# Patient Record
Sex: Female | Born: 1970 | Race: Black or African American | Hispanic: No | Smoking: Former smoker
Health system: Southern US, Community
[De-identification: ages and names within clinical notes are randomized; demographics above are authoritative.]

## PROBLEM LIST (undated history)

## (undated) DIAGNOSIS — K219 Gastro-esophageal reflux disease without esophagitis: Secondary | ICD-10-CM

## (undated) DIAGNOSIS — I10 Essential (primary) hypertension: Secondary | ICD-10-CM

## (undated) DIAGNOSIS — G43909 Migraine, unspecified, not intractable, without status migrainosus: Secondary | ICD-10-CM

## (undated) DIAGNOSIS — R310 Gross hematuria: Secondary | ICD-10-CM

## (undated) DIAGNOSIS — R12 Heartburn: Secondary | ICD-10-CM

## (undated) DIAGNOSIS — K5790 Diverticulosis of intestine, part unspecified, without perforation or abscess without bleeding: Secondary | ICD-10-CM

## (undated) DIAGNOSIS — J45909 Unspecified asthma, uncomplicated: Secondary | ICD-10-CM

## (undated) HISTORY — PX: CARPAL TUNNEL RELEASE: SHX101

## (undated) HISTORY — PX: CHOLECYSTECTOMY: SHX55

## (undated) HISTORY — DX: Heartburn: R12

## (undated) HISTORY — DX: Gross hematuria: R31.0

## (undated) HISTORY — DX: Unspecified asthma, uncomplicated: J45.909

## (undated) HISTORY — PX: ABDOMINAL HYSTERECTOMY: SHX81

---

## 2004-10-14 ENCOUNTER — Ambulatory Visit: Payer: Self-pay | Admitting: Orthopedic Surgery

## 2004-11-14 ENCOUNTER — Ambulatory Visit: Payer: Self-pay | Admitting: Orthopedic Surgery

## 2005-10-19 ENCOUNTER — Observation Stay: Payer: Self-pay | Admitting: Obstetrics and Gynecology

## 2005-11-04 ENCOUNTER — Observation Stay: Payer: Self-pay

## 2005-11-08 ENCOUNTER — Inpatient Hospital Stay: Payer: Self-pay | Admitting: Obstetrics and Gynecology

## 2005-12-22 ENCOUNTER — Ambulatory Visit: Payer: Self-pay | Admitting: Obstetrics and Gynecology

## 2006-11-26 ENCOUNTER — Ambulatory Visit: Payer: Self-pay | Admitting: Certified Nurse Midwife

## 2010-02-01 ENCOUNTER — Emergency Department (HOSPITAL_COMMUNITY): Admission: EM | Admit: 2010-02-01 | Discharge: 2010-02-01 | Payer: Self-pay | Admitting: Emergency Medicine

## 2010-02-08 ENCOUNTER — Inpatient Hospital Stay (HOSPITAL_COMMUNITY): Admission: EM | Admit: 2010-02-08 | Discharge: 2010-02-09 | Payer: Self-pay | Admitting: Emergency Medicine

## 2010-10-01 LAB — DIFFERENTIAL
Basophils Relative: 0 % (ref 0–1)
Eosinophils Absolute: 0.2 10*3/uL (ref 0.0–0.7)
Eosinophils Absolute: 0.3 10*3/uL (ref 0.0–0.7)
Eosinophils Relative: 0 % (ref 0–5)
Eosinophils Relative: 2 % (ref 0–5)
Eosinophils Relative: 2 % (ref 0–5)
Lymphocytes Relative: 14 % (ref 12–46)
Lymphocytes Relative: 5 % — ABNORMAL LOW (ref 12–46)
Lymphs Abs: 1 10*3/uL (ref 0.7–4.0)
Lymphs Abs: 1.6 10*3/uL (ref 0.7–4.0)
Monocytes Absolute: 0.1 10*3/uL (ref 0.1–1.0)
Monocytes Absolute: 0.4 10*3/uL (ref 0.1–1.0)
Monocytes Relative: 1 % — ABNORMAL LOW (ref 3–12)
Monocytes Relative: 2 % — ABNORMAL LOW (ref 3–12)
Monocytes Relative: 6 % (ref 3–12)
Neutro Abs: 8.9 10*3/uL — ABNORMAL HIGH (ref 1.7–7.7)
Neutrophils Relative %: 79 % — ABNORMAL HIGH (ref 43–77)

## 2010-10-01 LAB — SALICYLATE LEVEL: Salicylate Lvl: <4 mg/dL (ref 2.8–20.0)

## 2010-10-01 LAB — POCT CARDIAC MARKERS
CKMB, poc: <1 ng/mL — ABNORMAL LOW (ref 1.0–8.0)
Troponin i, poc: <0.05 ng/mL (ref 0.00–0.09)
Troponin i, poc: <0.05 ng/mL (ref 0.00–0.09)

## 2010-10-01 LAB — BASIC METABOLIC PANEL
BUN: 13 mg/dL (ref 6–23)
BUN: 14 mg/dL (ref 6–23)
CO2: 27 mEq/L (ref 19–32)
Calcium: 9 mg/dL (ref 8.4–10.5)
Chloride: 103 mEq/L (ref 96–112)
Chloride: 106 mEq/L (ref 96–112)
Chloride: 107 mEq/L (ref 96–112)
Creatinine, Ser: 0.78 mg/dL (ref 0.4–1.2)
GFR calc Af Amer: >60 mL/min (ref >60)
GFR calc Af Amer: >60 mL/min (ref >60)
Glucose, Bld: 114 mg/dL — ABNORMAL HIGH (ref 70–99)
Potassium: 3.6 mEq/L (ref 3.5–5.1)
Sodium: 136 mEq/L (ref 135–145)

## 2010-10-01 LAB — MRSA PCR SCREENING: MRSA by PCR: NEGATIVE

## 2010-10-01 LAB — CBC
HCT: 31.7 % — ABNORMAL LOW (ref 36.0–46.0)
Hemoglobin: 10.6 g/dL — ABNORMAL LOW (ref 12.0–15.0)
MCH: 28.3 pg (ref 26.0–34.0)
MCH: 28.3 pg (ref 26.0–34.0)
MCHC: 33.7 g/dL (ref 30.0–36.0)
MCV: 83.7 fL (ref 78.0–100.0)
MCV: 84.1 fL (ref 78.0–100.0)
MCV: 84.5 fL (ref 78.0–100.0)
Platelets: 308 10*3/uL (ref 150–400)
RBC: 3.75 MIL/uL — ABNORMAL LOW (ref 3.87–5.11)
RBC: 3.94 MIL/uL (ref 3.87–5.11)
WBC: 11.3 10*3/uL — ABNORMAL HIGH (ref 4.0–10.5)
WBC: 13.8 10*3/uL — ABNORMAL HIGH (ref 4.0–10.5)
WBC: 22.6 10*3/uL — ABNORMAL HIGH (ref 4.0–10.5)

## 2010-10-01 LAB — URINE CULTURE

## 2010-10-01 LAB — GLUCOSE, CAPILLARY: Glucose-Capillary: 212 mg/dL — ABNORMAL HIGH (ref 70–99)

## 2010-10-01 LAB — BLOOD GAS, ARTERIAL
Acid-base deficit: 3.5 mmol/L — ABNORMAL HIGH (ref 0.0–2.0)
Bicarbonate: 20.3 mEq/L (ref 20.0–24.0)
O2 Content: 21 L/min
O2 Saturation: 95.2 %
Patient temperature: 37
pH, Arterial: 7.413 — ABNORMAL HIGH (ref 7.350–7.400)
pO2, Arterial: 72.9 mmHg — ABNORMAL LOW (ref 80.0–100.0)

## 2010-10-01 LAB — URINALYSIS, ROUTINE W REFLEX MICROSCOPIC
Bilirubin Urine: NEGATIVE
Urobilinogen, UA: 0.2 mg/dL (ref 0.0–1.0)
pH: 5.5 (ref 5.0–8.0)

## 2010-10-01 LAB — RAPID URINE DRUG SCREEN, HOSP PERFORMED
Amphetamines: NOT DETECTED
Cocaine: NOT DETECTED

## 2010-10-01 LAB — URINE MICROSCOPIC-ADD ON

## 2010-10-01 LAB — ACETAMINOPHEN LEVEL: Acetaminophen (Tylenol), Serum: <10 ug/mL — ABNORMAL LOW (ref 10–30)

## 2010-10-01 LAB — D-DIMER, QUANTITATIVE: D-Dimer, Quant: 1.17 ug/mL-FEU — ABNORMAL HIGH (ref 0.00–0.48)

## 2011-01-16 ENCOUNTER — Emergency Department (HOSPITAL_COMMUNITY)
Admission: EM | Admit: 2011-01-16 | Discharge: 2011-01-17 | Disposition: A | Discharge status: Home / Self Care | Payer: Medicaid Other | Attending: Emergency Medicine | Admitting: Emergency Medicine

## 2011-01-16 DIAGNOSIS — R109 Unspecified abdominal pain: Secondary | ICD-10-CM | POA: Insufficient documentation

## 2011-01-16 DIAGNOSIS — Z79899 Other long term (current) drug therapy: Secondary | ICD-10-CM | POA: Insufficient documentation

## 2011-01-16 LAB — CBC
MCH: 27.3 pg (ref 26.0–34.0)
Platelets: 349 10*3/uL (ref 150–400)
RBC: 4.18 MIL/uL (ref 3.87–5.11)
RDW: 14.6 % (ref 11.5–15.5)
WBC: 8.7 10*3/uL (ref 4.0–10.5)

## 2011-01-16 LAB — DIFFERENTIAL
Basophils Relative: 0 % (ref 0–1)
Eosinophils Absolute: 0.2 10*3/uL (ref 0.0–0.7)
Eosinophils Relative: 2 % (ref 0–5)
Neutrophils Relative %: 56 % (ref 43–77)

## 2011-01-16 LAB — BASIC METABOLIC PANEL
Chloride: 102 mEq/L (ref 96–112)
Creatinine, Ser: 0.88 mg/dL (ref 0.50–1.10)
GFR calc Af Amer: >60 mL/min (ref >60)
GFR calc non Af Amer: >60 mL/min (ref >60)

## 2011-01-16 LAB — URINALYSIS, ROUTINE W REFLEX MICROSCOPIC
Bilirubin Urine: NEGATIVE
Ketones, ur: NEGATIVE mg/dL
Leukocytes, UA: NEGATIVE
Nitrite: NEGATIVE
Protein, ur: NEGATIVE mg/dL
Urobilinogen, UA: 0.2 mg/dL (ref 0.0–1.0)
pH: 6 (ref 5.0–8.0)

## 2011-01-16 LAB — POCT PREGNANCY, URINE: Preg Test, Ur: NEGATIVE

## 2011-01-16 LAB — URINE MICROSCOPIC-ADD ON

## 2011-08-08 ENCOUNTER — Ambulatory Visit: Payer: Self-pay | Admitting: Obstetrics and Gynecology

## 2011-08-08 LAB — BASIC METABOLIC PANEL
Anion Gap: 7 (ref 7–16)
Calcium, Total: 8.6 mg/dL (ref 8.5–10.1)
Chloride: 105 mmol/L (ref 98–107)
Co2: 28 mmol/L (ref 21–32)
Glucose: 106 mg/dL — ABNORMAL HIGH (ref 65–99)
Potassium: 4 mmol/L (ref 3.5–5.1)
Sodium: 140 mmol/L (ref 136–145)

## 2011-08-08 LAB — HEMOGLOBIN: HGB: 10.9 g/dL — ABNORMAL LOW (ref 12.0–16.0)

## 2011-08-14 ENCOUNTER — Ambulatory Visit: Payer: Self-pay | Admitting: Obstetrics and Gynecology

## 2011-08-14 HISTORY — PX: TOTAL VAGINAL HYSTERECTOMY: SHX2548

## 2011-08-14 LAB — PREGNANCY, URINE: Pregnancy Test, Urine: NEGATIVE m[IU]/mL

## 2011-08-15 LAB — BASIC METABOLIC PANEL
Chloride: 101 mmol/L (ref 98–107)
Co2: 27 mmol/L (ref 21–32)
Glucose: 102 mg/dL — ABNORMAL HIGH (ref 65–99)
Potassium: 3.8 mmol/L (ref 3.5–5.1)

## 2011-08-16 LAB — PATHOLOGY REPORT

## 2014-11-08 NOTE — Discharge Summary (Signed)
PATIENT NAME:  Mckenzie Anderson, Mckenzie Anderson MR#:  [id number] DATE OF BIRTH:  November 02, 1970  DATE OF ADMISSION:  08/14/2011 DATE OF DISCHARGE:  08/15/2011  PRINCIPLE PROCEDURE: Total vaginal hysterectomy.   HOSPITAL COURSE: The patient underwent the above procedure which was uncomplicated. Postoperative day number one hematocrit was 29.7% with BUN and creatinine of 8 and 0.8, respectively. Good urine output. Vital signs stable. No bleeding. The patient is discharged to home in good condition.   DISCHARGE MEDICATIONS:  1. Norco 5/325 mg 1 to 2 tablets every 3 to 4 hours as needed for pain.  2. Phenergan 25 mg every 4 to 6 hours as needed for nausea and vomiting.   DISCHARGE INSTRUCTIONS/FOLLOWUP: The patient will follow-up with Dr. Feliberto GottronSchermerhorn in two weeks or before if she has heavy vaginal bleeding, nausea, vomiting, fever, or increasing abdominal pain.  ____________________________ Suzy Bouchardhomas J. Naiomi Musto, MD tjs:slb D: 08/15/2011 08:42:23 ET T: 08/15/2011 13:11:20 ET JOB#: [id number]  cc: Suzy Bouchardhomas J. Yuto Cajuste, MD, <Dictator> Suzy BouchardHOMAS J Devery Odwyer MD ELECTRONICALLY SIGNED 08/23/2011 9:24

## 2014-11-08 NOTE — Op Note (Signed)
PATIENT NAME:  [name], Mckenzie Anderson MR#:  [id number] DATE OF BIRTH:  [birth year]  DATE OF PROCEDURE:  08/14/2011  PREOPERATIVE DIAGNOSIS:  Menorrhagia, two fibroids.   POSTOPERATIVE DIAGNOSIS: Menorrhagia, two fibroids.    PROCEDURE: Total vaginal hysterectomy.   SURGEON: Mckenzie Anderson, M.D.   FIRST ASSISTANT: Madelin Headingsick Evans, M.D.   ANESTHESIA: General endotracheal.   INDICATIONS: This is a 44 year old gravida 6, para 3 patient with a long history of menorrhagia with large clot formation. The patient's ultrasound demonstrates several fibroids. The patient has elected for permanent definitive treatment and desires no future fertility.   DESCRIPTION OF PROCEDURE: After adequate general endotracheal anesthesia, the patient was placed in the dorsal supine position with legs placed in the candy-cane stirrups. Lower abdomen, perineal and vagina were prepped with Betadine. The patient had previously received 2 grams IV cefoxitin prior to commencement of the case. The cervix was then circumferentially injected with 1% lidocaine with 1:100,000 epinephrine. A direct posterior colpotomy incision was made. Upon entry into the posterior cul-de-sac, long-billed weighted speculum placed. The uterosacral ligaments were bilaterally clamped, transected and suture ligated with 0 Vicryl suture and tagged for later identification. The cervix was then circumferentially incised with the Bovie. The anterior cul-de-sac entered sharply. Deaver retractor was placed into the anterior cul-de-sac to reflect the bladder anteriorly. Cardinal ligaments were then bilaterally clamped, transected, and suture ligated with 0 Vicryl suture. Uterine arteries were bilaterally clamped, transected, and suture ligated with 0 Vicryl suture. Given the girth of the uterus, the cervix and portions of the myometrium were cored out to aid in decompression of the uterus. Ultimately, the cornua bilaterally were clamped, transected and the uterus was  delivered. Both cornual pedicles were doubly ligated with 0 Vicryl suture. Ovaries were generous in size, but appeared normal. The peritoneum was then closed with a 2-0 PDS suture in a pursestring fashion and the vaginal cuff was closed with a 0 Vicryl suture in a running nonlocking fashion. Good hemostasis was noted. No complications.   Of note, the patient's bladder was catheterized prior to commencement of the case yielding 150 milliliters and Foley catheter was placed at the end of the case yielding another 25 mL of urine. The patient was taken to the recovery room in good condition.   ESTIMATED BLOOD LOSS: 300 mL.  INTRAOPERATIVE FLUIDS: 1000 mL.   ____________________________ Mckenzie Bouchardhomas J. Mikell Camp, Mckenzie Anderson tjs:ap D: 08/14/2011 10:39:29 ET T: 08/14/2011 10:50:45 ET JOB#: [id number]  cc: Mckenzie Bouchardhomas J. [name], Mckenzie Anderson, <Dictator> Mckenzie Anderson ELECTRONICALLY SIGNED 08/15/2011 8:46

## 2015-02-03 ENCOUNTER — Emergency Department (HOSPITAL_COMMUNITY): Payer: Managed Care, Other (non HMO)

## 2015-02-03 ENCOUNTER — Encounter (HOSPITAL_COMMUNITY): Payer: Self-pay | Admitting: Emergency Medicine

## 2015-02-03 ENCOUNTER — Emergency Department (HOSPITAL_COMMUNITY)
Admission: EM | Admit: 2015-02-03 | Discharge: 2015-02-03 | Disposition: A | Discharge status: Home / Self Care | Payer: Managed Care, Other (non HMO) | Attending: Emergency Medicine | Admitting: Emergency Medicine

## 2015-02-03 DIAGNOSIS — R51 Headache: Secondary | ICD-10-CM | POA: Diagnosis not present

## 2015-02-03 DIAGNOSIS — G44209 Tension-type headache, unspecified, not intractable: Secondary | ICD-10-CM

## 2015-02-03 DIAGNOSIS — R519 Headache, unspecified: Secondary | ICD-10-CM

## 2015-02-03 DIAGNOSIS — Z720 Tobacco use: Secondary | ICD-10-CM | POA: Diagnosis not present

## 2015-02-03 DIAGNOSIS — Z8679 Personal history of other diseases of the circulatory system: Secondary | ICD-10-CM | POA: Diagnosis not present

## 2015-02-03 DIAGNOSIS — R42 Dizziness and giddiness: Secondary | ICD-10-CM | POA: Diagnosis not present

## 2015-02-03 DIAGNOSIS — I10 Essential (primary) hypertension: Secondary | ICD-10-CM

## 2015-02-03 HISTORY — DX: Essential (primary) hypertension: I10

## 2015-02-03 HISTORY — DX: Migraine, unspecified, not intractable, without status migrainosus: G43.909

## 2015-02-03 MED ORDER — ORPHENADRINE CITRATE ER 100 MG PO TB12: 100.0000 mg | ORAL_TABLET | Freq: Two times a day (BID) | ORAL | Status: DC

## 2015-02-03 MED ORDER — SODIUM CHLORIDE 0.9 % IV SOLN
1000.0000 mL | INTRAVENOUS | Status: DC
Start: 2015-02-03 — End: 2015-02-03
  Administered 2015-02-03: 1000 mL via INTRAVENOUS

## 2015-02-03 MED ORDER — SODIUM CHLORIDE 0.9 % IV SOLN
1000.0000 mL | Freq: Once | INTRAVENOUS | Status: AC
  Administered 2015-02-03: 1000 mL via INTRAVENOUS

## 2015-02-03 MED ORDER — KETOROLAC TROMETHAMINE 30 MG/ML IJ SOLN
30.0000 mg | Freq: Once | INTRAMUSCULAR | Status: AC
  Administered 2015-02-03: 30 mg via INTRAVENOUS
  Filled 2015-02-03: qty 1

## 2015-02-03 MED ORDER — PROCHLORPERAZINE EDISYLATE 5 MG/ML IJ SOLN
10.0000 mg | Freq: Once | INTRAMUSCULAR | Status: AC
  Administered 2015-02-03: 10 mg via INTRAVENOUS
  Filled 2015-02-03: qty 2

## 2015-02-03 MED ORDER — DIPHENHYDRAMINE HCL 50 MG/ML IJ SOLN
25.0000 mg | Freq: Once | INTRAMUSCULAR | Status: AC
  Administered 2015-02-03: 25 mg via INTRAVENOUS
  Filled 2015-02-03: qty 1

## 2015-02-03 MED ORDER — METOCLOPRAMIDE HCL 5 MG/ML IJ SOLN
10.0000 mg | Freq: Once | INTRAMUSCULAR | Status: AC
  Administered 2015-02-03: 10 mg via INTRAVENOUS
  Filled 2015-02-03: qty 2

## 2015-02-03 NOTE — ED Notes (Signed)
Pt c/o headache in back of head. Pt states the pain has been going on a while but keeps getting worse. Pt states this pain is different than her normal migraines.

## 2015-02-03 NOTE — Discharge Instructions (Signed)
Tension Headache A tension headache is a feeling of pain, pressure, or aching often felt over the front and sides of the head. The pain can be dull or can feel tight (constricting). It is the most common type of headache. Tension headaches are not normally associated with nausea or vomiting and do not get worse with physical activity. Tension headaches can last 30 minutes to several days.  CAUSES  The exact cause is not known, but it may be caused by chemicals and hormones in the brain that lead to pain. Tension headaches often begin after stress, anxiety, or depression. Other triggers may include:  Alcohol.  Caffeine (too much or withdrawal).  Respiratory infections (colds, flu, sinus infections).  Dental problems or teeth clenching.  Fatigue.  Holding your head and neck in one position too long while using a computer. SYMPTOMS   Pressure around the head.   Dull, aching head pain.   Pain felt over the front and sides of the head.   Tenderness in the muscles of the head, neck, and shoulders. DIAGNOSIS  A tension headache is often diagnosed based on:   Symptoms.   Physical examination.   A CT scan or MRI of your head. These tests may be ordered if symptoms are severe or unusual. TREATMENT  Medicines may be given to help relieve symptoms.  HOME CARE INSTRUCTIONS   Only take over-the-counter or prescription medicines for pain or discomfort as directed by your caregiver.   Lie down in a dark, quiet room when you have a headache.   Keep a journal to find out what may be triggering your headaches. For example, write down:  What you eat and drink.  How much sleep you get.  Any change to your diet or medicines.  Try massage or other relaxation techniques.   Ice packs or heat applied to the head and neck can be used. Use these 3 to 4 times per day for 15 to 20 minutes each time, or as needed.   Limit stress.   Sit up straight, and do not tense your muscles.    Quit smoking if you smoke.  Limit alcohol use.  Decrease the amount of caffeine you drink, or stop drinking caffeine.  Eat and exercise regularly.  Get 7 to 9 hours of sleep, or as recommended by your caregiver.  Avoid excessive use of pain medicine as recurrent headaches can occur.  SEEK MEDICAL CARE IF:   You have problems with the medicines you were prescribed.  Your medicines do not work.  You have a change from the usual headache.  You have nausea or vomiting. SEEK IMMEDIATE MEDICAL CARE IF:   Your headache becomes severe.  You have a fever.  You have a stiff neck.  You have loss of vision.  You have muscular weakness or loss of muscle control.  You lose your balance or have trouble walking.  You feel faint or pass out.  You have severe symptoms that are different from your first symptoms. MAKE SURE YOU:   Understand these instructions.  Will watch your condition.  Will get help right away if you are not doing well or get worse. Document Released: 07/03/2005 Document Revised: 09/25/2011 Document Reviewed: 06/23/2011 [facility] Patient Information 2015 [place], [place]. This information is not intended to replace advice given to you by your health care provider. Make sure you discuss any questions you have with your health care provider.  Orphenadrine tablets What is this medicine? ORPHENADRINE (or FEN a dreen) helps  to relieve pain and stiffness in muscles and can treat muscle spasms. This medicine may be used for other purposes; ask your health care provider or pharmacist if you have questions. COMMON BRAND NAME(S): Norflex What should I tell my health care provider before I take this medicine? They need to know if you have any of these conditions: -glaucoma -heart disease -kidney disease -myasthenia gravis -peptic ulcer disease -prostate disease -stomach problems -an unusual or allergic reaction to orphenadrine, other medicines, foods, lactose,  dyes, or preservatives -pregnant or trying to get pregnant -breast-feeding How should I use this medicine? Take this medicine by mouth with a full glass of water. Follow the directions on the prescription label. Take your medicine at regular intervals. Do not take your medicine more often than directed. Do not take more than you are told to take. Talk to your pediatrician regarding the use of this medicine in children. Special care may be needed. Patients over 38 years old may have a stronger reaction and need a smaller dose. Overdosage: If you think you have taken too much of this medicine contact a poison control center or emergency room at once. NOTE: This medicine is only for you. Do not share this medicine with others. What if I miss a dose? If you miss a dose, take it as soon as you can. If it is almost time for your next dose, take only that dose. Do not take double or extra doses. What may interact with this medicine? -alcohol -antihistamines -barbiturates, like phenobarbital -benzodiazepines -cyclobenzaprine -medicines for pain -phenothiazines like chlorpromazine, mesoridazine, prochlorperazine, thioridazine This list may not describe all possible interactions. Give your health care provider a list of all the medicines, herbs, non-prescription drugs, or dietary supplements you use. Also tell them if you smoke, drink alcohol, or use illegal drugs. Some items may interact with your medicine. What should I watch for while using this medicine? Your mouth may get dry. Chewing sugarless gum or sucking hard candy, and drinking plenty of water may help. Contact your doctor if the problem does not go away or is severe. This medicine may cause dry eyes and blurred vision. If you wear contact lenses you may feel some discomfort. Lubricating drops may help. See your eye doctor if the problem does not go away or is severe. You may get drowsy or dizzy. Do not drive, use machinery, or do anything  that needs mental alertness until you know how this medicine affects you. Do not stand or sit up quickly, especially if you are an older patient. This reduces the risk of dizzy or fainting spells. Alcohol may interfere with the effect of this medicine. Avoid alcoholic drinks. What side effects may I notice from receiving this medicine? Side effects that you should report to your doctor or health care professional as soon as possible: -allergic reactions like skin rash, itching or hives, swelling of the face, lips, or tongue -changes in vision -difficulty breathing -fast heartbeat or palpitations -hallucinations -light headedness, fainting spells -vomiting Side effects that usually do not require medical attention (report to your doctor or health care professional if they continue or are bothersome): -dizziness -drowsiness -headache -nausea This list may not describe all possible side effects. Call your doctor for medical advice about side effects. You may report side effects to FDA at 1-800-FDA-1088. Where should I keep my medicine? Keep out of the reach of children. Store at room temperature between 15 and 30 degrees C (59 and 86 degrees F). Protect from light.  Keep container tightly closed. Throw away any unused medicine after the expiration date. NOTE: This sheet is a summary. It may not cover all possible information. If you have questions about this medicine, talk to your doctor, pharmacist, or health care provider.  2015, Elsevier/Gold Standard. (2008-01-28 17:19:12)

## 2015-02-03 NOTE — ED Provider Notes (Signed)
CSN: [id number]     Arrival date & time 02/03/15  0022 History  This chart was scribed for Dione Boozeavid Royalti Schauf, MD by Budd PalmerVanessa Prueter, ED Scribe. This patient was seen in room APA06/APA06 and the patient's care was started at 12:32 AM.    Chief Complaint  Patient presents with  . Headache   The history is provided by the patient. No language interpreter was used.   HPI Comments: Mckenzie Anderson is a 44 y.o. female with a PMHx of migraine and HTN who presents to the Emergency Department complaining of a constant, worsening HA in the back of the head onset 1 day ago. She rates her pain as an 8/10. She notes associated dizziness and lightheadedness. She states the pain is exacerbated by loud noise. She reports that this pain feels different from her usual migraines. Pt denies nausea  Past Medical History  Diagnosis Date  . Hypertension   . Migraines    Past Surgical History  Procedure Laterality Date  . Abdominal hysterectomy    . Cholecystectomy    . Carpal tunnel release     History reviewed. No pertinent family history. History  Substance Use Topics  . Smoking status: Current Every Day Smoker    Types: Cigarettes  . Smokeless tobacco: Not on file  . Alcohol Use: No   OB History    No data available     Review of Systems  Neurological: Positive for light-headedness.  All other systems reviewed and are negative.   Allergies  Sulfa antibiotics  Home Medications   Prior to Admission medications   Medication Sig Start Date End Date Taking? Authorizing Provider  phentermine 37.5 MG capsule Take 37.5 mg by mouth every morning.   Yes Historical Provider, MD   There were no vitals taken for this visit. Physical Exam  Constitutional: She is oriented to person, place, and time. She appears well-developed and well-nourished. No distress.  HENT:  Head: Normocephalic and atraumatic.  Mouth/Throat: Oropharynx is clear and moist.  Moderate TTP insertion of the paracervical muscles  bilaterally  Eyes: EOM are normal. Pupils are equal, round, and reactive to light.  Fundi are normal  Neck: Normal range of motion. Neck supple. No JVD present.  Cardiovascular: Normal rate, regular rhythm and normal heart sounds.   No murmur heard. Pulmonary/Chest: Effort normal and breath sounds normal. No respiratory distress. She has no wheezes. She has no rales. She exhibits no tenderness.  Abdominal: Soft. Bowel sounds are normal. She exhibits no distension and no mass. There is no tenderness.  Musculoskeletal: Normal range of motion. She exhibits no edema.  Lymphadenopathy:    She has no cervical adenopathy.  Neurological: She is alert and oriented to person, place, and time. No cranial nerve deficit. She exhibits normal muscle tone. Coordination normal.  Skin: Skin is warm and dry. No rash noted.  Psychiatric: She has a normal mood and affect. Her behavior is normal. Judgment and thought content normal.  Nursing note and vitals reviewed.   ED Course  Procedures  DIAGNOSTIC STUDIES: Oxygen Saturation is 100% on RA, normal by my interpretation.    COORDINATION OF CARE: 12:36 AM - Discussed plans to give tension HA medication. Pt advised of plan for treatment and pt agrees.   Imaging Review Ct Head Wo Contrast  02/03/2015   CLINICAL DATA:  Posterior headaches for 1 day. Dizziness. Initial encounter.  EXAM: CT HEAD WITHOUT CONTRAST  TECHNIQUE: Contiguous axial images were obtained from the base of the skull  through the vertex without intravenous contrast.  COMPARISON:  None.  FINDINGS: There is no evidence of acute infarction, mass lesion, or intra- or extra-axial hemorrhage on CT.  The posterior fossa, including the cerebellum, brainstem and fourth ventricle, is within normal limits. The third and lateral ventricles, and basal ganglia are unremarkable in appearance. The cerebral hemispheres are symmetric in appearance, with normal gray-white differentiation. No mass effect or midline  shift is seen.  There is no evidence of fracture; visualized osseous structures are unremarkable in appearance. The visualized portions of the orbits are within normal limits. The paranasal sinuses and mastoid air cells are well-aerated. No significant soft tissue abnormalities are seen.  IMPRESSION: Unremarkable noncontrast CT of the head.   Electronically Signed   By: Roanna Raider M.D.   On: 02/03/2015 03:20    MDM   Final diagnoses:  Headache, unspecified headache type  Muscle contraction headache  Essential hypertension    Headache which seems to be a muscle contraction headache. No red flags to suspect more serious causes of headache. Blood pressure is noted to be elevated but probably not sufficiently high to cause headache. She is given a headache cocktail of normal saline, metoclopramide, diphenhydramine but states that she is not feeling any better following this. Because of failure to respond, CT scan will be obtained and she is even prochlorperazine and ketorolac to see if he can get headache under better control.  She had excellent relief of headache with the prochlorperazine and ketorolac. CT is unremarkable. Blood pressure has come down with control of headache although is still elevated. She is discharged with prescription for orphenadrine and is to follow-up with family doctor.  I personally performed the services described in this documentation, which was scribed in my presence. The recorded information has been reviewed and is accurate.     Dione Booze, MD 02/03/15 [phone]

## 2015-12-28 ENCOUNTER — Encounter (HOSPITAL_COMMUNITY): Payer: Self-pay

## 2015-12-28 ENCOUNTER — Emergency Department (HOSPITAL_COMMUNITY)
Admission: EM | Admit: 2015-12-28 | Discharge: 2015-12-29 | Disposition: A | Discharge status: Home / Self Care | Payer: BLUE CROSS/BLUE SHIELD | Attending: Emergency Medicine | Admitting: Emergency Medicine

## 2015-12-28 ENCOUNTER — Emergency Department (HOSPITAL_COMMUNITY): Payer: BLUE CROSS/BLUE SHIELD

## 2015-12-28 DIAGNOSIS — R252 Cramp and spasm: Secondary | ICD-10-CM

## 2015-12-28 DIAGNOSIS — R55 Syncope and collapse: Secondary | ICD-10-CM | POA: Insufficient documentation

## 2015-12-28 DIAGNOSIS — Z79899 Other long term (current) drug therapy: Secondary | ICD-10-CM | POA: Insufficient documentation

## 2015-12-28 DIAGNOSIS — F17210 Nicotine dependence, cigarettes, uncomplicated: Secondary | ICD-10-CM | POA: Diagnosis not present

## 2015-12-28 DIAGNOSIS — I10 Essential (primary) hypertension: Secondary | ICD-10-CM | POA: Diagnosis not present

## 2015-12-28 LAB — BASIC METABOLIC PANEL
Anion gap: 8 (ref 5–15)
BUN: 15 mg/dL (ref 6–20)
CHLORIDE: 99 mmol/L — AB (ref 101–111)
CO2: 26 mmol/L (ref 22–32)
CREATININE: 0.95 mg/dL (ref 0.44–1.00)
Calcium: 9.5 mg/dL (ref 8.9–10.3)
GFR calc Af Amer: >60 mL/min (ref >60)
GFR calc non Af Amer: >60 mL/min (ref >60)
Glucose, Bld: 95 mg/dL (ref 65–99)
POTASSIUM: 3.7 mmol/L (ref 3.5–5.1)
Sodium: 133 mmol/L — ABNORMAL LOW (ref 135–145)

## 2015-12-28 LAB — CBC
HEMATOCRIT: 41.3 % (ref 36.0–46.0)
Hemoglobin: 13.8 g/dL (ref 12.0–15.0)
MCH: 30.2 pg (ref 26.0–34.0)
MCHC: 33.4 g/dL (ref 30.0–36.0)
MCV: 90.4 fL (ref 78.0–100.0)
PLATELETS: 329 10*3/uL (ref 150–400)
RBC: 4.57 MIL/uL (ref 3.87–5.11)
RDW: 13.4 % (ref 11.5–15.5)
WBC: 12.1 10*3/uL — AB (ref 4.0–10.5)

## 2015-12-28 MED ORDER — SODIUM CHLORIDE 0.9 % IV BOLUS (SEPSIS)
1000.0000 mL | Freq: Once | INTRAVENOUS | Status: AC
  Administered 2015-12-28: 1000 mL via INTRAVENOUS

## 2015-12-28 NOTE — ED Provider Notes (Signed)
CSN: [id number]     Arrival date & time 12/28/15  1741 History   First MD Initiated Contact with Patient 12/28/15 2114     Chief Complaint  Patient presents with  . possible seizure      HPI Pt was at home and she felt her legs cramping.  She started to feel very hot.  She heard a ringing in her ears.  The next thing she heard was daughter calling out for her.  Daughter noticed that she gasped and reached for her.  SHe was shaking all over.  That lasted 2 minutes and stopped.  She started asking for water immediately after waking up.  No history of seizures.  No fevers.  No recent injuries.  No rashes. Past Medical History  Diagnosis Date  . Hypertension   . Migraines    Past Surgical History  Procedure Laterality Date  . Abdominal hysterectomy    . Cholecystectomy    . Carpal tunnel release     No family history on file. Social History  Substance Use Topics  . Smoking status: Current Every Day Smoker    Types: Cigarettes  . Smokeless tobacco: None  . Alcohol Use: No   OB History    No data available     Review of Systems  All other systems reviewed and are negative.     Allergies  Sulfa antibiotics  Home Medications   Prior to Admission medications   Medication Sig Start Date End Date Taking? Authorizing Provider  ALBUTEROL IN    Yes Historical Provider, MD  Beclomethasone Dipropionate (QVAR IN)    Yes Historical Provider, MD  losartan-hydrochlorothiazide (HYZAAR) 100-12.5 MG tablet Take 1 tablet by mouth daily. 12/07/15  Yes Historical Provider, MD  naproxen sodium (ALEVE) 220 MG tablet Take 220-880 mg by mouth every 12 (twelve) hours as needed (for headaches).   Yes Historical Provider, MD  phentermine 37.5 MG capsule Take 37.5 mg by mouth See admin instructions. One-half to one tablet daily as needed   Yes Historical Provider, MD  Topiramate (TOPAMAX PO)    Yes Historical Provider, MD  chlorpheniramine-HYDROcodone (TUSSIONEX) 10-8 MG/5ML SUER Take 5 mLs by mouth  every 12 (twelve) hours as needed. 10/28/15   Historical Provider, MD  naproxen (NAPROSYN) 500 MG tablet Take 1 tablet (500 mg total) by mouth 2 (two) times daily with a meal. 12/29/15   Linwood Dibbles, MD  orphenadrine (NORFLEX) 100 MG tablet Take 1 tablet (100 mg total) by mouth 2 (two) times daily. Patient not taking: Reported on 12/28/2015 02/03/15   Dione Booze, MD   BP 133/92 mmHg  Pulse 58  Temp(Src) 98.2 F (36.8 C) (Oral)  Resp 16  Ht  (1.727 m)  Wt 104.327 kg  BMI 34.98 kg/m2  SpO2 100% Physical Exam  Constitutional: She appears well-developed and well-nourished. No distress.  HENT:  Head: Normocephalic and atraumatic.  Right Ear: External ear normal.  Left Ear: External ear normal.  Eyes: Conjunctivae are normal. Right eye exhibits no discharge. Left eye exhibits no discharge. No scleral icterus.  Neck: Neck supple. No tracheal deviation present.  Cardiovascular: Normal rate, regular rhythm and intact distal pulses.   Pulmonary/Chest: Effort normal and breath sounds normal. No stridor. No respiratory distress. She has no wheezes. She has no rales.  Abdominal: Soft. Bowel sounds are normal. She exhibits no distension. There is no tenderness. There is no rebound and no guarding.  Musculoskeletal: She exhibits no edema or tenderness.  Neurological: She is  alert. She has normal strength. No cranial nerve deficit (no facial droop, extraocular movements intact, no slurred speech) or sensory deficit. She exhibits normal muscle tone. She displays no seizure activity. Coordination normal.  Skin: Skin is warm and dry. No rash noted.  Psychiatric: She has a normal mood and affect.  Nursing note and vitals reviewed.   ED Course  Procedures (including critical care time) Labs Review Labs Reviewed  BASIC METABOLIC PANEL - Abnormal; Notable for the following:    Sodium 133 (*)    Chloride 99 (*)    All other components within normal limits  CBC - Abnormal; Notable for the following:     WBC 12.1 (*)    All other components within normal limits  CBG MONITORING, ED    Imaging Review Ct Head Wo Contrast  12/29/2015  CLINICAL DATA:  Acute onset of seizure. Generalized jerking. Initial encounter. EXAM: CT HEAD WITHOUT CONTRAST TECHNIQUE: Contiguous axial images were obtained from the base of the skull through the vertex without intravenous contrast. COMPARISON:  CT of the head performed 02/03/2015 FINDINGS: There is no evidence of acute infarction, mass lesion, or intra- or extra-axial hemorrhage on CT. The posterior fossa, including the cerebellum, brainstem and fourth ventricle, is within normal limits. The third and lateral ventricles, and basal ganglia are unremarkable in appearance. The cerebral hemispheres are symmetric in appearance, with normal gray-white differentiation. No mass effect or midline shift is seen. There is no evidence of fracture; visualized osseous structures are unremarkable in appearance. The visualized portions of the orbits are within normal limits. The paranasal sinuses and mastoid air cells are well-aerated. No significant soft tissue abnormalities are seen. IMPRESSION: Unremarkable noncontrast CT of the head. Electronically Signed   By: Roanna RaiderJeffery  Chang M.D.   On: 12/29/2015 00:09   I have personally reviewed and evaluated these images and lab results as part of my medical decision-making.   EKG Interpretation   Date/Time:  Tuesday December 28 2015 17:58:59 EDT Ventricular Rate:  86 PR Interval:  150 QRS Duration: 74 QT Interval:  360 QTC Calculation: 430 R Axis:   73 Text Interpretation:  Normal sinus rhythm Normal ECG No significant change  since last tracing Confirmed by Kaylor Simenson  MD-J, Wendell Nicoson ([id number]) on 12/28/2015  9:38:09 PM      MDM   Final diagnoses:  Syncope, unspecified syncope type  Muscle cramps    Possible syncope vs seizures.  Legrand RamsFavor the former based on the lack of post ictal phase.  Will dc home with nsaids for her muscle cramps.  She  was also given IV fluids.  Discussed follow up with a primary care doctor and consider seeing a neurologist for consideration of EEG testing.    Linwood DibblesJon Tashe Purdon, MD 12/29/15 0030

## 2015-12-28 NOTE — ED Notes (Signed)
Pt reports frequent leg cramping onset today, has been using mustard and ice pack without relief. Daughter was massaging pt's leg when pt began having full body jerking for a couple of minutes. Denies incontinence or mouth injury. Pt does have a hx of migraines, c/o mild headache at this time. Heating pack applied to cramp in leg. Pt does not have a neurologist

## 2015-12-28 NOTE — ED Notes (Signed)
Daughter here with patient who states that while she was massaging her leg due to cramp appeared to have a seizure. Family member states that patient laid back in chair and began generalized jerking for 1-2 minutes.  No incontinence, no tongue trauma and was alert per family immediatly post event. No hx of seizures. denies any pain. Alert and oriented

## 2015-12-29 MED ORDER — NAPROXEN 500 MG PO TABS: 500.0000 mg | ORAL_TABLET | Freq: Two times a day (BID) | ORAL | Status: DC

## 2015-12-29 MED ORDER — NAPROXEN 250 MG PO TABS
500.0000 mg | ORAL_TABLET | Freq: Two times a day (BID) | ORAL | Status: DC
  Administered 2015-12-29: 500 mg via ORAL
  Filled 2015-12-29: qty 2

## 2015-12-29 NOTE — Discharge Instructions (Signed)
Muscle Cramps and Spasms Muscle cramps and spasms occur when a muscle or muscles tighten and you have no control over this tightening (involuntary muscle contraction). They are a common problem and can develop in any muscle. The most common place is in the calf muscles of the leg. Both muscle cramps and muscle spasms are involuntary muscle contractions, but they also have differences:   Muscle cramps are sporadic and painful. They may last a few seconds to a quarter of an hour. Muscle cramps are often more forceful and last longer than muscle spasms.  Muscle spasms may or may not be painful. They may also last just a few seconds or much longer. CAUSES  It is uncommon for cramps or spasms to be due to a serious underlying problem. In many cases, the cause of cramps or spasms is unknown. Some common causes are:   Overexertion.   Overuse from repetitive motions (doing the same thing over and over).   Remaining in a certain position for a long period of time.   Improper preparation, form, or technique while performing a sport or activity.   Dehydration.   Injury.   Side effects of some medicines.   Abnormally low levels of the salts and ions in your blood (electrolytes), especially potassium and calcium. This could happen if you are taking water pills (diuretics) or you are pregnant.  Some underlying medical problems can make it more likely to develop cramps or spasms. These include, but are not limited to:   Diabetes.   Parkinson disease.   Hormone disorders, such as thyroid problems.   Alcohol abuse.   Diseases specific to muscles, joints, and bones.   Blood vessel disease where not enough blood is getting to the muscles.  HOME CARE INSTRUCTIONS   Stay well hydrated. Drink enough water and fluids to keep your urine clear or pale yellow.  It may be helpful to massage, stretch, and relax the affected muscle.  For tight or tense muscles, use a warm towel, heating  pad, or hot shower water directed to the affected area.  If you are sore or have pain after a cramp or spasm, applying ice to the affected area may relieve discomfort.  Put ice in a plastic bag.  Place a towel between your skin and the bag.  Leave the ice on for 15-20 minutes, 03-04 times a day.  Medicines used to treat a known cause of cramps or spasms may help reduce their frequency or severity. Only take over-the-counter or prescription medicines as directed by your caregiver. SEEK MEDICAL CSyncope Syncope is a medical term for fainting or passing out. This means you lose consciousness and drop to the ground. People are generally unconscious for less than 5 minutes. You may have some muscle twitches for up to 15 seconds before waking up and returning to normal. Syncope occurs more often in older adults, but it can happen to anyone. While most causes of syncope are not dangerous, syncope can be a sign of a serious medical problem. It is important to seek medical care.  CAUSES  Syncope is caused by a sudden drop in blood flow to the brain. The specific cause is often not determined. Factors that can bring on syncope include:  Taking medicines that lower blood pressure.  Sudden changes in posture, such as standing up quickly.  Taking more medicine than prescribed.  Standing in one place for too long.  Seizure disorders.  Dehydration and excessive exposure to heat.  Low blood sugar (  hypoglycemia).  Straining to have a bowel movement.  Heart disease, irregular heartbeat, or other circulatory problems.  Fear, emotional distress, seeing blood, or severe pain. SYMPTOMS  Right before fainting, you may:  Feel dizzy or light-headed.  Feel nauseous.  See all white or all black in your field of vision.  Have cold, clammy skin. DIAGNOSIS  Your health care provider will ask about your symptoms, perform a physical exam, and perform an electrocardiogram (ECG) to record the electrical  activity of your heart. Your health care provider may also perform other heart or blood tests to determine the cause of your syncope which may include:  Transthoracic echocardiogram (TTE). During echocardiography, sound waves are used to evaluate how blood flows through your heart.  Transesophageal echocardiogram (TEE).  Cardiac monitoring. This allows your health care provider to monitor your heart rate and rhythm in real time.  Holter monitor. This is a portable device that records your heartbeat and can help diagnose heart arrhythmias. It allows your health care provider to track your heart activity for several days, if needed.  Stress tests by exercise or by giving medicine that makes the heart beat faster. TREATMENT  In most cases, no treatment is needed. Depending on the cause of your syncope, your health care provider may recommend changing or stopping some of your medicines. HOME CARE INSTRUCTIONS  Have someone stay with you until you feel stable.  Do not drive, use machinery, or play sports until your health care provider says it is okay.  Keep all follow-up appointments as directed by your health care provider.  Lie down right away if you start feeling like you might faint. Breathe deeply and steadily. Wait until all the symptoms have passed.  Drink enough fluids to keep your urine clear or pale yellow.  If you are taking blood pressure or heart medicine, get up slowly and take several minutes to sit and then stand. This can reduce dizziness. SEEK IMMEDIATE MEDICAL CARE IF:   You have a severe headache.  You have unusual pain in the chest, abdomen, or back.  You are bleeding from your mouth or rectum, or you have black or tarry stool.  You have an irregular or very fast heartbeat.  You have pain with breathing.  You have repeated fainting or seizure-like jerking during an episode.  You faint when sitting or lying down.  You have confusion.  You have trouble  walking.  You have severe weakness.  You have vision problems. If you fainted, call your local emergency services (911 in [place] Do not drive yourself to the hospital.    This information is not intended to replace advice given to you by your health care provider. Make sure you discuss any questions you have with your health care provider.   Document Released: 07/03/2005 Document Revised: 11/17/2014 Document Reviewed: 09/01/2011 Elsevier Interactive Patient Education 2016 Elsevier Inc. ARE IF:  Your cramps or spasms get more severe, more frequent, or do not improve over time.  MAKE SURE YOU:   Understand these instructions.  Will watch your condition.  Will get help right away if you are not doing well or get worse.   This information is not intended to replace advice given to you by your health care provider. Make sure you discuss any questions you have with your health care provider.   Document Released: 12/23/2001 Document Revised: 10/28/2012 Document Reviewed: 06/19/2012 Elsevier Interactive Patient Education [employer] Elsevier Inc.

## 2015-12-29 NOTE — ED Notes (Signed)
Patient left at this time with all belongings. 

## 2016-01-06 ENCOUNTER — Encounter: Payer: Self-pay | Admitting: Neurology

## 2016-01-06 ENCOUNTER — Ambulatory Visit (INDEPENDENT_AMBULATORY_CARE_PROVIDER_SITE_OTHER): Payer: BLUE CROSS/BLUE SHIELD | Admitting: Neurology

## 2016-01-06 VITALS — BP 143/99 | HR 83 | Ht 68.0 in | Wt 242.8 lb

## 2016-01-06 DIAGNOSIS — H539 Unspecified visual disturbance: Secondary | ICD-10-CM | POA: Diagnosis not present

## 2016-01-06 DIAGNOSIS — R51 Headache: Secondary | ICD-10-CM | POA: Diagnosis not present

## 2016-01-06 DIAGNOSIS — I10 Essential (primary) hypertension: Secondary | ICD-10-CM | POA: Diagnosis not present

## 2016-01-06 DIAGNOSIS — G43009 Migraine without aura, not intractable, without status migrainosus: Secondary | ICD-10-CM | POA: Diagnosis not present

## 2016-01-06 DIAGNOSIS — R519 Headache, unspecified: Secondary | ICD-10-CM

## 2016-01-06 DIAGNOSIS — H938X3 Other specified disorders of ear, bilateral: Secondary | ICD-10-CM

## 2016-01-06 DIAGNOSIS — H93A9 Pulsatile tinnitus, unspecified ear: Secondary | ICD-10-CM | POA: Diagnosis not present

## 2016-01-06 DIAGNOSIS — R404 Transient alteration of awareness: Secondary | ICD-10-CM

## 2016-01-06 DIAGNOSIS — R252 Cramp and spasm: Secondary | ICD-10-CM | POA: Diagnosis not present

## 2016-01-06 DIAGNOSIS — R569 Unspecified convulsions: Secondary | ICD-10-CM

## 2016-01-06 MED ORDER — KETOROLAC TROMETHAMINE 60 MG/2ML IM SOLN
60.0000 mg | Freq: Once | INTRAMUSCULAR | Status: AC
  Administered 2016-01-06: 60 mg via INTRAMUSCULAR

## 2016-01-06 MED ORDER — TOPIRAMATE 100 MG PO TABS: 100.0000 mg | ORAL_TABLET | Freq: Every day | ORAL | Status: DC

## 2016-01-06 NOTE — Patient Instructions (Addendum)
Remember to drink plenty of fluid, eat healthy meals and do not skip any meals. Try to eat protein with a every meal and eat a healthy snack such as fruit or nuts in between meals. Try to keep a regular sleep-wake schedule and try to exercise daily, particularly in the form of walking, 20-30 minutes a day, if you can.   As far as your medications are concerned, I would like to suggest: Topiramate: Take 1/2 tablet at bedtime for 1-2 weeks then increase to a while tablet every night.  As far as diagnostic testing: MRI of the brain and blood vessels, EEG  Our phone number is [phone]. We also have an after hours call service for urgent matters and there is a physician on-call for urgent questions. For any emergencies you know to call 911 or go to the nearest emergency room   Topiramate tablets What is this medicine? TOPIRAMATE (toe PYRE a mate) is used to treat seizures in adults or children with epilepsy. It is also used for the prevention of migraine headaches. This medicine may be used for other purposes; ask your health care provider or pharmacist if you have questions. What should I tell my health care provider before I take this medicine? They need to know if you have any of these conditions: -bleeding disorders -cirrhosis of the liver or liver disease -diarrhea -glaucoma -kidney stones or kidney disease -low blood counts, like low white cell, platelet, or red cell counts -lung disease like asthma, obstructive pulmonary disease, emphysema -metabolic acidosis -on a ketogenic diet -schedule for surgery or a procedure -suicidal thoughts, plans, or attempt; a previous suicide attempt by you or a family member -an unusual or allergic reaction to topiramate, other medicines, foods, dyes, or preservatives -pregnant or trying to get pregnant -breast-feeding How should I use this medicine? Take this medicine by mouth with a glass of water. Follow the directions on the prescription label.  Do not crush or chew. You may take this medicine with meals. Take your medicine at regular intervals. Do not take it more often than directed. Talk to your pediatrician regarding the use of this medicine in children. Special care may be needed. While this drug may be prescribed for children as young as 402 years of age for selected conditions, precautions do apply. Overdosage: If you think you have taken too much of this medicine contact a poison control center or emergency room at once. NOTE: This medicine is only for you. Do not share this medicine with others. What if I miss a dose? If you miss a dose, take it as soon as you can. If your next dose is to be taken in less than 6 hours, then do not take the missed dose. Take the next dose at your regular time. Do not take double or extra doses. What may interact with this medicine? Do not take this medicine with any of the following medications: -probenecid This medicine may also interact with the following medications: -acetazolamide -alcohol -amitriptyline -aspirin and aspirin-like medicines -birth control pills -certain medicines for depression -certain medicines for seizures -certain medicines that treat or prevent blood clots like warfarin, enoxaparin, dalteparin, apixaban, dabigatran, and rivaroxaban -digoxin -hydrochlorothiazide -lithium -medicines for pain, sleep, or muscle relaxation -metformin -methazolamide -NSAIDS, medicines for pain and inflammation, like ibuprofen or naproxen -pioglitazone -risperidone This list may not describe all possible interactions. Give your health care provider a list of all the medicines, herbs, non-prescription drugs, or dietary supplements you use. Also tell them if  you smoke, drink alcohol, or use illegal drugs. Some items may interact with your medicine. What should I watch for while using this medicine? Visit your doctor or health care professional for regular checks on your progress. Do not  stop taking this medicine suddenly. This increases the risk of seizures if you are using this medicine to control epilepsy. Wear a medical identification bracelet or chain to say you have epilepsy or seizures, and carry a card that lists all your medicines. This medicine can decrease sweating and increase your body temperature. Watch for signs of deceased sweating or fever, especially in children. Avoid extreme heat, hot baths, and saunas. Be careful about exercising, especially in hot weather. Contact your health care provider right away if you notice a fever or decrease in sweating. You should drink plenty of fluids while taking this medicine. If you have had kidney stones in the past, this will help to reduce your chances of forming kidney stones. If you have stomach pain, with nausea or vomiting and yellowing of your eyes or skin, call your doctor immediately. You may get drowsy, dizzy, or have blurred vision. Do not drive, use machinery, or do anything that needs mental alertness until you know how this medicine affects you. To reduce dizziness, do not sit or stand up quickly, especially if you are an older patient. Alcohol can increase drowsiness and dizziness. Avoid alcoholic drinks. If you notice blurred vision, eye pain, or other eye problems, seek medical attention at once for an eye exam. The use of this medicine may increase the chance of suicidal thoughts or actions. Pay special attention to how you are responding while on this medicine. Any worsening of mood, or thoughts of suicide or dying should be reported to your health care professional right away. This medicine may increase the chance of developing metabolic acidosis. If left untreated, this can cause kidney stones, bone disease, or slowed growth in children. Symptoms include breathing fast, fatigue, loss of appetite, irregular heartbeat, or loss of consciousness. Call your doctor immediately if you experience any of these side effects.  Also, tell your doctor about any surgery you plan on having while taking this medicine since this may increase your risk for metabolic acidosis. Birth control pills may not work properly while you are taking this medicine. Talk to your doctor about using an extra method of birth control. Women who become pregnant while using this medicine may enroll in the [place] American Antiepileptic Drug Pregnancy Registry by calling [phone]. This registry collects information about the safety of antiepileptic drug use during pregnancy. What side effects may I notice from receiving this medicine? Side effects that you should report to your doctor or health care professional as soon as possible: -allergic reactions like skin rash, itching or hives, swelling of the face, lips, or tongue -decreased sweating and/or rise in body temperature -depression -difficulty breathing, fast or irregular breathing patterns -difficulty speaking -difficulty walking or controlling muscle movements -hearing impairment -redness, blistering, peeling or loosening of the skin, including inside the mouth -tingling, pain or numbness in the hands or feet -unusual bleeding or bruising -unusually weak or tired -worsening of mood, thoughts or actions of suicide or dying Side effects that usually do not require medical attention (report to your doctor or health care professional if they continue or are bothersome): -altered taste -back pain, joint or muscle aches and pains -diarrhea, or constipation -headache -loss of appetite -nausea -stomach upset, indigestion -tremors This list may not describe all possible side  effects. Call your doctor for medical advice about side effects. You may report side effects to FDA at 1-800-FDA-1088. Where should I keep my medicine? Keep out of the reach of children. Store at room temperature between 15 and 30 degrees C (59 and 86 degrees F) in a tightly closed container. Protect from moisture.  Throw away any unused medicine after the expiration date. NOTE: This sheet is a summary. It may not cover all possible information. If you have questions about this medicine, talk to your doctor, pharmacist, or health care provider.    2016, Elsevier/Gold Standard. (2013-07-07 23:17:57)

## 2016-01-06 NOTE — Progress Notes (Signed)
Gave Toradol 60mg/2ml IM in right deltoid. Cleaned with alcohol wipe prior to injection. Band-aid applied. Pt tolerated well.   

## 2016-01-06 NOTE — Progress Notes (Signed)
[facility]    Provider:  Dr Lucia Gaskins Referring Provider: Evelene Croon, MD Primary Care Physician:  Evelene Croon, MD  CC:  Convulsion/episode of altered awareness.  HPI:  Mckenzie Anderson is a 45 y.o. female here as a referral from Dr. Lacie Scotts for a seizure. PMHx HTN, migraines, muscle cramps. She has been working third shift and she woke up out of her sleep with a charlie horse cramp, severe, painful, it was a "25/10", her daughter grabbed it and it was worse. She has a history of muscle cramps of unknown etiology and no inciting events often wake her up in the middle of the night. She started sweating, started feeling like she was going to pass out, she had ringing in her ears and she thought she was going to die. She passed out, by report she was jerking limbs per daughter for 1-2 minutes, no tongue biting, no incontinence, when she came to she asked for some water, no confusion. No personal history of seizures, her great aunt used to take phenobarb for seizures but no other family members. She has pulsative tinnitus and can hear it in her ears, she has blurry vision, almost daily headaches, can hear heartbeat in the ears. She was told to take Topamax just as needed for her migraines. She has daily migraines. Significant hx of stroke in the family, daughter with stroke at the age of 53. No other focal neurologic deficits or complaints.  Reviewed notes, labs and imaging from outside physicians, which showed:   CBC with elevated WBCs 12.1, BMP with sodium 133,   Ct showed No acute intracranial abnormalities including mass lesion or mass effect, hydrocephalus, extra-axial fluid collection, midline shift, hemorrhage, or acute infarction, large ischemic events (personally reviewed images)  Review of Systems: Patient complains of symptoms per HPI as well as the following symptoms: No chest pain, no shortness of breath, no fever, no chills. Pertinent negatives per HPI.  All others negative.   Social History   Social History  . Marital Status: Divorced    Spouse Name: N/A  . Number of Children: 3  . Years of Education: AA   Occupational History  . [employer]    Social History Main Topics  . Smoking status: Current Every Day Smoker    Types: Cigarettes  . Smokeless tobacco: Not on file  . Alcohol Use: No  . Drug Use: No  . Sexual Activity: Not on file   Other Topics Concern  . Not on file   Social History Narrative   Lives with mother and 2 children    Caffeine use: 2-3 Mt Dew[employer] occasionally        Family History  Problem Relation Age of Onset  . Cancer    . Seizures      Grandmother's sister  . Seizures Maternal Aunt   . Migraines Neg Hx     Past Medical History  Diagnosis Date  . Hypertension   . Migraines     Past Surgical History  Procedure Laterality Date  . Abdominal hysterectomy    . Cholecystectomy    . Carpal tunnel release      Current Outpatient Prescriptions  Medication Sig Dispense Refill  . ALBUTEROL IN     . Beclomethasone Dipropionate (QVAR IN)     . chlorpheniramine-HYDROcodone (TUSSIONEX) 10-8 MG/5ML SUER Take 5 mLs by mouth every 12 (twelve) hours as needed.  0  . losartan-hydrochlorothiazide (HYZAAR) 100-12.5 MG tablet Take 1 tablet by mouth daily.  3  . naproxen (NAPROSYN) 500 MG tablet Take 1 tablet (500 mg total) by mouth 2 (two) times daily with a meal. 20 tablet 0  . naproxen sodium (ALEVE) 220 MG tablet Take 220-880 mg by mouth every 12 (twelve) hours as needed (for headaches).    . orphenadrine (NORFLEX) 100 MG tablet Take 1 tablet (100 mg total) by mouth 2 (two) times daily. (Patient not taking: Reported on 12/28/2015) 30 tablet 0  . phentermine 37.5 MG capsule Take 37.5 mg by mouth See admin instructions. One-half to one tablet daily as needed    . topiramate (TOPAMAX) 100 MG tablet Take 1 tablet (100 mg total) by mouth at bedtime. 30 tablet 11   No current facility-administered  medications for this visit.    Allergies as of 01/06/2016 - Review Complete 12/28/2015  Allergen Reaction Noted  . Sulfa antibiotics  02/03/2015    Vitals: BP 143/99 mmHg  Pulse 83  Ht 5\' 8"  (1.727 m)  Wt 242 lb 12.8 oz (110.133 kg)  BMI 36.93 kg/m2 Last Weight:  Wt Readings from Last 1 Encounters:  01/06/16 242 lb 12.8 oz (110.133 kg)   Last Height:   Ht Readings from Last 1 Encounters:  01/06/16 5\' 8"  (1.727 m)    Physical exam: Exam: Gen: NAD, conversant, well nourised, obese, well groomed                     CV: RRR, no MRG. No Carotid Bruits. No peripheral edema, warm, nontender Eyes: Conjunctivae clear without exudates or hemorrhage  Neuro: Detailed Neurologic Exam  Speech:    Speech is normal; fluent and spontaneous with normal comprehension.  Cognition:    The patient is oriented to person, place, and time;     recent and remote memory intact;     language fluent;     normal attention, concentration,     fund of knowledge Cranial Nerves:    The pupils are equal, round, and reactive to light. The fundi are normal and spontaneous venous pulsations are present. Visual fields are full to finger confrontation. Extraocular movements are intact. Trigeminal sensation is intact and the muscles of mastication are normal. The face is symmetric. The palate elevates in the midline. Hearing intact. Voice is normal. Shoulder shrug is normal. The tongue has normal motion without fasciculations.   Coordination:    Normal finger to nose and heel to shin. Normal rapid alternating movements.   Gait:    Heel-toe and tandem gait are normal.   Motor Observation:    No asymmetry, no atrophy, and no involuntary movements noted. Tone:    Normal muscle tone.    Posture:    Posture is normal. normal erect    Strength:    Strength is V/V in the upper and lower limbs.      Sensation: intact to LT     Reflex Exam:  DTR's:    Deep tendon reflexes in the upper and lower  extremities are normal bilaterally.   Toes:    The toes are downgoing bilaterally.   Clonus:    Clonus is absent.       Assessment/Plan:  45 year old patient with convulsive syncope in the setting of severe pain, no confusion afterwards. 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  As far as your medications are concerned, I would like to suggest: Patient is on Topamax for migraines however she is only taking it as needed for migraines. Discussed that this medication  needs to be taken every day. Topiramate: Take 1/2 tablet at bedtime for 1-2 weeks then increase to a while tablet every night. Discussed side effects as listed in patient instructions.  As far as diagnostic testing: MRI of the brain and blood vessels, EEG for convulsion  Patient is unable to drive, operate heavy machinery, perform activities at heights or participate in water activities until 6 months event free  To prevent or relieve headaches, try the following: Cool Compress. Lie down and place a cool compress on your head.  Avoid headache triggers. If certain foods or odors seem to have triggered your migraines in the past, avoid them. A headache diary might help you identify triggers.  Include physical activity in your daily routine. Try a daily walk or other moderate aerobic exercise.  Manage stress. Find healthy ways to cope with the stressors, such as delegating tasks on your to-do list.  Practice relaxation techniques. Try deep breathing, yoga, massage and visualization.  Eat regularly. Eating regularly scheduled meals and maintaining a healthy diet might help prevent headaches. Also, drink plenty of fluids.  Follow a regular sleep schedule. Sleep deprivation might contribute to headaches Consider biofeedback. With this mind-body technique, you learn to control certain bodily functions - such as muscle tension, heart rate and blood pressure - to prevent headaches or reduce headache  pain.    Proceed to emergency room if you experience new or worsening symptoms or symptoms do not resolve, if you have new neurologic symptoms or if headache is severe, or for any concerning symptom.   Cc: Dr. Carollee MassedNiemeyer  Antonia Ahern, MD  [facility] [address] [place], [place] [id number]  Phone [phone] Fax [phone]

## 2016-01-08 ENCOUNTER — Encounter: Payer: Self-pay | Admitting: Neurology

## 2016-01-08 DIAGNOSIS — R252 Cramp and spasm: Secondary | ICD-10-CM | POA: Insufficient documentation

## 2016-01-08 DIAGNOSIS — G43909 Migraine, unspecified, not intractable, without status migrainosus: Secondary | ICD-10-CM | POA: Insufficient documentation

## 2016-01-08 DIAGNOSIS — I10 Essential (primary) hypertension: Secondary | ICD-10-CM | POA: Insufficient documentation

## 2016-02-07 ENCOUNTER — Ambulatory Visit
Admission: RE | Admit: 2016-02-07 | Discharge: 2016-02-07 | Disposition: A | Discharge status: Home / Self Care | Payer: BLUE CROSS/BLUE SHIELD | Source: Ambulatory Visit | Attending: Neurology | Admitting: Neurology

## 2016-02-07 DIAGNOSIS — R51 Headache: Secondary | ICD-10-CM | POA: Diagnosis not present

## 2016-02-07 DIAGNOSIS — H539 Unspecified visual disturbance: Secondary | ICD-10-CM

## 2016-02-07 DIAGNOSIS — R519 Headache, unspecified: Secondary | ICD-10-CM

## 2016-02-07 DIAGNOSIS — H93A9 Pulsatile tinnitus, unspecified ear: Secondary | ICD-10-CM

## 2016-02-07 DIAGNOSIS — R569 Unspecified convulsions: Secondary | ICD-10-CM | POA: Diagnosis not present

## 2016-02-07 DIAGNOSIS — H938X3 Other specified disorders of ear, bilateral: Secondary | ICD-10-CM

## 2016-02-07 DIAGNOSIS — R404 Transient alteration of awareness: Secondary | ICD-10-CM

## 2016-02-07 MED ORDER — GADOBENATE DIMEGLUMINE 529 MG/ML IV SOLN
20.0000 mL | Freq: Once | INTRAVENOUS | Status: AC | PRN
  Administered 2016-02-07: 20 mL via INTRAVENOUS

## 2016-02-08 ENCOUNTER — Encounter: Payer: Self-pay | Admitting: Neurology

## 2016-02-08 ENCOUNTER — Ambulatory Visit (INDEPENDENT_AMBULATORY_CARE_PROVIDER_SITE_OTHER): Payer: BLUE CROSS/BLUE SHIELD | Admitting: Neurology

## 2016-02-08 DIAGNOSIS — R569 Unspecified convulsions: Secondary | ICD-10-CM

## 2016-02-08 DIAGNOSIS — R404 Transient alteration of awareness: Secondary | ICD-10-CM

## 2016-02-08 NOTE — Procedures (Signed)
    History:  Mckenzie Anderson is a 45 year old patient with a history of hypertension, and migraine headache. The patient had a recent event of a cramping sensation in her sleep, the patient started sweating, and had a sensation of near-syncope. The patient was noted to pass out, with jerking of the extremities lasting 1-2 minutes. The patient is being evaluated for seizures.  This is a routine EEG. No skull defects are noted. Medications include Tussionex, Hyzaar, Aleve, Norflex, phentermine, and Topamax.   EEG classification: Normal awake  Description of the recording: The background rhythms of this recording consists of a fairly well modulated medium amplitude alpha rhythm of 8 Hz that is reactive to eye opening and closure. As the record progresses, the patient appears to remain in the waking state throughout the recording. Photic stimulation was performed, resulting in a bilateral and symmetric photic driving response. Hyperventilation was also performed, resulting in a minimal buildup of the background rhythm activities without significant slowing seen. At no time during the recording does there appear to be evidence of spike or spike wave discharges or evidence of focal slowing. EKG monitor shows no evidence of cardiac rhythm abnormalities with a heart rate of 78.  Impression: This is a normal EEG recording in the waking state. No evidence of ictal or interictal discharges are seen.

## 2016-02-10 ENCOUNTER — Telehealth: Payer: Self-pay | Admitting: *Deleted

## 2016-02-10 NOTE — Telephone Encounter (Signed)
-----   Message from Antonia B Ahern, MD sent at 02/10/2016 10:08 AM EDT ----- EEG normal thanks 

## 2016-02-10 NOTE — Telephone Encounter (Signed)
LVM for pt to call about EEG results. Ok to inform pt EEG normal per Dr Lucia Gaskins.

## 2016-02-11 NOTE — Telephone Encounter (Signed)
Tried calling mobile number. Could not LVM because mailbox full. Will try home number again  Tried home number again. Spoke to pt about normal EEG per Dr Lucia Gaskins. Pt verbalized understanding.

## 2016-02-11 NOTE — Telephone Encounter (Signed)
-----   Message from Anson Fret, MD sent at 02/10/2016 10:08 AM EDT ----- EEG normal thanks

## 2016-04-11 ENCOUNTER — Encounter: Payer: Self-pay | Admitting: Neurology

## 2016-04-11 ENCOUNTER — Ambulatory Visit (INDEPENDENT_AMBULATORY_CARE_PROVIDER_SITE_OTHER): Payer: BLUE CROSS/BLUE SHIELD | Admitting: Neurology

## 2016-04-11 VITALS — BP 133/93 | HR 73 | Wt 254.4 lb

## 2016-04-11 DIAGNOSIS — G43709 Chronic migraine without aura, not intractable, without status migrainosus: Secondary | ICD-10-CM | POA: Diagnosis not present

## 2016-04-11 DIAGNOSIS — G4441 Drug-induced headache, not elsewhere classified, intractable: Secondary | ICD-10-CM

## 2016-04-11 DIAGNOSIS — R55 Syncope and collapse: Secondary | ICD-10-CM

## 2016-04-11 DIAGNOSIS — G4440 Drug-induced headache, not elsewhere classified, not intractable: Secondary | ICD-10-CM

## 2016-04-11 MED ORDER — PROCHLORPERAZINE MALEATE 10 MG PO TABS: ORAL_TABLET | ORAL | 1 refills | Status: DC

## 2016-04-11 MED ORDER — METHYLPREDNISOLONE 4 MG PO TBPK: ORAL_TABLET | ORAL | 1 refills | Status: DC

## 2016-04-11 MED ORDER — PROPRANOLOL HCL ER 60 MG PO CP24: 60.0000 mg | ORAL_CAPSULE | Freq: Every day | ORAL | 12 refills | Status: DC

## 2016-04-11 MED ORDER — KETOROLAC TROMETHAMINE 60 MG/2ML IM SOLN
60.0000 mg | Freq: Once | INTRAMUSCULAR | Status: AC
  Administered 2016-04-11: 60 mg via INTRAMUSCULAR

## 2016-04-11 NOTE — Progress Notes (Signed)
Gave Toradol 60mg/2ml IM in right deltoid. Cleaned with alcohol wipe prior to injection. Band-aid applied. Pt tolerated well.   

## 2016-04-11 NOTE — Progress Notes (Signed)
[name]    Provider:  Dr Lucia Anderson Referring Provider: Evelene CroonNiemeyer, Meindert, MD Primary Care Physician:  Mckenzie CroonNIEMEYER, MEINDERT, MD  CC:  Convulsion/episode of altered awareness.  Interval History: 45 year old patient here for follow up of transient alteration of awareness. 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 Seizure workup including MRI brain, MRA head and EEG were normal.  Likely convulsive syncope in the setting of severe pain, no confusion afterwards. No episodes since then. She is having daily headaches. It has been ongoing for years. She tried Topiramate. Headache is like someone is standing on her head. It is in the back. She has light sensitivity, sound sensitivity, nausea and vomiting. She has migraines 2x a week. They last up to 2 days. In 30 days she has 15 migraine days. She takes Excedrin migraine almost daily. She does not snore at night. Topiramate does not help. She has tried Alleve, Naproxen. Sumatriptan. Had side side effects to the Topamax. She wakes up wit headaches 1-2x a week.    HPI:  Mckenzie Anderson is a 45 y.o. female here as a referral from Mckenzie. Lacie Anderson for a seizure. PMHx HTN, migraines, muscle cramps. She has been working third shift and she woke up out of her sleep with a charlie horse cramp, severe, painful, it was a "25/10", her daughter grabbed it and it was worse. She has a history of muscle cramps of unknown etiology and no inciting events often wake her up in the middle of the night. She started sweating, started feeling like she was going to pass out, she had ringing in her ears and she thought she was going to die. She passed out, by report she was jerking limbs per daughter for 1-2 minutes, no tongue biting, no incontinence, when she came to she asked for some water, no confusion. No personal history of seizures, her great aunt used to take phenobarb for seizures but no other family members. She has pulsative tinnitus and can hear it in her ears,  she has blurry vision, almost daily headaches, can hear heartbeat in the ears. She was told to take Topamax just as needed for her migraines. She has daily migraines. Significant hx of stroke in the family, daughter with stroke at the age of 46. No other focal neurologic deficits or complaints.  Reviewed notes, labs and imaging from outside physicians, which showed:   CBC with elevated WBCs 12.1, BMP with sodium 133,   Ct showed No acute intracranial abnormalities including mass lesion or mass effect, hydrocephalus, extra-axial fluid collection, midline shift, hemorrhage, or acute infarction, large ischemic events (personally reviewed images)  Review of Systems: Patient complains of symptoms per HPI as well as the following symptoms: No chest pain, no shortness of breath, no fever, no chills. Pertinent negatives per HPI. All others negative.   Social History   Social History  . Marital status: Divorced    Spouse name: N/A  . Number of children: 3  . Years of education: AA   Occupational History  . [employer]    Social History Main Topics  . Smoking status: Current Every Day Smoker    Types: Cigarettes  . Smokeless tobacco: Not on file  . Alcohol use No  . Drug use: No  . Sexual activity: Not on file   Other Topics Concern  . Not on file   Social History Narrative   Lives with mother and 2 children    Caffeine use: 2-3 Mt Dews occasionally  Family History  Problem Relation Age of Onset  . Cancer    . Seizures      Grandmother's sister  . Seizures Maternal Aunt   . Migraines Neg Hx     Past Medical History:  Diagnosis Date  . Hypertension   . Migraines     Past Surgical History:  Procedure Laterality Date  . ABDOMINAL HYSTERECTOMY    . CARPAL TUNNEL RELEASE    . CHOLECYSTECTOMY      Current Outpatient Prescriptions  Medication Sig Dispense Refill  . ALBUTEROL IN     . Beclomethasone Dipropionate (QVAR IN)     .  chlorpheniramine-HYDROcodone (TUSSIONEX) 10-8 MG/5ML SUER Take 5 mLs by mouth every 12 (twelve) hours as needed.  0  . losartan-hydrochlorothiazide (HYZAAR) 100-12.5 MG tablet Take 1 tablet by mouth daily.  3  . naproxen sodium (ALEVE) 220 MG tablet Take 220-880 mg by mouth every 12 (twelve) hours as needed (for headaches).    . orphenadrine (NORFLEX) 100 MG tablet Take 1 tablet (100 mg total) by mouth 2 (two) times daily. 30 tablet 0  . phentermine 37.5 MG capsule Take 37.5 mg by mouth See admin instructions. One-half to one tablet daily as needed     No current facility-administered medications for this visit.     Allergies as of 04/11/2016 - Review Complete 04/11/2016  Allergen Reaction Noted  . Sulfa antibiotics  02/03/2015    Vitals: BP (!) 133/93 (BP Location: Right Arm, Patient Position: Sitting, Cuff Size: Large)   Pulse 73   Wt 254 lb 6.4 oz (115.4 kg)   BMI 38.68 kg/m  Last Weight:  Wt Readings from Last 1 Encounters:  04/11/16 254 lb 6.4 oz (115.4 kg)   Last Height:   Ht Readings from Last 1 Encounters:  01/06/16 5\' 8"  (1.727 m)    Physical exam: Exam: Gen: NAD, conversant, well nourised, obese, well groomed                     CV: RRR, no MRG. No Carotid Bruits. No peripheral edema, warm, nontender Eyes: Conjunctivae clear without exudates or hemorrhage  Neuro: Detailed Neurologic Exam  Speech:    Speech is normal; fluent and spontaneous with normal comprehension.  Cognition:    The patient is oriented to person, place, and time;     recent and remote memory intact;     language fluent;     normal attention, concentration,     fund of knowledge Cranial Nerves:    The pupils are equal, round, and reactive to light. The fundi are normal and spontaneous venous pulsations are present. Visual fields are full to finger confrontation. Extraocular movements are intact. Trigeminal sensation is intact and the muscles of mastication are normal. The face is symmetric.  The palate elevates in the midline. Hearing intact. Voice is normal. Shoulder shrug is normal. The tongue has normal motion without fasciculations.   Coordination:    Normal finger to nose and heel to shin. Normal rapid alternating movements.   Gait:    Heel-toe and tandem gait are normal.   Motor Observation:    No asymmetry, no atrophy, and no involuntary movements noted. Tone:    Normal muscle tone.    Posture:    Posture is normal. normal erect    Strength:    Strength is V/V in the upper and lower limbs.      Sensation: intact to LT     Reflex Exam:  DTR's:  Deep tendon reflexes in the upper and lower extremities are normal bilaterally.   Toes:    The toes are downgoing bilaterally.   Clonus:    Clonus is absent.       Assessment/Plan:  45 year old patient with convulsive syncope in the setting of severe pain, no confusion afterwards. 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 EEG and MRI negative. Also discussed Migraines.  Chronic daily headaches due to medication overuse  I had a long discussion with patient that her daily use of excedrin can cause medication overuse/rebound headache which is likely causing her chronic daily headaches. They only thing to do is to stop the medication unfortunately. In the timeframe after stopping at her headaches may get much worse. I will give her some medication to try to bridge that. She should significantly  improve with her chronic daily headaches after 2-4 weeks of being off her daily over-the-counter medications. Do not use these medications more than 2 times in a week.  Migraines: Discussed migraine management including acute management and preventative management. We'll start patient on propranolol. Discussed propranolol side effects especially teratogenicity do not get pregnant and use birth control and bradycardia, hypotention.   Remember to drink plenty of fluid, eat healthy meals and do not skip any meals. Try to  eat protein with a every meal and eat a healthy snack such as fruit or nuts in between meals. Try to keep a regular sleep-wake schedule and try to exercise daily, particularly in the form of walking, 20-30 minutes a day, if you can.   As far as your medications are concerned, I would like to suggest: - Stop daily over the counter med use (Tylenol, excedrin, ibuprofen, alleve) Do not tak emore than 2-3x in one week - For the next 6 days take steroids (medrol) and Compazine. Watch for sedation, do not drive unless you know how the compazine affects you.    Discussed; To prevent or relieve headaches, try the following:  Cool Compress. Lie down and place a cool compress on your head.   Avoid headache triggers. If certain foods or odors seem to have triggered your migraines in the past, avoid them. A headache diary might help you identify triggers.   Include physical activity in your daily routine. Try a daily walk or other moderate aerobic exercise.   Manage stress. Find healthy ways to cope with the stressors, such as delegating tasks on your to-do list.   Practice relaxation techniques. Try deep breathing, yoga, massage and visualization.   Eat regularly. Eating regularly scheduled meals and maintaining a healthy diet might help prevent headaches. Also, drink plenty of fluids.   Follow a regular sleep schedule. Sleep deprivation might contribute to headaches  Consider biofeedback. With this mind-body technique, you learn to control certain bodily functions - such as muscle tension, heart rate and blood pressure - to prevent headaches or reduce headache pain.    Proceed to emergency room if you experience new or worsening symptoms or symptoms do not resolve, if you have new neurologic symptoms or if headache is severe, or for any concerning symptom.    Naomie Dean, MD  [facility] [address] [place], [place] [address]  Phone [phone] Fax  [phone]  A total of 30 minutes was spent face-to-face with this patient. Over half this time was spent on counseling patient on the vasovagal syncope due to pain and migraine diagnosis and different diagnostic and therapeutic options available.

## 2016-04-11 NOTE — Patient Instructions (Signed)
Remember to drink plenty of fluid, eat healthy meals and do not skip any meals. Try to eat protein with a every meal and eat a healthy snack such as fruit or nuts in between meals. Try to keep a regular sleep-wake schedule and try to exercise daily, particularly in the form of walking, 20-30 minutes a day, if you can.   As far as your medications are concerned, I would like to suggest: Propranolol at night. Watch for low blood pressure and pulse and dizziness or any side effects. Compazine 10mg  up to 3x a day for headache. May Make you tired. Steroids for a week. Take daily pill sin the morning with food.  I would like to see you back in 3 months, sooner if we need to. Please call us with any interim questions, concerns, problems, updates or refill requests.   Our phone number is [phone]. We also have an after hours call service for urgent matters and there is a physician on-call for urgent questions. For any emergencies you know to call 911 or go to the nearest emergency room  Propranolol extended-release capsules What is this medicine? PROPRANOLOL (proe PRAN oh lole) is a beta-blocker. Beta-blockers reduce the workload on the heart and help it to beat more regularly. This medicine is used to treat high blood pressure, heart muscle disease, and prevent chest pain caused by angina. It is also used to prevent migraine headaches. You should not use this medicine to treat a migraine that has already started. This medicine may be used for other purposes; ask your health care provider or pharmacist if you have questions. What should I tell my health care provider before I take this medicine? They need to know if you have any of these conditions: -circulation problems, or blood vessel disease -diabetes -history of heart attack or heart disease, vasospastic angina -kidney disease -liver disease -lung or breathing disease, like asthma or emphysema -pheochromocytoma -slow heart rate -thyroid  disease -an unusual or allergic reaction to propranolol, other beta-blockers, medicines, foods, dyes, or preservatives -pregnant or trying to get pregnant -breast-feeding How should I use this medicine? Take this medicine by mouth with a glass of water. Follow the directions on the prescription label. Do not crush or chew. Take your doses at regular intervals. Do not take your medicine more often than directed. Do not stop taking except on the advice of your doctor or health care professional. Talk to your pediatrician regarding the use of this medicine in children. Special care may be needed. Overdosage: If you think you have taken too much of this medicine contact a poison control center or emergency room at once. NOTE: This medicine is only for you. Do not share this medicine with others. What if I miss a dose? If you miss a dose, take it as soon as you can. If it is almost time for your next dose, take only that dose. Do not take double or extra doses. What may interact with this medicine? Do not take this medicine with any of the following medications: -feverfew -phenothiazines like chlorpromazine, mesoridazine, prochlorperazine, thioridazine This medicine may also interact with the following medications: -aluminum hydroxide gel -antipyrine -antiviral medicines for HIV or AIDS -barbiturates like phenobarbital -certain medicines for blood pressure, heart disease, irregular heart beat -cimetidine -ciprofloxacin -diazepam -fluconazole -haloperidol -isoniazid -medicines for cholesterol like cholestyramine or colestipol -medicines for mental depression -medicines for migraine headache like almotriptan, eletriptan, frovatriptan, naratriptan, rizatriptan, sumatriptan, zolmitriptan -NSAIDs, medicines for pain and inflammation, like ibuprofen or  naproxen -phenytoin -rifampin -teniposide -theophylline -thyroid medicines -tolbutamide -warfarin -zileuton This list may not describe all  possible interactions. Give your health care provider a list of all the medicines, herbs, non-prescription drugs, or dietary supplements you use. Also tell them if you smoke, drink alcohol, or use illegal drugs. Some items may interact with your medicine. What should I watch for while using this medicine? Visit your doctor or health care professional for regular check ups. Contact your doctor right away if your symptoms worsen. Check your blood pressure and pulse rate regularly. Ask your health care professional what your blood pressure and pulse rate should be, and when you should contact them. Do not stop taking this medicine suddenly. This could lead to serious heart-related effects. You may get drowsy or dizzy. Do not drive, use machinery, or do anything that needs mental alertness until you know how this drug affects you. Do not stand or sit up quickly, especially if you are an older patient. This reduces the risk of dizzy or fainting spells. Alcohol can make you more drowsy and dizzy. Avoid alcoholic drinks. This medicine can affect blood sugar levels. If you have diabetes, check with your doctor or health care professional before you change your diet or the dose of your diabetic medicine. Do not treat yourself for coughs, colds, or pain while you are taking this medicine without asking your doctor or health care professional for advice. Some ingredients may increase your blood pressure. What side effects may I notice from receiving this medicine? Side effects that you should report to your doctor or health care professional as soon as possible: -allergic reactions like skin rash, itching or hives, swelling of the face, lips, or tongue -breathing problems -changes in blood sugar -cold hands or feet -difficulty sleeping, nightmares -dry peeling skin -hallucinations -muscle cramps or weakness -slow heart rate -swelling of the legs and ankles -vomiting Side effects that usually do not require  medical attention (report to your doctor or health care professional if they continue or are bothersome): -change in sex drive or performance -diarrhea -dry sore eyes -hair loss -nausea -weak or tired This list may not describe all possible side effects. Call your doctor for medical advice about side effects. You may report side effects to FDA at 1-800-FDA-1088. Where should I keep my medicine? Keep out of the reach of children. Store at room temperature between 15 and 30 degrees C (59 and 86 degrees F). Protect from light, moisture and freezing. Keep container tightly closed. Throw away any unused medicine after the expiration date. NOTE: This sheet is a summary. It may not cover all possible information. If you have questions about this medicine, talk to your doctor, pharmacist, or health care provider.    2016, [employer]. (2013-03-07 14:58:56)   Methylprednisolone tablets What is this medicine? METHYLPREDNISOLONE (meth ill pred NISS oh lone) is a corticosteroid. It is commonly used to treat inflammation of the skin, joints, lungs, and other organs. Common conditions treated include asthma, allergies, and arthritis. It is also used for other conditions, such as blood disorders and diseases of the adrenal glands. This medicine may be used for other purposes; ask your health care provider or pharmacist if you have questions. What should I tell my health care provider before I take this medicine? They need to know if you have any of these conditions: -Cushing's syndrome -diabetes -glaucoma -heart problems or disease -high blood pressure -infection such as herpes, measles, tuberculosis, or chickenpox -kidney disease -liver disease -mental  problems -myasthenia gravis -osteoporosis -seizures -stomach ulcer or intestine disease including colitis and diverticulitis -thyroid problem -an unusual or allergic reaction to lactose, methylprednisolone, other medicines, foods,  dyes, or preservatives -pregnant or trying to get pregnant -breast-feeding How should I use this medicine? Take this medicine by mouth with a drink of water. Follow the directions on the prescription label. Take it with food or milk to avoid stomach upset. If you are taking this medicine once a day, take it in the morning. Do not take more medicine than you are told to take. Do not suddenly stop taking your medicine because you may develop a severe reaction. Your doctor will tell you how much medicine to take. If your doctor wants you to stop the medicine, the dose may be slowly lowered over time to avoid any side effects. Talk to your pediatrician regarding the use of this medicine in children. Special care may be needed. Overdosage: If you think you have taken too much of this medicine contact a poison control center or emergency room at once. NOTE: This medicine is only for you. Do not share this medicine with others. What if I miss a dose? If you miss a dose, take it as soon as you can. If it is almost time for your next dose, talk to your doctor or health care professional. You may need to miss a dose or take an extra dose. Do not take double or extra doses without advice. What may interact with this medicine? Do not take this medicine with any of the following medications: -mifepristone This medicine may also interact with the following medications: -tacrolimus -vaccines -warfarin This list may not describe all possible interactions. Give your health care provider a list of all the medicines, herbs, non-prescription drugs, or dietary supplements you use. Also tell them if you smoke, drink alcohol, or use illegal drugs. Some items may interact with your medicine. What should I watch for while using this medicine? Visit your doctor or health care professional for regular checks on your progress. If you are taking this medicine for a long time, carry an identification card with your name and  address, the type and dose of your medicine, and your doctor's name and address. The medicine may increase your risk of getting an infection. Stay away from people who are sick. Tell your doctor or health care professional if you are around anyone with measles or chickenpox. If you are going to have surgery, tell your doctor or health care professional that you have taken this medicine within the last twelve months. Ask your doctor or health care professional about your diet. You may need to lower the amount of salt you eat. The medicine can increase your blood sugar. If you are a diabetic check with your doctor if you need help adjusting the dose of your diabetic medicine. What side effects may I notice from receiving this medicine? Side effects that you should report to your doctor or health care professional as soon as possible: -allergic reactions like skin rash, itching or hives, swelling of the face, lips, or tongue -eye pain, decreased or blurred vision, or bulging eyes -fever, sore throat, sneezing, cough, or other signs of infection, wounds that will not heal -increased thirst -mental depression, mood swings, mistaken feelings of self importance or of being mistreated -pain in hips, back, ribs, arms, shoulders, or legs -swelling of the ankles, feet, hands -trouble passing urine or change in the amount of urine Side effects that usually do not  require medical attention (report to your doctor or health care professional if they continue or are bothersome): -confusion, excitement, restlessness -headache -nausea, vomiting -skin problems, acne, thin and shiny skin -weight gain This list may not describe all possible side effects. Call your doctor for medical advice about side effects. You may report side effects to FDA at 1-800-FDA-1088. Where should I keep my medicine? Keep out of the reach of children. Store at room temperature between 20 and 25 degrees C (68 and 77 degrees F). Throw  away any unused medicine after the expiration date. NOTE: This sheet is a summary. It may not cover all possible information. If you have questions about this medicine, talk to your doctor, pharmacist, or health care provider.    2016, [employer]. (2012-04-02 11:38:34)   Prochlorperazine tablets What is this medicine? PROCHLORPERAZINE (proe klor PER a zeen) helps to control severe nausea and vomiting. This medicine is also used to treat schizophrenia. It can also help patients who experience anxiety that is not due to psychological illness. This medicine may be used for other purposes; ask your health care provider or pharmacist if you have questions. What should I tell my health care provider before I take this medicine? They need to know if you have any of these conditions: -blood disorders or disease -dementia -liver disease or jaundice -Parkinson's disease -uncontrollable movement disorder -an unusual or allergic reaction to prochlorperazine, other medicines, foods, dyes, or preservatives -pregnant or trying to get pregnant -breast-feeding How should I use this medicine? Take this medicine by mouth with a glass of water. Follow the directions on the prescription label. Take your doses at regular intervals. Do not take your medicine more often than directed. Do not stop taking this medicine suddenly. This can cause nausea, vomiting, and dizziness. Ask your doctor or health care professional for advice. Talk to your pediatrician regarding the use of this medicine in children. Special care may be needed. While this drug may be prescribed for children as young as 2 years for selected conditions, precautions do apply. Overdosage: If you think you have taken too much of this medicine contact a poison control center or emergency room at once. NOTE: This medicine is only for you. Do not share this medicine with others. What if I miss a dose? If you miss a dose, take it as soon as  you can. If it is almost time for your next dose, take only that dose. Do not take double or extra doses. What may interact with this medicine? Do not take this medicine with any of the following medications: -amoxapine -antidepressants like citalopram, escitalopram, fluoxetine, paroxetine, and sertraline -deferoxamine -dofetilide -maprotiline -tricyclic antidepressants like amitriptyline, clomipramine, imipramine, nortiptyline and others This medicine may also interact with the following medications: -lithium -medicines for pain -phenytoin -propranolol -warfarin This list may not describe all possible interactions. Give your health care provider a list of all the medicines, herbs, non-prescription drugs, or dietary supplements you use. Also tell them if you smoke, drink alcohol, or use illegal drugs. Some items may interact with your medicine. What should I watch for while using this medicine? Visit your doctor or health care professional for regular checks on your progress. You may get drowsy or dizzy. Do not drive, use machinery, or do anything that needs mental alertness until you know how this medicine affects you. Do not stand or sit up quickly, especially if you are an older patient. This reduces the risk of dizzy or fainting spells. Alcohol may  interfere with the effect of this medicine. Avoid alcoholic drinks. This medicine can reduce the response of your body to heat or cold. Dress warm in cold weather and stay hydrated in hot weather. If possible, avoid extreme temperatures like saunas, hot tubs, very hot or cold showers, or activities that can cause dehydration such as vigorous exercise. This medicine can make you more sensitive to the sun. Keep out of the sun. If you cannot avoid being in the sun, wear protective clothing and use sunscreen. Do not use sun lamps or tanning beds/booths. Your mouth may get dry. Chewing sugarless gum or sucking hard candy, and drinking plenty of water  may help. Contact your doctor if the problem does not go away or is severe. What side effects may I notice from receiving this medicine? Side effects that you should report to your doctor or health care professional as soon as possible: -blurred vision -breast enlargement in men or women -breast milk in women who are not breast-feeding -chest pain, fast or irregular heartbeat -confusion, restlessness -dark yellow or brown urine -difficulty breathing or swallowing -dizziness or fainting spells -drooling, shaking, movement difficulty (shuffling walk) or rigidity -fever, chills, sore throat -involuntary or uncontrollable movements of the eyes, mouth, head, arms, and legs -seizures -stomach area pain -unusually weak or tired -unusual bleeding or bruising -yellowing of skin or eyes Side effects that usually do not require medical attention (report to your doctor or health care professional if they continue or are bothersome): -difficulty passing urine -difficulty sleeping -headache -sexual dysfunction -skin rash, or itching This list may not describe all possible side effects. Call your doctor for medical advice about side effects. You may report side effects to FDA at 1-800-FDA-1088. Where should I keep my medicine? Keep out of the reach of children. Store at room temperature between 15 and 30 degrees C (59 and 86 degrees F). Protect from light. Throw away any unused medicine after the expiration date. NOTE: This sheet is a summary. It may not cover all possible information. If you have questions about this medicine, talk to your doctor, pharmacist, or health care provider.    2016, [employer]. (2011-11-21 16:59:39)

## 2016-04-12 ENCOUNTER — Telehealth: Payer: Self-pay | Admitting: *Deleted

## 2016-04-12 ENCOUNTER — Telehealth: Payer: Self-pay | Admitting: Neurology

## 2016-04-12 NOTE — Telephone Encounter (Signed)
Dr Lucia GaskinsAhernLorain Childes- FYI  Patient called office. She stated she thinks she is having reaction to toradol shot she received yesterday. She stated she went to sleep by 8am last night. She has been very tired. She has a sore throat, is running a low-grade fever. She does not feel well. I advised that toradol was a stronger form of ibuprofen. She states her arm is still sore. Advised injection site pain can be a SE because the injection went into her muscle.  I placed patient on hold and spoke with Dr Lucia GaskinsAhern. Per Dr Lucia Gaskinsahern, she does not think toradol shot is causing her sx. Toradol would not last this long and it is a stronger form of ibuprofen. She should proceed to urgent care or f/u with PCP about sx. I relayed this to the patient and she verbalized understanding.

## 2016-04-12 NOTE — Telephone Encounter (Signed)
error 

## 2016-05-22 ENCOUNTER — Encounter: Payer: Self-pay | Admitting: Neurology

## 2016-05-30 ENCOUNTER — Other Ambulatory Visit: Payer: Self-pay | Admitting: Obstetrics and Gynecology

## 2016-05-30 DIAGNOSIS — N63 Unspecified lump in unspecified breast: Secondary | ICD-10-CM

## 2016-06-16 ENCOUNTER — Other Ambulatory Visit: Payer: BLUE CROSS/BLUE SHIELD

## 2016-06-16 ENCOUNTER — Ambulatory Visit: Payer: BLUE CROSS/BLUE SHIELD

## 2016-07-12 ENCOUNTER — Ambulatory Visit
Admission: RE | Admit: 2016-07-12 | Discharge: 2016-07-12 | Disposition: A | Discharge status: Home / Self Care | Payer: BLUE CROSS/BLUE SHIELD | Source: Ambulatory Visit | Attending: Obstetrics and Gynecology | Admitting: Obstetrics and Gynecology

## 2016-07-12 DIAGNOSIS — N6310 Unspecified lump in the right breast, unspecified quadrant: Secondary | ICD-10-CM | POA: Diagnosis not present

## 2016-07-12 DIAGNOSIS — N63 Unspecified lump in unspecified breast: Secondary | ICD-10-CM

## 2016-07-19 ENCOUNTER — Ambulatory Visit: Payer: BLUE CROSS/BLUE SHIELD | Admitting: Neurology

## 2017-04-19 NOTE — Progress Notes (Signed)
04/23/2017 10:16 AM   Mckenzie Anderson 1971-04-01 [id number]  Referring provider: Evelene Croon, MD [address] [place], [place] [address]  Chief Complaint  Patient presents with  . New Patient (Initial Visit)    Kidney stone referred by Franco Nones FNP    HPI: Patient is a 46 -year-old African-American female who presents today as a referral from their PCP, Franco Nones, FNP, for positive urine dips for blood.    She does not have a prior history of recurrent urinary tract infections, nephrolithiasis, trauma to the genitourinary tract or malignancies of the genitourinary tract.   She does not have a family medical history of nephrolithiasis, malignancies of the genitourinary tract or hematuria.   Today, she is having/not having symptoms of incontinence, feelings of incomplete bladder emptying and or a weak urinary stream.  Her UA today is negative for microscopic hematuria.  Her PVR is 0 mL.   She is having bilateral flank pain that radiates to the waist bilaterally.  She states the pain is 7-8/10.  Nothing makes the pain better.  NSAIDS help the pain.  She is not experiencing any suprapubic pain.  She denies any recent fevers, chills, nausea or vomiting.   She has not had any recent imaging studies. RUS was performed in PCP's office and the patient states it showed kidney stones.  I do not have the report or images available to me.    She is a smoker.  She has not worked with Personnel officer, trichloroethylene, etc.   She has a high BMI.     PMH: Past Medical History:  Diagnosis Date  . Asthma   . Gross hematuria   . Heartburn   . Hypertension   . Migraines     Surgical History: Past Surgical History:  Procedure Laterality Date  . ABDOMINAL HYSTERECTOMY    . CARPAL TUNNEL RELEASE    . CHOLECYSTECTOMY      Home Medications:  Allergies as of 04/23/2017      Reactions   Sulfa Antibiotics       Medication List       Accurate as of 04/23/17 10:16 AM.  Always use your most recent med list.          ALBUTEROL IN   ALEVE 220 MG tablet Generic drug:  naproxen sodium Take 220-880 mg by mouth every 12 (twelve) hours as needed (for headaches).   amLODipine 10 MG tablet Commonly known as:  NORVASC Take by mouth.   aspirin-acetaminophen-caffeine 250-250-65 MG tablet Commonly known as:  EXCEDRIN MIGRAINE Take 1 tablet by mouth every 6 (six) hours as needed for headache.   chlorpheniramine-HYDROcodone 10-8 MG/5ML Suer Commonly known as:  TUSSIONEX Take 5 mLs by mouth every 12 (twelve) hours as needed.   losartan-hydrochlorothiazide 100-12.5 MG tablet Commonly known as:  HYZAAR Take 1 tablet by mouth daily.   methylPREDNISolone 4 MG Tbpk tablet Commonly known as:  MEDROL DOSEPAK follow package directions   orphenadrine 100 MG tablet Commonly known as:  NORFLEX Take 1 tablet (100 mg total) by mouth 2 (two) times daily.   phentermine 37.5 MG capsule Take 37.5 mg by mouth See admin instructions. One-half to one tablet daily as needed   prochlorperazine 10 MG tablet Commonly known as:  COMPAZINE Take 1 tablet (10 mg total) by mouth every 8 (eight) hours as needed for headache   propranolol ER 60 MG 24 hr capsule Commonly known as:  INDERAL LA Take 1 capsule (60 mg total) by  mouth daily.   QVAR IN   Vitamin D (Ergocalciferol) 50000 units Caps capsule Commonly known as:  DRISDOL Take by mouth.       Allergies:  Allergies  Allergen Reactions  . Sulfa Antibiotics     Family History: Family History  Problem Relation Age of Onset  . Kidney cancer Father   . Cancer Unknown   . Seizures Unknown        Grandmother's sister  . Seizures Maternal Aunt   . Breast cancer Maternal Grandmother 16  . Migraines Neg Hx   . Bladder Cancer Neg Hx     Social History:  reports that she has been smoking Cigarettes.  She has never used smokeless tobacco. She reports that she does not drink alcohol or use  drugs.  ROS: UROLOGY Frequent Urination?: No Hard to postpone urination?: No Burning/pain with urination?: No Get up at night to urinate?: No Leakage of urine?: Yes Urine stream starts and stops?: No Trouble starting stream?: No Do you have to strain to urinate?: Yes Blood in urine?: Yes Urinary tract infection?: No Sexually transmitted disease?: No Injury to kidneys or bladder?: No Painful intercourse?: No Weak stream?: Yes Currently pregnant?: No Vaginal bleeding?: No Last menstrual period?: n  Gastrointestinal Nausea?: No Vomiting?: No Indigestion/heartburn?: No Diarrhea?: Yes Constipation?: No  Constitutional Fever: No Night sweats?: Yes Weight loss?: No Fatigue?: No  Skin Skin rash/lesions?: No Itching?: No  Eyes Blurred vision?: No Double vision?: No  Ears/Nose/Throat Sore throat?: No Sinus problems?: No  Hematologic/Lymphatic Swollen glands?: No Easy bruising?: No  Cardiovascular Leg swelling?: No Chest pain?: No  Respiratory Cough?: Yes Shortness of breath?: No  Endocrine Excessive thirst?: No  Musculoskeletal Back pain?: Yes Joint pain?: No  Neurological Headaches?: Yes Dizziness?: Yes  Psychologic Depression?: No Anxiety?: No  Physical Exam: BP 129/87   Pulse 93   Ht  (1.727 m)   Wt 246 lb 11.2 oz (111.9 kg)   BMI 37.51 kg/m   Constitutional: Well nourished. Alert and oriented, No acute distress. HEENT: [place] AT, moist mucus membranes. Trachea midline, no masses. Cardiovascular: No clubbing, cyanosis, or edema. Respiratory: Normal respiratory effort, no increased work of breathing. GI: Abdomen is soft, non tender, non distended, no abdominal masses. Liver and spleen not palpable.  No hernias appreciated.  Stool sample for occult testing is not indicated.   GU: No CVA tenderness.  No bladder fullness or masses.   Skin: No rashes, bruises or suspicious lesions. Lymph: No cervical or inguinal adenopathy. Neurologic:  Grossly intact, no focal deficits, moving all 4 extremities. Psychiatric: Normal mood and affect.  Laboratory Data: Urinalysis Negative.  See EPIC.  I have reviewed the labs.  Pertinent imaging Results for ESTEPHANI, POPPER (MRN [id number]) as of 04/23/2017 10:14  Ref. Range 04/23/2017 10:06  Scan Result Unknown 0    Assessment & Plan:    1. Pseudo hematuria  - I explained to the patient that numerous things can cause a positive urine dip and that it needs to be confirmed with microscopic analysis  - I explained that the AUA guidelines state that you need 3 or 4 more red blood cells seen microscopically to meet the criteria for acute microscopic hematuria  - Her UA today is negative for microscopic hematuria and therefore a hematuria workup is not warranted at this time  2. Flank pain  - will obtain a CT Renal Stone study to evaluate for stones  3. Urge incontinence  - will reassess once CT  scan is completed   4. Feelings of incomplete emptying  - PVR is 0 mL  - will reassess once CT scan is completed    Return for CT scan results.  These notes generated with voice recognition software. I apologize for typographical errors.  Michiel Cowboy, PA-C  [facility] [address] [place], [place] [address] [phone]

## 2017-04-23 ENCOUNTER — Encounter: Payer: Self-pay | Admitting: Urology

## 2017-04-23 ENCOUNTER — Ambulatory Visit (INDEPENDENT_AMBULATORY_CARE_PROVIDER_SITE_OTHER): Payer: BLUE CROSS/BLUE SHIELD | Admitting: Urology

## 2017-04-23 VITALS — BP 129/87 | HR 93 | Ht 68.0 in | Wt 246.7 lb

## 2017-04-23 DIAGNOSIS — R109 Unspecified abdominal pain: Secondary | ICD-10-CM | POA: Diagnosis not present

## 2017-04-23 DIAGNOSIS — R8290 Unspecified abnormal findings in urine: Secondary | ICD-10-CM | POA: Diagnosis not present

## 2017-04-23 DIAGNOSIS — R339 Retention of urine, unspecified: Secondary | ICD-10-CM

## 2017-04-23 DIAGNOSIS — R310 Gross hematuria: Secondary | ICD-10-CM

## 2017-04-23 LAB — URINALYSIS, COMPLETE
Bilirubin, UA: NEGATIVE
GLUCOSE, UA: NEGATIVE
Ketones, UA: NEGATIVE
Leukocytes, UA: NEGATIVE
Nitrite, UA: NEGATIVE
PH UA: 5.5 (ref 5.0–7.5)
RBC, UA: NEGATIVE
Specific Gravity, UA: >1.03 — ABNORMAL HIGH (ref 1.005–1.030)
Urobilinogen, Ur: 1 mg/dL (ref 0.2–1.0)

## 2017-04-23 LAB — MICROSCOPIC EXAMINATION
RBC, UA: NONE SEEN /hpf (ref 0–?)
WBC, UA: NONE SEEN /hpf (ref 0–?)

## 2017-04-23 LAB — BLADDER SCAN AMB NON-IMAGING: SCAN RESULT: 0

## 2017-04-24 LAB — BUN+CREAT
BUN / CREAT RATIO: 15 (ref 9–23)
BUN: 14 mg/dL (ref 6–24)
CREATININE: 0.94 mg/dL (ref 0.57–1.00)
GFR calc non Af Amer: 73 mL/min/{1.73_m2} (ref >59)
GFR, EST AFRICAN AMERICAN: 84 mL/min/{1.73_m2} (ref >59)

## 2017-04-26 LAB — CULTURE, URINE COMPREHENSIVE

## 2017-05-07 ENCOUNTER — Ambulatory Visit
Admission: RE | Admit: 2017-05-07 | Discharge: 2017-05-07 | Disposition: A | Discharge status: Home / Self Care | Payer: BLUE CROSS/BLUE SHIELD | Source: Ambulatory Visit | Attending: Urology | Admitting: Urology

## 2017-05-07 DIAGNOSIS — K449 Diaphragmatic hernia without obstruction or gangrene: Secondary | ICD-10-CM | POA: Insufficient documentation

## 2017-05-07 DIAGNOSIS — M1288 Other specific arthropathies, not elsewhere classified, other specified site: Secondary | ICD-10-CM | POA: Diagnosis not present

## 2017-05-07 DIAGNOSIS — M869 Osteomyelitis, unspecified: Secondary | ICD-10-CM | POA: Insufficient documentation

## 2017-05-07 DIAGNOSIS — R109 Unspecified abdominal pain: Secondary | ICD-10-CM | POA: Diagnosis not present

## 2017-05-10 NOTE — Progress Notes (Signed)
05/14/2017 10:08 AM   Mckenzie Anderson 07-Jul-1971 [id number]  Referring provider: Armando Gang, FNP [address] [place], [place] [address]  Chief Complaint  Patient presents with  . Results    CT    HPI: 46 yo AAF who presents today to discuss her CT report.  Background history Patient is a 67 -year-old African-American female who presents today as a referral from their PCP, Franco Nones, FNP, for positive urine dips for blood.  She does not have a prior history of recurrent urinary tract infections, nephrolithiasis, trauma to the genitourinary tract or malignancies of the genitourinary tract.   She does not have a family medical history of nephrolithiasis, malignancies of the genitourinary tract or hematuria.   Today, she is having/not having symptoms of incontinence, feelings of incomplete bladder emptying and or a weak urinary stream.  Her UA today is negative for microscopic hematuria.  Her PVR is 0 mL.   She is having bilateral flank pain that radiates to the waist bilaterally.  She states the pain is 7-8/10.  Nothing makes the pain better.  NSAIDS help the pain.  She is not experiencing any suprapubic pain.  She denies any recent fevers, chills, nausea or vomiting.   She has not had any recent imaging studies. RUS was performed in PCP's office and the patient states it showed kidney stones.  I do not have the report or images available to me.  She is a smoker.  She has not worked with Personnel officer, trichloroethylene, etc.   She has a high BMI.    CT Renal stone study performed on 05/07/2017 noted adrenal glands normal. No nephrolithiasis or hydronephrosis. Urinary bladder unremarkable.  Today, she is experiencing frequency, urgency, intermittency and a weak urinary stream. She is also experiencing constipation. She states that she has pain in her lower back and in her lower abdomen.  The patient is experiencing urgency x 4-7, frequency x 4-7, is restricting fluids  to avoid visits to the restroom, is engaging in toilet mapping, incontinence x 4-7 and nocturia x 0-3.     Her PVR is 16 mL.   This has been going for the last 2 to 3 months.  She is going through 3 panty liners a day.  Nothing is helping the urinary symptoms.  Not drinking helps the urinary issues.  She is not having dysuria, gross hematuria or suprapubic pain.  She is not having fevers, chills, nausea or vomiting.       PMH: Past Medical History:  Diagnosis Date  . Asthma   . Gross hematuria   . Heartburn   . Hypertension   . Migraines     This Surgical History: Past Surgical History:  Procedure Laterality Date  . ABDOMINAL HYSTERECTOMY    . CARPAL TUNNEL RELEASE    . CHOLECYSTECTOMY      Home Medications:  Allergies as of 05/14/2017      Reactions   Sulfa Antibiotics       Medication List       Accurate as of 05/14/17 10:08 AM. Always use your most recent med list.          ALBUTEROL IN   ALEVE 220 MG tablet Generic drug:  naproxen sodium Take 220-880 mg by mouth every 12 (twelve) hours as needed (for headaches).   amLODipine 10 MG tablet Commonly known as:  NORVASC Take by mouth.   aspirin-acetaminophen-caffeine 250-250-65 MG tablet Commonly known as:  EXCEDRIN MIGRAINE Take 1 tablet  by mouth every 6 (six) hours as needed for headache.   chlorpheniramine-HYDROcodone 10-8 MG/5ML Suer Commonly known as:  TUSSIONEX Take 5 mLs by mouth every 12 (twelve) hours as needed.   losartan-hydrochlorothiazide 100-12.5 MG tablet Commonly known as:  HYZAAR Take 1 tablet by mouth daily.   methylPREDNISolone 4 MG Tbpk tablet Commonly known as:  MEDROL DOSEPAK follow package directions   orphenadrine 100 MG tablet Commonly known as:  NORFLEX Take 1 tablet (100 mg total) by mouth 2 (two) times daily.   phentermine 37.5 MG capsule Take 37.5 mg by mouth See admin instructions. One-half to one tablet daily as needed   prochlorperazine 10 MG tablet Commonly known  as:  COMPAZINE Take 1 tablet (10 mg total) by mouth every 8 (eight) hours as needed for headache   propranolol ER 60 MG 24 hr capsule Commonly known as:  INDERAL LA Take 1 capsule (60 mg total) by mouth daily.   QVAR IN   Vitamin D (Ergocalciferol) 50000 units Caps capsule Commonly known as:  DRISDOL Take by mouth.       Allergies:  Allergies  Allergen Reactions  . Sulfa Antibiotics     Family History: Family History  Problem Relation Age of Onset  . Kidney cancer Father   . Cancer Unknown   . Seizures Unknown        Grandmother's sister  . Seizures Maternal Aunt   . Breast cancer Maternal Grandmother [id number]  . Migraines Neg Hx   . Bladder Cancer Neg Hx     Social History:  reports that she has been smoking Cigarettes.  She has never used smokeless tobacco. She reports that she does not drink alcohol or use drugs.  ROS: UROLOGY Frequent Urination?: Yes Hard to postpone urination?: Yes Burning/pain with urination?: No Get up at night to urinate?: No Leakage of urine?: No Urine stream starts and stops?: Yes Trouble starting stream?: No Do you have to strain to urinate?: No Blood in urine?: No Urinary tract infection?: No Sexually transmitted disease?: No Injury to kidneys or bladder?: No Painful intercourse?: No Weak stream?: Yes Currently pregnant?: No Vaginal bleeding?: No Last menstrual period?: n  Gastrointestinal Nausea?: No Vomiting?: No Indigestion/heartburn?: Yes Diarrhea?: No Constipation?: Yes  Constitutional Fever: No Night sweats?: No Weight loss?: No Fatigue?: No  Skin Skin rash/lesions?: No Itching?: No  Eyes Blurred vision?: No Double vision?: No  Ears/Nose/Throat Sore throat?: No Sinus problems?: No  Hematologic/Lymphatic Swollen glands?: No Easy bruising?: No  Cardiovascular Leg swelling?: No Chest pain?: No  Respiratory Cough?: No Shortness of breath?: No  Endocrine Excessive thirst?:  No  Musculoskeletal Back pain?: Yes Joint pain?: No  Neurological Headaches?: Yes Dizziness?: No  Psychologic Depression?: No Anxiety?: No  Physical Exam: BP (!) 146/94   Pulse 67   Ht 5\' 8"  (1.727 m)   Wt 250 lb 8 oz (113.6 kg)   BMI 38.09 kg/m   Constitutional: Well nourished. Alert and oriented, No acute distress. HEENT: [place] AT, moist mucus membranes. Trachea midline, no masses. Cardiovascular: No clubbing, cyanosis, or edema. Respiratory: Normal respiratory effort, no increased work of breathing. GI: Abdomen is soft, non tender, non distended, no abdominal masses. Liver and spleen not palpable.  No hernias appreciated.  Stool sample for occult testing is not indicated.   GU: No CVA tenderness.  No bladder fullness or masses.   Skin: No rashes, bruises or suspicious lesions. Lymph: No cervical or inguinal adenopathy. Neurologic: Grossly intact, no focal deficits, moving all 4  extremities. Psychiatric: Normal mood and affect.  Laboratory Data: No labs  Pertinent imaging CLINICAL DATA:  Hematuria.  Bilateral flank pain.  EXAM: CT ABDOMEN AND PELVIS WITHOUT CONTRAST  TECHNIQUE: Multidetector CT imaging of the abdomen and pelvis was performed following the standard protocol without IV contrast.  COMPARISON:  Overlapping portions of CT chest dated 02/01/2010  FINDINGS: Lower chest: Small type 1 hiatal hernia.  Hepatobiliary: Cholecystectomy.  Otherwise unremarkable.  Pancreas: Unremarkable  Spleen: Unremarkable  Adrenals/Urinary Tract: Adrenal glands normal. New no nephrolithiasis or hydronephrosis. Urinary bladder unremarkable.  Stomach/Bowel: Unremarkable  Vascular/Lymphatic: Unremarkable  Reproductive: Right adnexal structures difficult separate from adjacent loops of small bowel but thought to likely be within normal limits.  Other: Trace free pelvic fluid on image 67/2 eccentric to the right.  Musculoskeletal: Sclerosis along the  pubic bodies, left greater than right, favoring mild osteitis pubis. Transitional S1 vertebra. Degenerative facet arthropathy at L5-S1. Mild degenerative bilateral hip arthropathy.  IMPRESSION: 1. No cause for hematuria is identified on today's noncontrast exam. No stones identified. 2. Small type 1 hiatal hernia. 3. Mild osteitis pubis. 4. Degenerative facet arthropathy at L5-S1. 5. Trace free pelvic fluid, nonspecific.   Electronically Signed   By: Gaylyn Rong M.D.   On: 05/07/2017 14:32  I have independently reviewed the films   Assessment & Plan:    1. Flank pain  - no stones seen on CT - no urological cause for flank pain  - follow up with PCP for further evaluation - MSK issues seen on CT  2. Urge incontinence  - offered behavioral therapies, bladder training, bladder control strategies and pelvic floor muscle training - cannot take time off due to job  - fluid management - patient has been restricting fluids and effort to stave off urination  - offered medical therapy with anticholinergic therapy or beta-3 adrenergic receptor agonist and the potential side effects of each therapy - patient already with constipation, therefore she is not a candidate for anticholinergic therapy at this time  - would like to try the beta-3 adrenergic receptor agonist (Myrbetriq).  Given Myrbetriq 25 mg samples, #28.  I have reviewed with the patient of the side effects of Myrbetriq, such as: elevation in BP, urinary retention and/or HA.    - RTC in 3 weeks for PVR and symptom recheck   3. Feelings of incomplete emptying  - PVR is minimal -  symptoms likely due to overactive bladder   Return in about 3 weeks (around 06/04/2017) for PVR and OAB questionnaire.  These notes generated with voice recognition software. I apologize for typographical errors.  Michiel Cowboy, PA-C  [facility] [address], [address] [place], [place] [address] [phone]

## 2017-05-14 ENCOUNTER — Ambulatory Visit (INDEPENDENT_AMBULATORY_CARE_PROVIDER_SITE_OTHER): Payer: BLUE CROSS/BLUE SHIELD | Admitting: Urology

## 2017-05-14 ENCOUNTER — Encounter: Payer: Self-pay | Admitting: Urology

## 2017-05-14 VITALS — BP 146/94 | HR 67 | Ht 68.0 in | Wt 250.5 lb

## 2017-05-14 DIAGNOSIS — R339 Retention of urine, unspecified: Secondary | ICD-10-CM | POA: Diagnosis not present

## 2017-05-14 DIAGNOSIS — N3941 Urge incontinence: Secondary | ICD-10-CM

## 2017-05-14 DIAGNOSIS — R109 Unspecified abdominal pain: Secondary | ICD-10-CM | POA: Diagnosis not present

## 2017-05-14 LAB — BLADDER SCAN AMB NON-IMAGING: SCAN RESULT: 16

## 2017-06-11 ENCOUNTER — Ambulatory Visit: Payer: BLUE CROSS/BLUE SHIELD | Admitting: Urology

## 2017-10-04 ENCOUNTER — Ambulatory Visit
Admission: RE | Admit: 2017-10-04 | Discharge: 2017-10-04 | Disposition: A | Discharge status: Home / Self Care | Payer: BLUE CROSS/BLUE SHIELD | Source: Ambulatory Visit | Attending: Internal Medicine | Admitting: Internal Medicine

## 2017-10-04 ENCOUNTER — Other Ambulatory Visit: Payer: Self-pay | Admitting: Internal Medicine

## 2017-10-04 DIAGNOSIS — T80212A Local infection due to central venous catheter, initial encounter: Secondary | ICD-10-CM

## 2017-10-04 DIAGNOSIS — X58XXXA Exposure to other specified factors, initial encounter: Secondary | ICD-10-CM | POA: Insufficient documentation

## 2017-10-24 ENCOUNTER — Other Ambulatory Visit: Payer: Self-pay | Admitting: Sports Medicine

## 2017-10-24 DIAGNOSIS — M25521 Pain in right elbow: Secondary | ICD-10-CM

## 2017-10-24 DIAGNOSIS — M7521 Bicipital tendinitis, right shoulder: Secondary | ICD-10-CM

## 2017-10-31 ENCOUNTER — Ambulatory Visit
Admission: RE | Admit: 2017-10-31 | Discharge: 2017-10-31 | Disposition: A | Discharge status: Home / Self Care | Payer: BLUE CROSS/BLUE SHIELD | Source: Ambulatory Visit | Attending: Sports Medicine | Admitting: Sports Medicine

## 2017-10-31 DIAGNOSIS — S46211A Strain of muscle, fascia and tendon of other parts of biceps, right arm, initial encounter: Secondary | ICD-10-CM | POA: Diagnosis not present

## 2017-10-31 DIAGNOSIS — G8929 Other chronic pain: Secondary | ICD-10-CM | POA: Insufficient documentation

## 2017-10-31 DIAGNOSIS — M25521 Pain in right elbow: Secondary | ICD-10-CM | POA: Diagnosis not present

## 2017-10-31 DIAGNOSIS — M7521 Bicipital tendinitis, right shoulder: Secondary | ICD-10-CM

## 2017-10-31 DIAGNOSIS — M7711 Lateral epicondylitis, right elbow: Secondary | ICD-10-CM | POA: Insufficient documentation

## 2017-10-31 DIAGNOSIS — M71521 Other bursitis, not elsewhere classified, right elbow: Secondary | ICD-10-CM | POA: Insufficient documentation

## 2017-11-20 ENCOUNTER — Ambulatory Visit (HOSPITAL_COMMUNITY): Payer: BLUE CROSS/BLUE SHIELD | Attending: Sports Medicine | Admitting: Occupational Therapy

## 2017-11-20 ENCOUNTER — Encounter (HOSPITAL_COMMUNITY): Payer: Self-pay | Admitting: Occupational Therapy

## 2017-11-20 ENCOUNTER — Other Ambulatory Visit: Payer: Self-pay

## 2017-11-20 DIAGNOSIS — R29898 Other symptoms and signs involving the musculoskeletal system: Secondary | ICD-10-CM

## 2017-11-20 DIAGNOSIS — M25521 Pain in right elbow: Secondary | ICD-10-CM | POA: Insufficient documentation

## 2017-11-20 NOTE — Patient Instructions (Signed)
Complete exercises 10-15 times each, 2-3 times per day. 1) Elbow flexion and extension Bend your elbow upwards as shown and then lower to a straighten position.     2) Forearm supination and pronation Hold elbow at a right angle stabilizing on a table or armrest. Keep elbow at side and turn palm up and down.      Home Exercises Program Theraputty Exercises  Do the following exercises 2 times a day using your affected hand.  1. Roll putty into a ball.  2. Make into a pancake.  3. Roll putty into a roll.  4. Pinch along log with first finger and thumb.   5. Make into a ball.  6. Roll it back into a log.   7. Pinch using thumb and side of first finger.  8. Roll into a ball, then flatten into a pancake.  9. Using your fingers, make putty into a mountain.

## 2017-11-21 NOTE — Therapy (Signed)
[facility] [address] [place], [place], [address] Phone: [phone]   Fax:  [phone]  Occupational Therapy Evaluation  Patient Details  Name: KAREEMAH GROUNDS MRN: [id number] Date of Birth: 10-Feb-1971 Referring Provider: Dorthula Nettles, DO   Encounter Date: 11/20/2017  OT End of Session - 11/21/17 1258    Visit Number  1    Number of Visits  8    Date for OT Re-Evaluation  12/21/17 mini-reassess at 2 weeks-12/03/17    Authorization Type  [employer] $40 copay    Authorization Time Period  60 visit limit combined    Authorization - Visit Number  1    Authorization - Number of Visits  60    OT Start Time  1523    OT Stop Time  1600    OT Time Calculation (min)  37 min    Activity Tolerance  Patient tolerated treatment well    Behavior During Therapy  [facility] for tasks assessed/performed       Past Medical History:  Diagnosis Date  . Asthma   . Gross hematuria   . Heartburn   . Hypertension   . Migraines     Past Surgical History:  Procedure Laterality Date  . ABDOMINAL HYSTERECTOMY    . CARPAL TUNNEL RELEASE    . CHOLECYSTECTOMY      There were no vitals filed for this visit.  Subjective Assessment - 11/20/17 2022    Subjective   S: They told me it was carpal tunnel.     Pertinent History  Pt is a 47 y/o female presenting with elbow pain beginning after having an IV during a colonoscopy procedure in March 2019. MRI was completed which shows tendinosis of the distal biceps, then tendon appearing partially torn from the bicipital tuberosity of the radius; fluid in bicipitoradial bursa. Pt also had a NCS which shows moderate carpal tunnel syndrome and ulnar sensory neuropathy.  Pt was referred to occupational therapy for evaluation and treatment by Dr. Dorthula Nettles.    Patient Stated Goals  To have less pain in my arm     Currently in Pain?  Yes    Pain Score  8     Pain Location  Elbow    Pain Orientation  Right;Anterior     Pain Descriptors / Indicators  Aching;Stabbing    Pain Type  Acute pain    Pain Radiating Towards  hand    Pain Onset  More than a month ago    Pain Frequency  Constant    Aggravating Factors   movement, work tasks    Pain Relieving Factors  rest    Effect of Pain on Daily Activities  mod effect on ADLs    Multiple Pain Sites  No        [facility] OT Assessment - 11/20/17 1525      Assessment   Medical Diagnosis  bicipitoradial bursitis of right elbow    Referring Provider  Dorthula Nettles, DO    Onset Date/Surgical Date  09/17/17    Hand Dominance  Right    Prior Therapy  No      Precautions   Precautions  None      Restrictions   Weight Bearing Restrictions  No      Balance Screen   Has the patient fallen in the past 6 months  No    Has the patient had a decrease in activity level because of a fear of falling?  No    Is the patient reluctant to leave their home because of a fear of falling?   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, pushing/pulling/repetitive motions    Leisure  yardwork-pushmowing      ADL   ADL comments  Pt is having difficulty with fixing hair, hygiene, dressing tasks, any lifting tasks-meal preparation, sleeping, carrying groceries       Written Expression   Dominant Hand  Right      Cognition   Overall Cognitive Status  Within Functional Limits for tasks assessed      ROM / Strength   AROM / PROM / Strength  AROM;PROM;Strength      AROM   Overall AROM   Within functional limits for tasks performed    AROM Assessment Site  Elbow;Forearm    Right/Left Shoulder  Right    Right/Left Elbow  Right    Right Elbow Flexion  142    Right Elbow Extension  0    Right/Left Forearm  Right    Right Forearm Pronation  90 Degrees    Right Forearm Supination  90 Degrees      PROM   Overall PROM   Within functional limits for tasks  performed    PROM Assessment Site  Elbow;Forearm    Right/Left Elbow  Right    Right/Left Forearm  Right      Strength   Strength Assessment Site  Elbow;Forearm;Hand    Right/Left Elbow  Right    Right Elbow Flexion  5/5    Right Elbow Extension  5/5    Right/Left Forearm  Right    Right Forearm Pronation  5/5    Right Forearm Supination  4+/5    Right/Left hand  Right;Left    Right Hand Grip (lbs)  35    Right Hand Lateral Pinch  10 lbs    Right Hand 3 Point Pinch  6 lbs    Left Hand Grip (lbs)  60    Left Hand Lateral Pinch  12 lbs    Left Hand 3 Point Pinch  13 lbs               OT Treatments/Exercises (OP) - 11/21/17 1550      Modalities   Modalities  Ultrasound      Ultrasound   Ultrasound Location  right bicipitoradial region    Ultrasound Parameters  1.5 w/cm2    Ultrasound Goals  Pain            OT Education - 11/21/17 1257    Education provided  Yes    Education Details  elbow ROM, grip strengthening with red putty    Person(s) Educated  Patient    Methods  Explanation;Demonstration;Handout    Comprehension  Verbalized understanding;Returned demonstration       OT Short Term Goals - 11/21/17 1545      OT SHORT TERM GOAL #1   Title  Pt will be provided with and educated on HEP to improve use of RUE as dominant during B/IADL completion.     Time  4    Period  Weeks    Status  New    Target Date  12/21/17      OT SHORT TERM GOAL #2   Title  Pt will decrease pain in RUE to improve ability to sleep  at night.     Time  4    Period  Weeks    Status  New      OT SHORT TERM GOAL #3   Title  Pt will decrease fascial restrictions in RUE to minimal amounts or less to reduce pain and improve ability to reach up and fix hair.     Time  4    Period  Weeks    Status  New      OT SHORT TERM GOAL #4   Title  Pt will improve right grip strength by 12# and pinch strength by 2# to improve ability to hold and carry items.                 Plan - 11/21/17 1320    Clinical Impression Statement  A: Pt is a 47 y/o female presenting with right elbow pain beginning in March after having an IV placed for a procedure. Pt reports constant throbbing/stabbing pain limiting her ability to use her right arm during ADLs, also feels weakness along forearm and hand. Pt is scheduled to return to work on 5/22, will have reassessment and determine if additional visits are needed after first 4 visits. [place] completed today for pain management.     Occupational Profile and client history currently impacting functional performance  Pt is independent and motivated to return to highest level of functioning in B/IADLs    Occupational performance deficits (Please refer to evaluation for details):  ADL's;IADL's;Rest and Sleep;Work;Leisure    Rehab Potential  Good    OT Frequency  2x / week    OT Duration  4 weeks    OT Treatment/Interventions  Self-care/ADL training;Therapeutic exercise;Ultrasound;Manual Therapy;Therapeutic activities;Cryotherapy;Paraffin;Electrical Stimulation;Moist Heat;Passive range of motion;Patient/family education    Plan  P: Pt will benefit from skilled OT services to decrease pain and fascial restrictions, increase strength and grip/pinch strength in RUE for improved ability to complete functional tasks. Treatment plan: myofascial release, manual therapy, A/ROM, grip and pinch strengthening, modalities as needed.     Clinical Decision Making  Limited treatment options, no task modification necessary    Consulted and Agree with Plan of Care  Patient       Patient will benefit from skilled therapeutic intervention in order to improve the following deficits and impairments:  Decreased activity tolerance, Decreased strength, Increased edema, Pain, Increased fascial restrictions, Impaired UE functional use  Visit Diagnosis: Pain in right elbow  Other symptoms and signs involving the musculoskeletal system    Problem  List Patient Active Problem List   Diagnosis Date Noted  . Migraine 01/08/2016  . Muscle cramp 01/08/2016  . HTN (hypertension) 01/08/2016   Ezra Sites, [name]  [phone] 11/21/2017, 5:55 PM  [facility] [address] [place], [place], [address] Phone: [phone]   Fax:  [phone]  Name: WILLADENE MOUNSEY MRN: [id number] Date of Birth: June 13, 1971

## 2017-11-23 ENCOUNTER — Other Ambulatory Visit: Payer: Self-pay

## 2017-11-23 ENCOUNTER — Encounter (HOSPITAL_COMMUNITY): Payer: Self-pay

## 2017-11-23 ENCOUNTER — Ambulatory Visit (HOSPITAL_COMMUNITY): Payer: BLUE CROSS/BLUE SHIELD

## 2017-11-23 DIAGNOSIS — M25521 Pain in right elbow: Secondary | ICD-10-CM | POA: Diagnosis not present

## 2017-11-23 DIAGNOSIS — R29898 Other symptoms and signs involving the musculoskeletal system: Secondary | ICD-10-CM | POA: Diagnosis present

## 2017-11-23 NOTE — Therapy (Signed)
[facility] [address] [place], [place], [address] Phone: [phone]   Fax:  [phone]  Occupational Therapy Treatment  Patient Details  Name: Mckenzie Anderson MRN: [id number] Date of Birth: [birth year] Referring Provider: Dorthula Nettles, DO   Encounter Date: 11/23/2017  OT End of Session - 11/23/17 1044    Visit Number  2    Number of Visits  8    Date for OT Re-Evaluation  12/21/17 mini-reassess at 2 weeks-12/03/17    Authorization Type  [employer] $40 copay    Authorization Time Period  60 visit limit combined    Authorization - Visit Number  2    Authorization - Number of Visits  60    OT Start Time  [phone]    OT Stop Time  0945    OT Time Calculation (min)  40 min    Activity Tolerance  Patient tolerated treatment well    Behavior During Therapy  [facility] for tasks assessed/performed       Past Medical History:  Diagnosis Date  . Asthma   . Gross hematuria   . Heartburn   . Hypertension   . Migraines     Past Surgical History:  Procedure Laterality Date  . ABDOMINAL HYSTERECTOMY    . CARPAL TUNNEL RELEASE    . CHOLECYSTECTOMY      There were no vitals filed for this visit.  Subjective Assessment - 11/23/17 0939    Subjective   S: The IV did this to me.     Special Tests  FOTO score: 50/100    Currently in Pain?  Yes    Pain Score  6     Pain Location  Elbow    Pain Orientation  Right    Pain Descriptors / Indicators  Aching    Pain Type  Acute pain         [facility] OT Assessment - 11/23/17 0940      Assessment   Medical Diagnosis  bicipitoradial bursitis of right elbow      Precautions   Precautions  None      Observation/Other Assessments   Focus on Therapeutic Outcomes (FOTO)   50/100               OT Treatments/Exercises (OP) - 11/23/17 0946      Exercises   Exercises  Wrist;Hand      Additional Wrist Exercises   Sponges  32    Hand Gripper with Large Beads  all beads with gripper set at  25#    Hand Gripper with Medium Beads  all beads with gripper set at 25#     Hand Gripper with Small Beads  all beads with gripper set at 25#      Hand Exercises   Other Hand Exercises  utilizing green resistive clothespin to pick up 30 sponges with a 3 point pinch.       Modalities   Modalities  Ultrasound      Ultrasound   Ultrasound Location  right brachioradialis    Ultrasound Parameters  1.5 W/cm2 5 minutes    Ultrasound Goals  Pain      Manual Therapy   Manual Therapy  Soft tissue mobilization    Manual therapy comments  Manual therapy completed prior to exercises.     Soft tissue mobilization  Myofascial release and soft tissue mobilization completed to right lateral elbow region to decrease trigger points and fascial restrictions and allow for an increase  in comfort              OT Education - 11/23/17 1040    Education provided  Yes    Education Details  Pt was provided with OT evaluation print out. Reviewed goals. Plan of care. Extensive education provided regarding diagonsis, anatomy, and what to expect from therapy treatment.     Person(s) Educated  Patient    Methods  Explanation;Handout    Comprehension  Verbalized understanding       OT Short Term Goals - 11/23/17 1045      OT SHORT TERM GOAL #1   Title  Pt will be provided with and educated on HEP to improve use of RUE as dominant during B/IADL completion.     Time  4    Period  Weeks    Status  On-going      OT SHORT TERM GOAL #2   Title  Pt will decrease pain in RUE to improve ability to sleep at night.     Time  4    Period  Weeks    Status  On-going      OT SHORT TERM GOAL #3   Title  Pt will decrease fascial restrictions in RUE to minimal amounts or less to reduce pain and improve ability to reach up and fix hair.     Time  4    Period  Weeks    Status  On-going      OT SHORT TERM GOAL #4   Title  Pt will improve right grip strength by 12# and pinch strength by 2# to improve ability to  hold and carry items.     Status  On-going               Plan - 11/23/17 1045    Clinical Impression Statement  A: Initiated myofascial release and grip and pinch strengthening exercises. patient received education on anatomy of diagnosis and possible causes as well as prognosis with therapy. VC were needed for form and technique during exercises. Pt with a large trigger point on lateral epicondyle and manual therapy was completed to address.     Plan  P: Follow up on HEP. Continue with [place] at beginning of session for pain. Continue with myofascial release an dgrip and pinch strengthening.     Consulted and Agree with Plan of Care  Patient       Patient will benefit from skilled therapeutic intervention in order to improve the following deficits and impairments:  Decreased activity tolerance, Decreased strength, Increased edema, Pain, Increased fascial restrictions, Impaired UE functional use  Visit Diagnosis: Pain in right elbow  Other symptoms and signs involving the musculoskeletal system    Problem List Patient Active Problem List   Diagnosis Date Noted  . Migraine 01/08/2016  . Muscle cramp 01/08/2016  . HTN (hypertension) 01/08/2016   Mckenzie Anderson, [name],[name]  [phone]  11/23/2017, 11:06 AM  [place] [facility] [address] [place], [place], [address] Phone: [phone]   Fax:  [phone]  Name: Mckenzie Anderson MRN: [id number] Date of Birth: 11-28-1970

## 2017-11-28 ENCOUNTER — Ambulatory Visit (HOSPITAL_COMMUNITY): Payer: BLUE CROSS/BLUE SHIELD

## 2017-11-28 DIAGNOSIS — M25521 Pain in right elbow: Secondary | ICD-10-CM | POA: Diagnosis not present

## 2017-11-28 DIAGNOSIS — R29898 Other symptoms and signs involving the musculoskeletal system: Secondary | ICD-10-CM

## 2017-11-28 NOTE — Therapy (Signed)
[name] [address] [place], [place], [address] Phone: [phone]   Fax:  [phone]  Occupational Therapy Treatment  Patient Details  Name: Mckenzie Anderson MRN: [id number] Date of Birth: 1971/02/04 Referring Provider: Dorthula Nettles, DO   Encounter Date: 11/28/2017  OT End of Session - 11/28/17 1731    Visit Number  3    Number of Visits  8    Date for OT Re-Evaluation  12/21/17 mini-reassess at 2 weeks-12/03/17    Authorization Type  [employer] $40 copay    Authorization Time Period  60 visit limit combined    Authorization - Visit Number  3    Authorization - Number of Visits  60    OT Start Time  1640    OT Stop Time  1720    OT Time Calculation (min)  40 min    Activity Tolerance  Patient tolerated treatment well    Behavior During Therapy  [facility] for tasks assessed/performed       Past Medical History:  Diagnosis Date  . Asthma   . Gross hematuria   . Heartburn   . Hypertension   . Migraines     Past Surgical History:  Procedure Laterality Date  . ABDOMINAL HYSTERECTOMY    . CARPAL TUNNEL RELEASE    . CHOLECYSTECTOMY      There were no vitals filed for this visit.  Subjective Assessment - 11/28/17 1643    Currently in Pain?  Yes    Pain Score  6     Pain Location  Elbow    Pain Orientation  Right    Pain Descriptors / Indicators  Sharp;Aching    Pain Type  Acute pain    Pain Radiating Towards  hand    Pain Onset  More than a month ago    Pain Frequency  Constant    Aggravating Factors   movement, work tasks    Pain Relieving Factors  nothing right now.    Effect of Pain on Daily Activities  mod effect    Multiple Pain Sites  No         [facility] OT Assessment - 11/28/17 1731      Assessment   Medical Diagnosis  bicipitoradial bursitis of right elbow      Precautions   Precautions  None               OT Treatments/Exercises (OP) - 11/28/17 1702      Exercises   Exercises   Wrist;Hand;Theraputty      Additional Wrist Exercises   Hand Gripper with Large Beads  all beads with gripper set at 29#    Hand Gripper with Medium Beads  all beads with gripper set at 29#     Hand Gripper with Small Beads  all beads with gripper set at 29#      Hand Exercises   Other Hand Exercises  Patient utilized pvc pipe to press circles into red putty focusing on grip strength.       Theraputty   Theraputty - Flatten  red    Theraputty - Roll  red    Theraputty - Grip  red - pronated grip    Theraputty - Pinch  3 point pinch - red      Modalities   Modalities  Ultrasound      Ultrasound   Ultrasound Location  right brachioradialis    Ultrasound Parameters  1.5 W/cm2    Ultrasound Goals  Pain      Manual Therapy   Manual Therapy  Soft tissue mobilization    Manual therapy comments  Manual therapy completed prior to exercises.     Soft tissue mobilization  Myofascial release and soft tissue mobilization completed to right lateral elbow region to decrease trigger points and fascial restrictions and allow for an increase in comfort                OT Short Term Goals - 11/23/17 1045      OT SHORT TERM GOAL #1   Title  Pt will be provided with and educated on HEP to improve use of RUE as dominant during B/IADL completion.     Time  4    Period  Weeks    Status  On-going      OT SHORT TERM GOAL #2   Title  Pt will decrease pain in RUE to improve ability to sleep at night.     Time  4    Period  Weeks    Status  On-going      OT SHORT TERM GOAL #3   Title  Pt will decrease fascial restrictions in RUE to minimal amounts or less to reduce pain and improve ability to reach up and fix hair.     Time  4    Period  Weeks    Status  On-going      OT SHORT TERM GOAL #4   Title  Pt will improve right grip strength by 12# and pinch strength by 2# to improve ability to hold and carry items.     Status  On-going               Plan - 11/28/17 1731     Clinical Impression Statement  A: Pt reports that her pain level has not changed since last session. She continues to have increased pain with RUE use. She reports that she is completing her HEP. [place] and manual therapy techniques were utilized for pain mangement. Increased gripper strength to 29#. Pt was able to complete with moderate difficulty and pain.     Plan  P: Patient may want measurements take at next session as she plans to return to [place] at the beginning of next week. Continue with grip and pinch strengthening. Remain at 29# for handgripper.     Consulted and Agree with Plan of Care  Patient       Patient will benefit from skilled therapeutic intervention in order to improve the following deficits and impairments:  Decreased activity tolerance, Decreased strength, Increased edema, Pain, Increased fascial restrictions, Impaired UE functional use  Visit Diagnosis: Pain in right elbow  Other symptoms and signs involving the musculoskeletal system    Problem List Patient Active Problem List   Diagnosis Date Noted  . Migraine 01/08/2016  . Muscle cramp 01/08/2016  . HTN (hypertension) 01/08/2016   [name], [name],[name]  [phone]  11/28/2017, 5:35 PM   [facility] [address] [place], [place], [address] Phone: [phone]   Fax:  [phone]  Name: Mckenzie Anderson MRN: [id number] Date of Birth: September 12, 1970

## 2017-11-30 ENCOUNTER — Ambulatory Visit (HOSPITAL_COMMUNITY): Payer: BLUE CROSS/BLUE SHIELD | Admitting: Occupational Therapy

## 2017-11-30 ENCOUNTER — Encounter (HOSPITAL_COMMUNITY): Payer: Self-pay | Admitting: Occupational Therapy

## 2017-11-30 DIAGNOSIS — M25521 Pain in right elbow: Secondary | ICD-10-CM

## 2017-11-30 DIAGNOSIS — R29898 Other symptoms and signs involving the musculoskeletal system: Secondary | ICD-10-CM

## 2017-11-30 NOTE — Patient Instructions (Signed)
Complete 1-2x per day, 10 times each   Theraband Exercises  1) Elbow flexion and extension With your arm at your side holding an elastic band, draw up your hand by bending at the elbow. Keep your palm face up. Then complete again with thumb facing forward   2) Wrist extension: -Fix the band firmly under your foot and hold the other end in your affected hand/arm.  -Place your elbow over your knee and let your affected wrist hang towards the floor, palm down. Pull wrist back   3) Forearm supination -Fix band firmly under foot, hold other end in affected hand/arm.  -Place elbow on knee, with affected hand/arm palm down. Turn hand palm up on table or knee.     4) Pronation -Fix band firmly under foot, hold other end in affected hand/arm.  -Place elbow on knee, with affected hand/arm palm up. Turn hand palm down on table or knee.

## 2017-11-30 NOTE — Therapy (Signed)
[facility] [address] [place], [place], [address] Phone: [phone]   Fax:  [phone]  Occupational Therapy Treatment (mini-reassessment)  Patient Details  Name: Mckenzie Anderson MRN: [id number] Date of Birth: [birth year] Referring Provider: Dorthula Nettles, DO   Encounter Date: 11/30/2017  OT End of Session - 11/30/17 1530    Visit Number  4    Number of Visits  8    Date for OT Re-Evaluation  12/21/17    Authorization Type  [employer] $40 copay    Authorization Time Period  60 visit limit combined    Authorization - Visit Number  4    Authorization - Number of Visits  60    OT Start Time  1300    OT Stop Time  1345    OT Time Calculation (min)  45 min    Activity Tolerance  Patient tolerated treatment well    Behavior During Therapy  [facility] for tasks assessed/performed       Past Medical History:  Diagnosis Date  . Asthma   . Gross hematuria   . Heartburn   . Hypertension   . Migraines     Past Surgical History:  Procedure Laterality Date  . ABDOMINAL HYSTERECTOMY    . CARPAL TUNNEL RELEASE    . CHOLECYSTECTOMY      There were no vitals filed for this visit.  Subjective Assessment - 11/30/17 1300    Subjective   S: I've got to call and make an appointment for next week.     Currently in Pain?  Yes    Pain Score  6     Pain Location  Elbow    Pain Orientation  Right    Pain Descriptors / Indicators  Aching;Sore;Sharp    Pain Type  Acute pain    Pain Radiating Towards  hand    Pain Onset  More than a month ago    Pain Frequency  Constant    Aggravating Factors   movement, work tasks    Pain Relieving Factors  nothing right now    Effect of Pain on Daily Activities  mod effect    Multiple Pain Sites  No         [facility] OT Assessment - 11/30/17 1300      Assessment   Medical Diagnosis  bicipitoradial bursitis of right elbow      Precautions   Precautions  None      PROM   Overall PROM   Within functional  limits for tasks performed      Strength   Right/Left Elbow  Right    Right Elbow Flexion  4+/5 5/5 previous    Right Elbow Extension  4+/5 5/5 previous    Right/Left Forearm  Right    Right Forearm Pronation  5/5 same as previous    Right Forearm Supination  4+/5 same as previous    Right/Left hand  Right    Right Hand Grip (lbs)  42 34 previous    Right Hand Lateral Pinch  8 lbs 10 previous    Right Hand 3 Point Pinch  6 lbs same as previous               OT Treatments/Exercises (OP) - 11/30/17 1304      Exercises   Exercises  Wrist;Hand;Theraputty;Elbow      Elbow Exercises   Elbow Flexion  Theraband;10 reps bicep curl and hammer curl    Theraband Level (Elbow Flexion)  Level 2 (Red)    Forearm Supination  Theraband;10 reps    Theraband Level (Supination)  Level 2 (Red)    Forearm Pronation  Theraband;10 reps    Theraband Level (Pronation)  Level 2 (Red)      Additional Elbow Exercises   Hand Gripper with Large Beads  all beads with gripper set at 29#    Hand Gripper with Medium Beads  all beads with gripper set at 29#     Hand Gripper with Small Beads  all beads with gripper set at 29#      Wrist Exercises   Wrist Flexion  Theraband;10 reps    Theraband Level (Wrist Flexion)  Level 2 (Red)    Wrist Extension  Theraband;10 reps    Theraband Level (Wrist Extension)  Level 2 (Red)      Modalities   Modalities  Ultrasound      Ultrasound   Ultrasound Location  right brachioradialis    Ultrasound Parameters  1.5 W/cm2    Ultrasound Goals  Pain      Manual Therapy   Manual Therapy  Soft tissue mobilization    Manual therapy comments  Manual therapy completed prior to exercises.     Soft tissue mobilization  Myofascial release and soft tissue mobilization completed to right lateral elbow region to decrease trigger points and fascial restrictions and allow for an increase in comfort              OT Education - 11/30/17 1528    Education provided  Yes     Education Details  theraband exercises-wrist, elbow, forearm    Person(s) Educated  Patient    Methods  Explanation;Demonstration;Handout    Comprehension  Returned demonstration;Verbalized understanding       OT Short Term Goals - 11/23/17 1045      OT SHORT TERM GOAL #1   Title  Pt will be provided with and educated on HEP to improve use of RUE as dominant during B/IADL completion.     Time  4    Period  Weeks    Status  On-going      OT SHORT TERM GOAL #2   Title  Pt will decrease pain in RUE to improve ability to sleep at night.     Time  4    Period  Weeks    Status  On-going      OT SHORT TERM GOAL #3   Title  Pt will decrease fascial restrictions in RUE to minimal amounts or less to reduce pain and improve ability to reach up and fix hair.     Time  4    Period  Weeks    Status  On-going      OT SHORT TERM GOAL #4   Title  Pt will improve right grip strength by 12# and pinch strength by 2# to improve ability to hold and carry items.     Status  On-going               Plan - 11/30/17 1531    Clinical Impression Statement  A: Pt reports her pain has not changed, she is going to call and make an appointment with the MD for next week. Mini-reassessment completed today for potential upcoming appt. Pt has made progress with grip and lateral pinch strength, elbow strength has decreased slightly while forearm strength has remained the same. Added red theraband exercises for wrist/elbow/forearm today, verbal cuing for form and technique.  Plan  P: follow up on MD appt-if made. Follow up on theraband exercises and continue       Patient will benefit from skilled therapeutic intervention in order to improve the following deficits and impairments:  Decreased activity tolerance, Decreased strength, Increased edema, Pain, Increased fascial restrictions, Impaired UE functional use  Visit Diagnosis: Pain in right elbow  Other symptoms and signs involving the  musculoskeletal system    Problem List Patient Active Problem List   Diagnosis Date Noted  . Migraine 01/08/2016  . Muscle cramp 01/08/2016  . HTN (hypertension) 01/08/2016   Ezra Sites, [name]  [phone] 11/30/2017, 3:37 PM  [facility] [address] [place], [place], [address] Phone: [phone]   Fax:  [phone]  Name: Mckenzie Anderson MRN: [id number] Date of Birth: [birth year]

## 2017-12-03 ENCOUNTER — Telehealth (HOSPITAL_COMMUNITY): Payer: Self-pay

## 2017-12-03 ENCOUNTER — Ambulatory Visit (HOSPITAL_COMMUNITY): Payer: BLUE CROSS/BLUE SHIELD

## 2017-12-03 NOTE — Telephone Encounter (Signed)
Patient had a cortisone shot and was told to cx her appt s for this week until they see how well it works

## 2017-12-18 ENCOUNTER — Other Ambulatory Visit: Payer: Self-pay

## 2017-12-18 ENCOUNTER — Encounter (HOSPITAL_COMMUNITY): Payer: Self-pay

## 2017-12-18 ENCOUNTER — Ambulatory Visit (HOSPITAL_COMMUNITY): Payer: BLUE CROSS/BLUE SHIELD | Attending: Sports Medicine

## 2017-12-18 DIAGNOSIS — R29898 Other symptoms and signs involving the musculoskeletal system: Secondary | ICD-10-CM | POA: Diagnosis present

## 2017-12-18 DIAGNOSIS — M25521 Pain in right elbow: Secondary | ICD-10-CM | POA: Insufficient documentation

## 2017-12-18 NOTE — Therapy (Addendum)
[facility] [address] [place], [place], [address] Phone: [phone]   Fax:  [phone]  Occupational Therapy Treatment  Patient Details  Name: Mckenzie Anderson MRN: [id number] Date of Birth: Oct 21, 1970 Referring Provider: Dorthula NettlesAndrew Kubinski, DO   Encounter Date: 12/18/2017  OT End of Session - 12/18/17 1610    Visit Number  5    Number of Visits  8    Date for OT Re-Evaluation  12/21/17    Authorization Type  [employer] $40 copay    Authorization Time Period  60 visit limit combined    Authorization - Visit Number  5    Authorization - Number of Visits  60    OT Start Time  1603    OT Stop Time  1645    OT Time Calculation (min)  42 min    Activity Tolerance  Patient tolerated treatment well    Behavior During Therapy  [facility] assessed/per[facility]       Past Medical History:  Diagnosis Date  . Asthma   . Gross hematuria   . Heartburn   . Hypertension   . Migraines     Past Surgical History:  Procedure Laterality Date  . ABDOMINAL HYSTERECTOMY    . CARPAL TUNNEL RELEASE    . CHOLECYSTECTOMY      There were no vitals filed for this visit.  Subjective Assessment - 12/18/17 1607    Subjective   S: They gave me a cortisone shot so I don't understand why it's still hurting.    Currently in Pain?  Yes    Pain Score  5     Pain Location  Elbow    Pain Orientation  Right    Pain Descriptors / Indicators  Sharp    Pain Type  Acute pain    Pain Radiating Towards  N/A    Pain Onset  More than a month ago    Pain Frequency  Constant    Aggravating Factors   movement, work tasks    Pain Relieving Factors  N/A    Effect of Pain on Daily Activities  moderate effect on ADLs    Multiple Pain Sites  No         [facility] OT Assessment - 12/18/17 1609      Assessment   Medical Diagnosis  bicipitoradial bursitis of right elbow      Precautions   Precautions  None               OT Treatments/Exercises (OP) - 12/18/17  1609      Exercises   Exercises  Wrist;Hand;Theraputty;Elbow      Elbow Exercises   Elbow Flexion  Theraband;15 reps    Theraband Level (Elbow Flexion)  Level 2 (Red)    Forearm Supination  Theraband;15 reps    Theraband Level (Supination)  Level 2 (Red)    Forearm Pronation  Theraband;15 reps    Theraband Level (Pronation)  Level 2 (Red)      Additional Elbow Exercises   Hand Gripper with Large Beads  all beads with gripper set at 29#    Hand Gripper with Medium Beads  all beads with gripper set at 29#     Hand Gripper with Small Beads  all beads with gripper set at 29#      Wrist Exercises   Wrist Flexion  AROM;10 reps;Theraband;15 reps    Theraband Level (Wrist Flexion)  Level 2 (Red)    Wrist Extension  AROM;10 reps;Theraband;15 reps    Theraband Level (Wrist Extension)  Level 2 (Red)    Wrist Radial Deviation  AROM;10 reps    Wrist Ulnar Deviation  AROM;10 reps    Other wrist exercises  wrist pronation and supination; 10x each      Modalities   Modalities  Ultrasound      Ultrasound   Ultrasound Location  right brachioradialis    Ultrasound Parameters  1.5 W/cm2    Ultrasound Goals  Pain      Manual Therapy   Manual Therapy  Soft tissue mobilization    Manual therapy comments  Manual therapy completed prior to exercises.     Soft tissue mobilization  Myofascial release and soft tissue mobilization completed to right lateral elbow region to decrease trigger points and fascial restrictions and allow for an increase in comfort              OT Education - 12/18/17 1610    Education provided  No       OT Short Term Goals - 11/23/17 1045      OT SHORT TERM GOAL #1   Title  Pt will be provided with and educated on HEP to improve use of RUE as dominant during B/IADL completion.     Time  4    Period  Weeks    Status  On-going      OT SHORT TERM GOAL #2   Title  Pt will decrease pain in RUE to improve ability to sleep at night.     Time  4    Period  Weeks     Status  On-going      OT SHORT TERM GOAL #3   Title  Pt will decrease fascial restrictions in RUE to minimal amounts or less to reduce pain and improve ability to reach up and fix hair.     Time  4    Period  Weeks    Status  On-going      OT SHORT TERM GOAL #4   Title  Pt will improve right grip strength by 12# and pinch strength by 2# to improve ability to hold and carry items.     Status  On-going               Plan - 12/18/17 1611    Clinical Impression Statement  A: Pt reports that MD would like her to continue therapy and return for appintment on 06/19. She reports getting a cortisone shot but with no decrease in pain. Patient plans to return to work in a couple weeks to see if she can tolerate it. She reports she has been completing the theraband and theraputty exercises at home. Decreased fascial restrictions/trigger points palpated this session and less edema observed in right elbow. Pt able to increase reps for wrist and elbow red theraband exercises with moderate VC for form and technique.    Plan  P: Reassessment/Recert due. Continue with ultrasound for pain management and manual techniques for trigger point release and soft tissue mobilization. Progress to using green theraband for elbow and wrist exercises.    Consulted and Agree with Plan of Care  Patient       Patient will benefit from skilled therapeutic intervention in order to improve the following deficits and impairments:  Decreased activity tolerance, Decreased strength, Increased edema, Pain, Increased fascial restrictions, Impaired UE functional use  Visit Diagnosis: Pain in right elbow  Other symptoms and signs involving the musculoskeletal system  Problem List Patient Active Problem List   Diagnosis Date Noted  . Migraine 01/08/2016  . Muscle cramp 01/08/2016  . HTN (hypertension) 01/08/2016    Holley Raring, OT student 12/18/2017, 5:11 PM  [facility] [address] [place], [place], [address] Phone: [phone]   Fax:  [phone]  Name: GLENDER AUGUSTA MRN: [id number] Date of Birth: 05-Dec-1970

## 2017-12-21 ENCOUNTER — Ambulatory Visit (HOSPITAL_COMMUNITY): Payer: BLUE CROSS/BLUE SHIELD | Admitting: Specialist

## 2017-12-21 ENCOUNTER — Encounter (HOSPITAL_COMMUNITY): Payer: Self-pay | Admitting: Specialist

## 2017-12-21 DIAGNOSIS — R29898 Other symptoms and signs involving the musculoskeletal system: Secondary | ICD-10-CM

## 2017-12-21 DIAGNOSIS — M25521 Pain in right elbow: Secondary | ICD-10-CM

## 2017-12-21 NOTE — Patient Instructions (Signed)
    Complete each stretch 2-3 times per day, holding for 15-30 seconds each

## 2017-12-21 NOTE — Therapy (Signed)
[facility] [place], [place], [address] Phone: [phone]   Fax:  [phone]  Occupational Therapy Treatment  Patient Details  Name: Mckenzie Anderson MRN: [id number] Date of Birth: 11-20-1970 Referring Provider: Rosalia Hammers, DO   Encounter Date: 12/21/2017  OT End of Session - 12/21/17 1337    Visit Number  6    Number of Visits  8    Date for OT Re-Evaluation  12/21/17    Authorization Type  [employer] $40 copay    Authorization Time Period  60 visit limit combined    Authorization - Visit Number  6    Authorization - Number of Visits  61    OT Start Time  1030    OT Stop Time  1110    OT Time Calculation (min)  40 min    Activity Tolerance  Patient tolerated treatment well    Behavior During Therapy  [facility] for tasks assessed/performed       Past Medical History:  Diagnosis Date  . Asthma   . Gross hematuria   . Heartburn   . Hypertension   . Migraines     Past Surgical History:  Procedure Laterality Date  . ABDOMINAL HYSTERECTOMY    . CARPAL TUNNEL RELEASE    . CHOLECYSTECTOMY      There were no vitals filed for this visit.  Subjective Assessment - 12/21/17 1335    Subjective   S:  I don't really know if the therapy is helping.  I still have alot of pain and feel like something is stuck in my arm.     Special Tests  FOTO score improved to 57/100    Currently in Pain?  Yes    Pain Score  7     Pain Location  Elbow    Pain Orientation  Right;Other (Comment) volar    Pain Descriptors / Indicators  Sharp    Pain Type  Acute pain         [facility] OT Assessment - 12/21/17 0001      Assessment   Medical Diagnosis  bicipitoradial bursitis of right elbow      Precautions   Precautions  None      Observation/Other Assessments   Skin Integrity  fascial restrictions are minimal    Focus on Therapeutic Outcomes (FOTO)   57/100      AROM   Right Elbow Flexion  142 142    Right Elbow Extension  0 0    Right Forearm Pronation  90 Degrees 90    Right Forearm Supination  90 Degrees 90      Strength   Right Elbow Flexion  4+/5 4+/5    Right Elbow Extension  4+/5 4+/5    Right Forearm Pronation  5/5 5/5    Right Forearm Supination  4+/5 4+/5    Right Hand Grip (lbs)  40 42    Right Hand Lateral Pinch  8 lbs 8    Right Hand 3 Point Pinch  6 lbs 6               OT Treatments/Exercises (OP) - 12/21/17 0001      Exercises   Exercises  Wrist;Hand;Theraputty;Elbow      Manual Therapy   Manual Therapy  Myofascial release    Manual therapy comments  Manual therapy completed prior to exercises.     Soft tissue mobilization  Myofascial release and soft tissue mobilization completed to right lateral  elbow region to decrease trigger points and fascial restrictions and allow for an increase in comfort              OT Education - 12/21/17 1336    Education provided  Yes    Education Details  reviewed wall stretches for wrist flexion, wrist extension. radial deviation    Person(s) Educated  Patient    Methods  Explanation;Demonstration;Handout    Comprehension  Verbalized understanding;Returned demonstration       OT Short Term Goals - 12/21/17 1102      OT SHORT TERM GOAL #1   Title  Pt will be provided with and educated on HEP to improve use of RUE as dominant during B/IADL completion.     Time  4    Period  Weeks    Status  Achieved      OT SHORT TERM GOAL #2   Title  Pt will decrease pain in RUE to improve ability to sleep at night.     Time  4    Period  Weeks    Status  Not Met      OT SHORT TERM GOAL #3   Title  Pt will decrease fascial restrictions in RUE to minimal amounts or less to reduce pain and improve ability to reach up and fix hair.     Time  4    Period  Weeks    Status  Not Met      OT SHORT TERM GOAL #4   Title  Pt will improve right grip strength by 12# and pinch strength by 2# to improve ability to hold and carry items.     Status  On-going                Plan - 12/21/17 1338    Clinical Impression Statement  A:  Reassessment completed this date, patient has recieved 6 visits over the past 4 weeks.  Reassessment findings show no improvement from initial evaluation in A/ROM, strength, or grip strength.  Her pain level is also the same as at initial evaluation.  Her fascial restrictions have decreased, however, this has not allowed for less pain and greater mobility, or improved participation with daily tasks.    OT Treatment/Interventions  Self-care/ADL training;Therapeutic exercise;Ultrasound;Manual Therapy;Therapeutic activities;Cryotherapy;Paraffin;Electrical Stimulation;Moist Heat;Passive range of motion;Patient/family education    Plan  P:  DC from skilled OT intervention this date, as patient has not made significant progress towards her goals in occupation therapy.  Recommend returning to referring physician to discuss next steps.    Consulted and Agree with Plan of Care  Patient       Patient will benefit from skilled therapeutic intervention in order to improve the following deficits and impairments:     Visit Diagnosis: Pain in right elbow  Other symptoms and signs involving the musculoskeletal system    Problem List Patient Active Problem List   Diagnosis Date Noted  . Migraine 01/08/2016  . Muscle cramp 01/08/2016  . HTN (hypertension) 01/08/2016    Vangie Bicker, [place], [name] [phone]  12/21/2017, 1:44 PM OCCUPATIONAL THERAPY DISCHARGE SUMMARY  Visits from Start of Care: 6  Current functional level related to goals / functional outcomes: Unable to use right arm as dominant with desired B/IADLs, leisure activities    Remaining deficits: Decreased arm hand hand strength, increased pain in right arm   Education / Equipment: See above Plan: Patient agrees to discharge.  Patient goals were not met. Patient is being discharged  due to lack of progress.  ?????         Vangie Bicker, [place], [name] [facility] [address] [place], [place], [address] Phone: [phone]   Fax:  [phone]  Name: Mckenzie Anderson MRN: [id number] Date of Birth: 13-Jul-1971

## 2018-01-11 DIAGNOSIS — S46211A Strain of muscle, fascia and tendon of other parts of biceps, right arm, initial encounter: Secondary | ICD-10-CM | POA: Insufficient documentation

## 2018-01-11 DIAGNOSIS — G629 Polyneuropathy, unspecified: Secondary | ICD-10-CM

## 2018-01-11 HISTORY — DX: Strain of muscle, fascia and tendon of other parts of biceps, right arm, initial encounter: S46.211A

## 2018-01-11 HISTORY — DX: Polyneuropathy, unspecified: G62.9

## 2018-01-24 ENCOUNTER — Telehealth (HOSPITAL_COMMUNITY): Payer: Self-pay | Admitting: Specialist

## 2018-01-24 NOTE — Telephone Encounter (Signed)
Pt came by to pick up medical records - scanned release form and gave pt her files. NF 01/24/2018

## 2019-11-25 IMAGING — US US EXTREM  UP VENOUS*R*
1 series · 13 of 24 positions shown · non-contrast
Comparison: None.

CLINICAL DATA: 46-year-old female with right upper extremity pain
for the past several weeks



[Series 1: us extrem up venous*right* · 0.08mm/px · 13 of 34 slices shown]
[im 1/34]
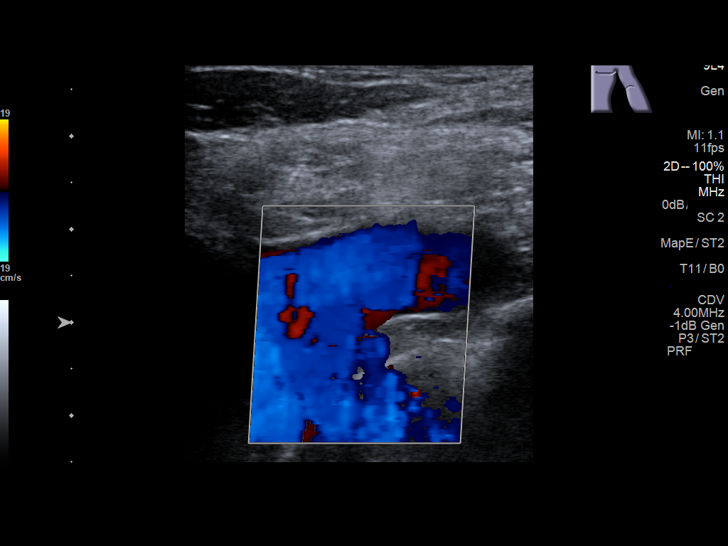
[im 3/34]
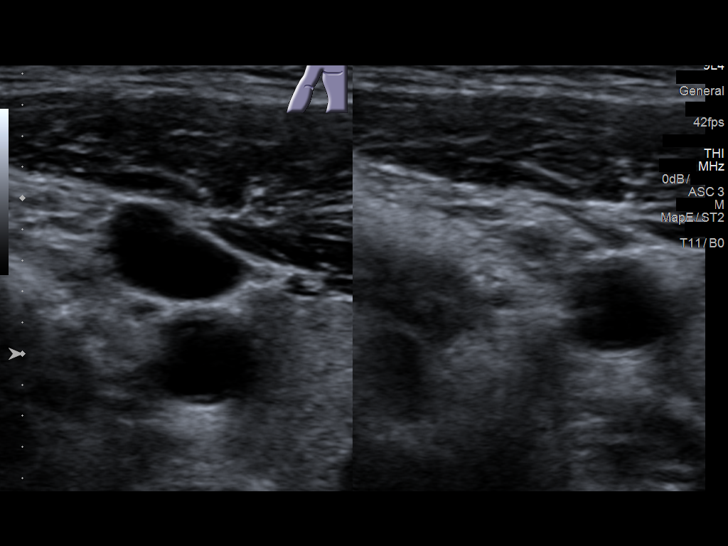
[im 6/34]
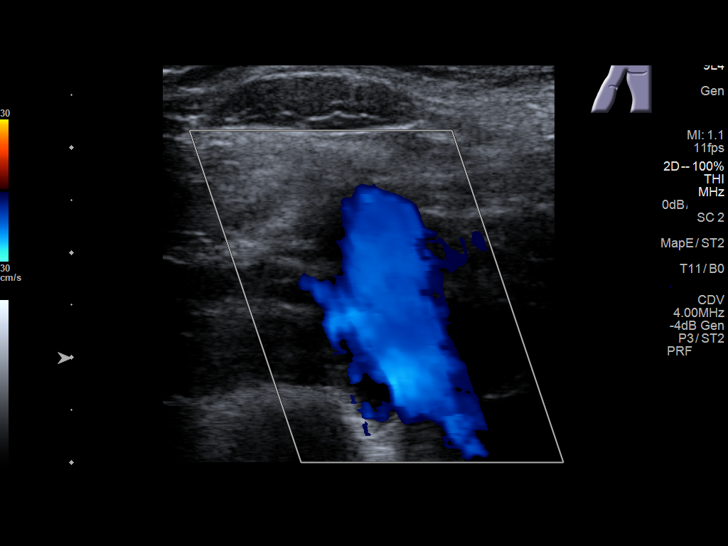
[im 9/34]
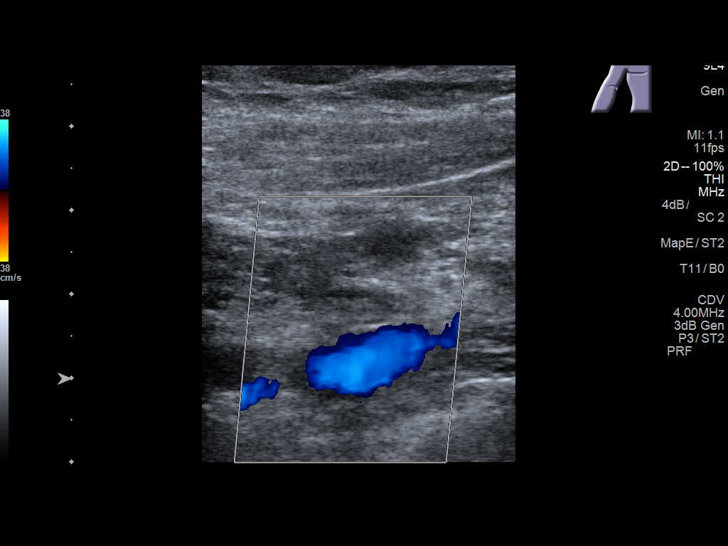
[im 12/34]
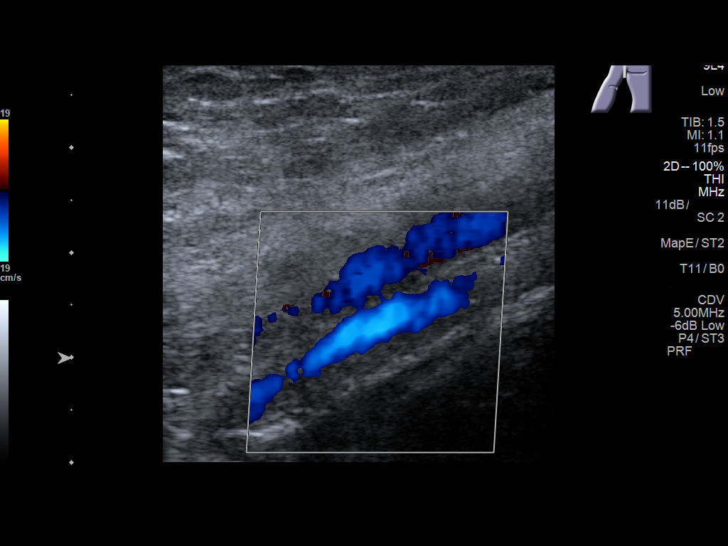
[im 15/34]
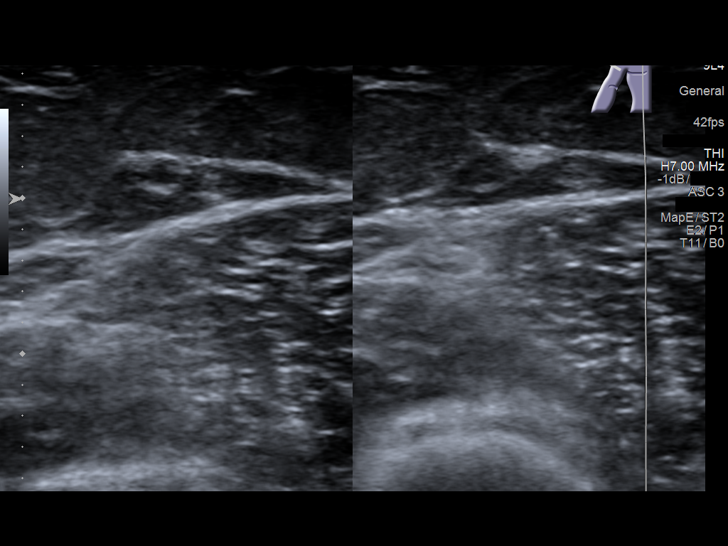
[im 18/34]
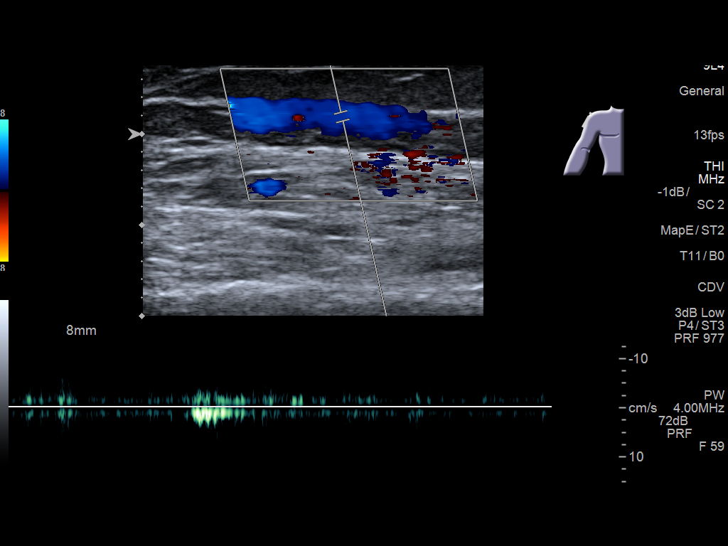
[im 19/34]
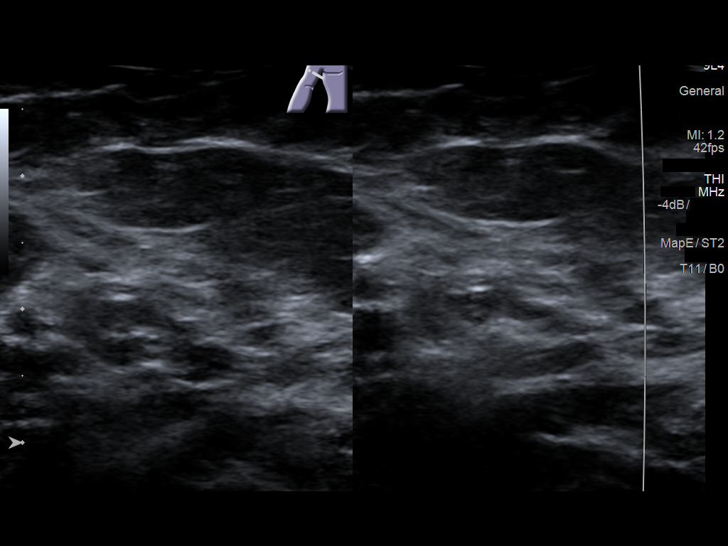
[im 22/34]
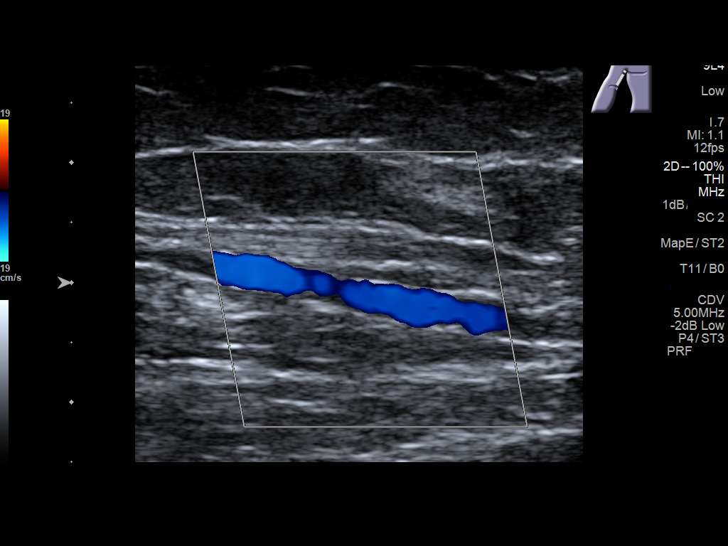
[im 25/34]
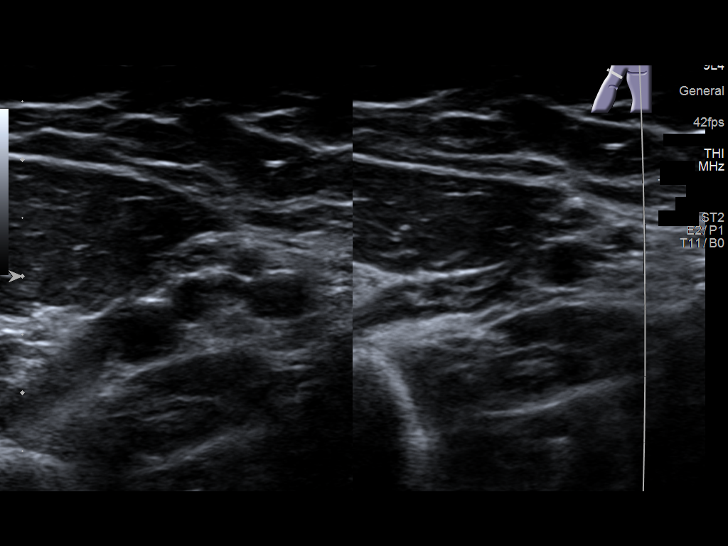
[im 28/34]
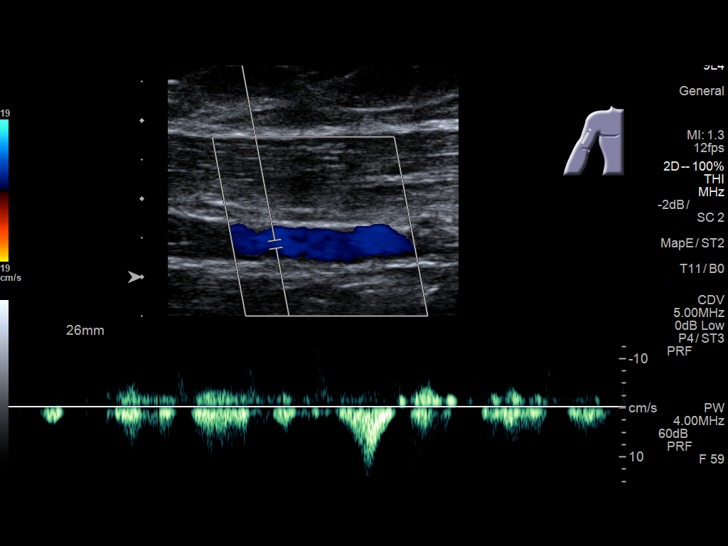
[im 31/34]
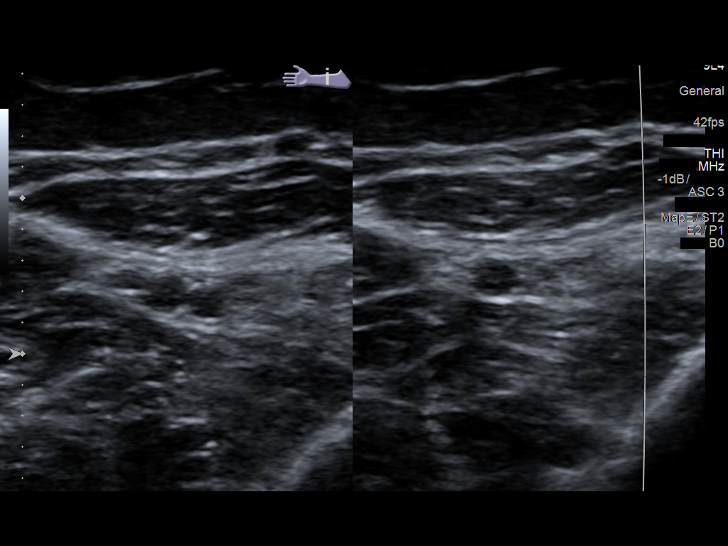
[im 34/34]
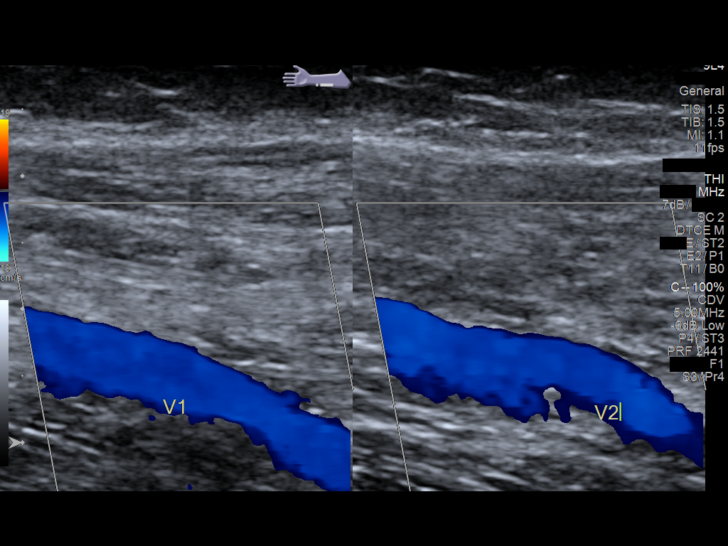

[13 of 24 positions shown; findings below may reference images not displayed]

FINDINGS: Contralateral Subclavian Vein: Respiratory phasicity is normal and
symmetric with the symptomatic side. No evidence of thrombus. Normal
compressibility.

Internal Jugular Vein: No evidence of thrombus. Normal
compressibility, respiratory phasicity and response to augmentation.

Subclavian Vein: No evidence of thrombus. Normal compressibility,
respiratory phasicity and response to augmentation.

Axillary Vein: No evidence of thrombus. Normal compressibility,
respiratory phasicity and response to augmentation.

Cephalic Vein: No evidence of thrombus. Normal compressibility,
respiratory phasicity and response to augmentation.

Basilic Vein: No evidence of thrombus. Normal compressibility,
respiratory phasicity and response to augmentation.

Brachial Veins: No evidence of thrombus. Normal compressibility,
respiratory phasicity and response to augmentation.

Radial Veins: No evidence of thrombus. Normal compressibility,
respiratory phasicity and response to augmentation.

Ulnar Veins: No evidence of thrombus. Normal compressibility,
respiratory phasicity and response to augmentation.

Venous Reflux:  None visualized.

Other Findings:  None visualized.
IMPRESSION: No evidence of DVT within the right upper extremity.

## 2020-01-16 ENCOUNTER — Inpatient Hospital Stay (HOSPITAL_COMMUNITY)
Admission: RE | Admit: 2020-01-16 | Discharge: 2020-01-16 | Disposition: A | Discharge status: Home / Self Care | Payer: Self-pay | Source: Ambulatory Visit

## 2020-01-16 ENCOUNTER — Ambulatory Visit (HOSPITAL_COMMUNITY)
Admission: EM | Admit: 2020-01-16 | Discharge: 2020-01-16 | Disposition: A | Discharge status: Home / Self Care | Payer: BC Managed Care – PPO | Attending: Urgent Care | Admitting: Urgent Care

## 2020-01-16 ENCOUNTER — Ambulatory Visit (HOSPITAL_COMMUNITY)
Admission: EM | Admit: 2020-01-16 | Discharge: 2020-01-16 | Disposition: A | Discharge status: Other Acute Inpatient Hospital | Payer: Self-pay

## 2020-01-16 ENCOUNTER — Other Ambulatory Visit: Payer: Self-pay

## 2020-01-16 ENCOUNTER — Encounter (HOSPITAL_COMMUNITY): Payer: Self-pay

## 2020-01-16 DIAGNOSIS — I10 Essential (primary) hypertension: Secondary | ICD-10-CM

## 2020-01-16 DIAGNOSIS — R1032 Left lower quadrant pain: Secondary | ICD-10-CM

## 2020-01-16 DIAGNOSIS — R319 Hematuria, unspecified: Secondary | ICD-10-CM

## 2020-01-16 DIAGNOSIS — K921 Melena: Secondary | ICD-10-CM

## 2020-01-16 LAB — POCT URINALYSIS DIP (DEVICE)
Bilirubin Urine: NEGATIVE
Glucose, UA: NEGATIVE mg/dL
Ketones, ur: NEGATIVE mg/dL
Leukocytes,Ua: NEGATIVE
Nitrite: NEGATIVE
Protein, ur: NEGATIVE mg/dL
Specific Gravity, Urine: >=1.03 (ref 1.005–1.030)
Urobilinogen, UA: 0.2 mg/dL (ref 0.0–1.0)
pH: 5 (ref 5.0–8.0)

## 2020-01-16 MED ORDER — LOSARTAN POTASSIUM 50 MG PO TABS: 50.0000 mg | ORAL_TABLET | Freq: Every day | ORAL | 0 refills | Status: DC

## 2020-01-16 NOTE — ED Provider Notes (Signed)
[facility]   MRN: [id number] DOB: 02-26-1971  Subjective:   Mckenzie Anderson is a 49 y.o. female presenting for 1 week history of acute onset left lower abdominal pain that radiates across her lower abdomen at times.  She is also had some blood in her stools.  Has longstanding history of intermittent constipation but has been having daily bowel movements.  Patient just finished treatment for H. pylori with amoxicillin, clarithromycin and Prilosec.  Denies having diarrhea.  Patient also admits that she is not taking her blood pressure medications as prescribed.  She is supposed to be on amlodipine, losartan-hydrochlorothiazide.  However, she cannot tolerate hydrochlorothiazide due to muscle cramping.  She has not seen her PCP back yet.  Denies headache, confusion, dizziness, weakness, hematuria, dysuria, urinary frequency.  Denies history of kidney stones.  No current facility-administered medications for this encounter.  Current Outpatient Medications:    ALBUTEROL IN, , Disp: , Rfl:    amLODipine (NORVASC) 10 MG tablet, Take by mouth., Disp: , Rfl:    aspirin-acetaminophen-caffeine (EXCEDRIN MIGRAINE) 250-250-65 MG tablet, Take 1 tablet by mouth every 6 (six) hours as needed for headache., Disp: , Rfl:    Beclomethasone Dipropionate (QVAR IN), , Disp: , Rfl:    chlorpheniramine-HYDROcodone (TUSSIONEX) 10-8 MG/5ML SUER, Take 5 mLs by mouth every 12 (twelve) hours as needed., Disp: , Rfl: 0   losartan-hydrochlorothiazide (HYZAAR) 100-12.5 MG tablet, Take 1 tablet by mouth daily., Disp: , Rfl: 3   methylPREDNISolone (MEDROL DOSEPAK) 4 MG TBPK tablet, follow package directions (Patient not taking: Reported on 04/23/2017), Disp: 21 tablet, Rfl: 1   naproxen sodium (ALEVE) 220 MG tablet, Take 220-880 mg by mouth every 12 (twelve) hours as needed (for headaches)., Disp: , Rfl:    orphenadrine (NORFLEX) 100 MG tablet, Take 1 tablet (100 mg total) by mouth 2 (two) times daily.  (Patient not taking: Reported on 04/23/2017), Disp: 30 tablet, Rfl: 0   phentermine 37.5 MG capsule, Take 37.5 mg by mouth See admin instructions. One-half to one tablet daily as needed, Disp: , Rfl:    prochlorperazine (COMPAZINE) 10 MG tablet, Take 1 tablet (10 mg total) by mouth every 8 (eight) hours as needed for headache (Patient not taking: Reported on 04/23/2017), Disp: 30 tablet, Rfl: 1   propranolol ER (INDERAL LA) 60 MG 24 hr capsule, Take 1 capsule (60 mg total) by mouth daily. (Patient not taking: Reported on 04/23/2017), Disp: 30 capsule, Rfl: 12   Vitamin D, Ergocalciferol, (DRISDOL) 50000 units CAPS capsule, Take by mouth., Disp: , Rfl:    Allergies  Allergen Reactions   Sulfa Antibiotics     Past Medical History:  Diagnosis Date   Asthma    Gross hematuria    Heartburn    Hypertension    Migraines      Past Surgical History:  Procedure Laterality Date   ABDOMINAL HYSTERECTOMY     CARPAL TUNNEL RELEASE     CHOLECYSTECTOMY      Family History  Problem Relation Age of Onset   Kidney cancer Father    Cancer Unknown    Seizures Unknown        Grandmother's sister   Seizures Maternal Aunt    Breast cancer Maternal Grandmother 53   Migraines Neg Hx    Bladder Cancer Neg Hx     Social History   Tobacco Use   Smoking status: Current Some Day Smoker    Types: Cigarettes   Smokeless tobacco: Never Used  Substance Use  Topics   Alcohol use: No   Drug use: No    ROS   Objective:   Vitals: BP (!) 175/108 (BP Location: Right Arm) Comment: pt denies taking antihypertesnsives x1  months r/t cramping   Pulse 98    Temp 99.1 F (37.3 C) (Oral)    Resp 18    SpO2 100%   BP recheck is 191/128.  Physical Exam Constitutional:      General: She is not in acute distress.    Appearance: Normal appearance. She is well-developed and normal weight. She is not ill-appearing, toxic-appearing or diaphoretic.  HENT:     Head: Normocephalic and  atraumatic.     Right Ear: External ear normal.     Left Ear: External ear normal.     Nose: Nose normal.     Mouth/Throat:     Mouth: Mucous membranes are moist.     Pharynx: Oropharynx is clear.  Eyes:     General: No scleral icterus.       Right eye: No discharge.        Left eye: No discharge.     Extraocular Movements: Extraocular movements intact.     Conjunctiva/sclera: Conjunctivae normal.     Pupils: Pupils are equal, round, and reactive to light.  Cardiovascular:     Rate and Rhythm: Normal rate and regular rhythm.     Pulses: Normal pulses.     Heart sounds: Normal heart sounds. No murmur heard.  No friction rub. No gallop.   Pulmonary:     Effort: Pulmonary effort is normal. No respiratory distress.     Breath sounds: Normal breath sounds. No stridor. No wheezing, rhonchi or rales.  Abdominal:     General: Bowel sounds are normal. There is no distension.     Palpations: Abdomen is soft. There is no mass.     Tenderness: There is generalized abdominal tenderness and tenderness in the left lower quadrant. There is no right CVA tenderness, left CVA tenderness, guarding or rebound.  Musculoskeletal:     Lumbar back: Tenderness (lower back as outlined) present. No swelling, edema, deformity, signs of trauma, lacerations, spasms or bony tenderness. Normal range of motion. Negative right straight leg raise test and negative left straight leg raise test. No scoliosis.       Back:     Right lower leg: No edema.     Left lower leg: No edema.  Skin:    General: Skin is warm and dry.     Coloration: Skin is not pale.     Findings: No rash.  Neurological:     General: No focal deficit present.     Mental Status: She is alert and oriented to person, place, and time.     Cranial Nerves: No cranial nerve deficit.     Motor: No weakness.     Coordination: Coordination normal.     Gait: Gait normal.     Deep Tendon Reflexes: Reflexes normal.  Psychiatric:        Mood and  Affect: Mood normal.        Behavior: Behavior normal.        Thought Content: Thought content normal.        Judgment: Judgment normal.    Results for orders placed or performed during the hospital encounter of 01/16/20 (from the past 24 hour(s))  POCT urinalysis dip (device)     Status: Abnormal   Collection Time: 01/16/20  8:32 PM  Result Value Ref  Range   Glucose, UA NEGATIVE NEGATIVE mg/dL   Bilirubin Urine NEGATIVE NEGATIVE   Ketones, ur NEGATIVE NEGATIVE mg/dL   Specific Gravity, Urine >=1.030 1.005 - 1.030   Hgb urine dipstick SMALL (A) NEGATIVE   pH 5.0 5.0 - 8.0   Protein, ur NEGATIVE NEGATIVE mg/dL   Urobilinogen, UA 0.2 0.0 - 1.0 mg/dL   Nitrite NEGATIVE NEGATIVE   Leukocytes,Ua NEGATIVE NEGATIVE    Assessment and Plan :   PDMP not reviewed this encounter.  1. Left lower quadrant abdominal pain   2. Bloody stools   3. Hematuria, unspecified type   4. Essential hypertension     Patient has significant pain of the left lower quadrant, together with blood in her stools I am recommending patient report to the emergency room for CT abdomen pelvis to rule out diverticulitis, other acute intra-abdominal process.  Patient refused pain medications here in the clinic including Tylenol, hydrocodone.  Contracts for safety and will report to the emergency room now.   Wallis Bamberg, PA-C 01/17/20 1024

## 2020-01-16 NOTE — ED Triage Notes (Signed)
Pt presents to UC for abdominal pain for several months. Pt describes abdominal pain as "dull and nagging", and across bilateral lower abdomen. Pt denies n/v/d. Pt states bowels are irregular. Pt endorses occasional blood in stool. Pt states she tested positive for H.pyloiri but did not follow up with PCP after testing. Pt states she has been taking ibuprofen, with out relief.

## 2020-01-16 NOTE — Discharge Instructions (Signed)
I recommend you go to the emergency room for consideration of a CT scan to rule out diverticulitis. You have persistent abdominal pain, bloody stools and a CT scan would help rule out diverticulitis.

## 2020-01-17 ENCOUNTER — Emergency Department (HOSPITAL_COMMUNITY)
Admission: EM | Admit: 2020-01-17 | Discharge: 2020-01-17 | Disposition: A | Discharge status: Home / Self Care | Payer: BC Managed Care – PPO | Attending: Emergency Medicine | Admitting: Emergency Medicine

## 2020-01-17 ENCOUNTER — Emergency Department (HOSPITAL_COMMUNITY): Payer: BC Managed Care – PPO

## 2020-01-17 ENCOUNTER — Encounter (HOSPITAL_COMMUNITY): Payer: Self-pay | Admitting: Emergency Medicine

## 2020-01-17 DIAGNOSIS — I10 Essential (primary) hypertension: Secondary | ICD-10-CM | POA: Insufficient documentation

## 2020-01-17 DIAGNOSIS — R1032 Left lower quadrant pain: Secondary | ICD-10-CM | POA: Diagnosis not present

## 2020-01-17 DIAGNOSIS — Z79899 Other long term (current) drug therapy: Secondary | ICD-10-CM | POA: Diagnosis not present

## 2020-01-17 DIAGNOSIS — F17210 Nicotine dependence, cigarettes, uncomplicated: Secondary | ICD-10-CM | POA: Diagnosis not present

## 2020-01-17 DIAGNOSIS — J45909 Unspecified asthma, uncomplicated: Secondary | ICD-10-CM | POA: Diagnosis not present

## 2020-01-17 DIAGNOSIS — K5900 Constipation, unspecified: Secondary | ICD-10-CM | POA: Insufficient documentation

## 2020-01-17 LAB — I-STAT BETA HCG BLOOD, ED (MC, WL, AP ONLY): I-stat hCG, quantitative: <5 m[IU]/mL (ref <5)

## 2020-01-17 LAB — CBC WITH DIFFERENTIAL/PLATELET
Abs Immature Granulocytes: 0.02 10*3/uL (ref 0.00–0.07)
Basophils Absolute: 0 10*3/uL (ref 0.0–0.1)
Basophils Relative: 1 %
Eosinophils Absolute: 0.1 10*3/uL (ref 0.0–0.5)
Eosinophils Relative: 2 %
HCT: 39.7 % (ref 36.0–46.0)
Hemoglobin: 13.2 g/dL (ref 12.0–15.0)
Immature Granulocytes: 0 %
Lymphocytes Relative: 25 %
Lymphs Abs: 1.7 10*3/uL (ref 0.7–4.0)
MCH: 30.6 pg (ref 26.0–34.0)
MCHC: 33.2 g/dL (ref 30.0–36.0)
MCV: 91.9 fL (ref 80.0–100.0)
Monocytes Absolute: 0.7 10*3/uL (ref 0.1–1.0)
Monocytes Relative: 10 %
Neutro Abs: 4.3 10*3/uL (ref 1.7–7.7)
Neutrophils Relative %: 62 %
Platelets: 277 10*3/uL (ref 150–400)
RBC: 4.32 MIL/uL (ref 3.87–5.11)
RDW: 13.2 % (ref 11.5–15.5)
WBC: 6.8 10*3/uL (ref 4.0–10.5)
nRBC: 0 % (ref 0.0–0.2)

## 2020-01-17 LAB — I-STAT CHEM 8, ED
BUN: 12 mg/dL (ref 6–20)
Calcium, Ion: 1.21 mmol/L (ref 1.15–1.40)
Chloride: 101 mmol/L (ref 98–111)
Creatinine, Ser: 0.6 mg/dL (ref 0.44–1.00)
Glucose, Bld: 118 mg/dL — ABNORMAL HIGH (ref 70–99)
HCT: 38 % (ref 36.0–46.0)
Hemoglobin: 12.9 g/dL (ref 12.0–15.0)
Potassium: 3.2 mmol/L — ABNORMAL LOW (ref 3.5–5.1)
Sodium: 139 mmol/L (ref 135–145)
TCO2: 25 mmol/L (ref 22–32)

## 2020-01-17 MED ORDER — IOHEXOL 300 MG/ML  SOLN
100.0000 mL | Freq: Once | INTRAMUSCULAR | Status: AC | PRN
  Administered 2020-01-17: 100 mL via INTRAVENOUS

## 2020-01-17 MED ORDER — DICLOFENAC SODIUM ER 100 MG PO TB24: 100.0000 mg | ORAL_TABLET | Freq: Every day | ORAL | 0 refills | Status: DC

## 2020-01-17 NOTE — ED Provider Notes (Signed)
[facility] EMERGENCY DEPARTMENT Provider Note   CSN: [id number] Arrival date & time: 01/17/20  3976     History Chief Complaint  Patient presents with  . Abdominal Pain    Mckenzie Anderson is a 49 y.o. female.  The history is provided by the patient.  Abdominal Pain Pain location:  LLQ Pain quality: dull   Pain radiates to:  Does not radiate Pain severity:  Moderate Onset quality:  Gradual Duration:  8 weeks Timing:  Constant Progression:  Unchanged Chronicity:  New Context: not alcohol use, not awakening from sleep, not diet changes, not eating, not laxative use, not medication withdrawal, not previous surgeries, not recent illness, not recent sexual activity, not recent travel, not retching, not sick contacts, not suspicious food intake and not trauma   Relieved by:  Nothing Worsened by:  Nothing Ineffective treatments:  None tried Associated symptoms: constipation   Associated symptoms: no anorexia, no belching, no chest pain, no chills, no cough, no diarrhea, no dysuria, no fatigue, no fever, no flatus, no hematemesis, no melena, no nausea, no vaginal bleeding, no vaginal discharge and no vomiting   Risk factors: no alcohol abuse        Past Medical History:  Diagnosis Date  . Asthma   . Gross hematuria   . Heartburn   . Hypertension   . Migraines     Patient Active Problem List   Diagnosis Date Noted  . Migraine 01/08/2016  . Muscle cramp 01/08/2016  . HTN (hypertension) 01/08/2016    Past Surgical History:  Procedure Laterality Date  . ABDOMINAL HYSTERECTOMY    . CARPAL TUNNEL RELEASE    . CHOLECYSTECTOMY       OB History   No obstetric history on file.     Family History  Problem Relation Age of Onset  . Kidney cancer Father   . Cancer Other   . Seizures Other        Grandmother's sister  . Seizures Maternal Aunt   . Breast cancer Maternal Grandmother 21  . Migraines Neg Hx   . Bladder Cancer Neg Hx     Social  History   Tobacco Use  . Smoking status: Current Some Day Smoker    Types: Cigarettes  . Smokeless tobacco: Never Used  Substance Use Topics  . Alcohol use: No  . Drug use: No    Home Medications Prior to Admission medications   Medication Sig Start Date End Date Taking? Authorizing Provider  ALBUTEROL IN     [provider]  amLODipine (NORVASC) 10 MG tablet Take by mouth.    [provider]  aspirin-acetaminophen-caffeine (EXCEDRIN MIGRAINE) [phone] MG tablet Take 1 tablet by mouth every 6 (six) hours as needed for headache.    [provider]  Beclomethasone Dipropionate (QVAR IN)     [provider]  chlorpheniramine-HYDROcodone (TUSSIONEX) 10-8 MG/5ML SUER Take 5 mLs by mouth every 12 (twelve) hours as needed. 10/28/15   [provider]  losartan (COZAAR) 50 MG tablet Take 1 tablet (50 mg total) by mouth daily. 01/16/20   Wallis Bamberg, PA-C  losartan-hydrochlorothiazide (HYZAAR) 100-12.5 MG tablet Take 1 tablet by mouth daily. Patient not taking: Reported on 01/16/2020 12/07/15   [provider]  methylPREDNISolone (MEDROL DOSEPAK) 4 MG TBPK tablet follow package directions Patient not taking: Reported on 04/23/2017 04/11/16   Anson Fret, MD  naproxen sodium (ALEVE) 220 MG tablet Take 220-880 mg by mouth every 12 (twelve) hours as  needed (for headaches).    [provider]  orphenadrine (NORFLEX) 100 MG tablet Take 1 tablet (100 mg total) by mouth 2 (two) times daily. Patient not taking: Reported on 04/23/2017 02/03/15   Dione Booze, MD  phentermine 37.5 MG capsule Take 37.5 mg by mouth See admin instructions. One-half to one tablet daily as needed    [provider]  prochlorperazine (COMPAZINE) 10 MG tablet Take 1 tablet (10 mg total) by mouth every 8 (eight) hours as needed for headache Patient not taking: Reported on 04/23/2017 04/11/16   Anson Fret, MD  propranolol ER (INDERAL LA) 60 MG 24 hr capsule  Take 1 capsule (60 mg total) by mouth daily. Patient not taking: Reported on 04/23/2017 04/11/16   Anson Fret, MD  Vitamin D, Ergocalciferol, (DRISDOL) 50000 units CAPS capsule Take by mouth.    [provider]    Allergies    Sulfa antibiotics  Review of Systems   Review of Systems  Constitutional: Negative for chills, fatigue and fever.  HENT: Negative for congestion.   Eyes: Negative for visual disturbance.  Respiratory: Negative for cough.   Cardiovascular: Negative for chest pain.  Gastrointestinal: Positive for abdominal pain and constipation. Negative for anorexia, diarrhea, flatus, hematemesis, melena, nausea and vomiting.  Genitourinary: Negative for dysuria, vaginal bleeding and vaginal discharge.  Musculoskeletal: Negative for arthralgias.  Skin: Negative for rash.  Neurological: Negative for dizziness.  Psychiatric/Behavioral: Negative for agitation.  All other systems reviewed and are negative.   Physical Exam Updated Vital Signs BP (!) 157/103   Pulse 72   Temp 98.1 F (36.7 C) (Oral)   Resp 16   Ht 5\' 8"  (1.727 m)   Wt 113.6 kg   SpO2 100%   BMI 38.08 kg/m   Physical Exam  ED Results / Procedures / Treatments   Labs (all labs ordered are listed, but only abnormal results are displayed) Results for orders placed or performed during the hospital encounter of 01/17/20  CBC with Differential/Platelet  Result Value Ref Range   WBC 6.8 4.0 - 10.5 K/uL   RBC 4.32 3.87 - 5.11 MIL/uL   Hemoglobin 13.2 12.0 - 15.0 g/dL   HCT 03/19/20 36 - 46 %   MCV 91.9 80.0 - 100.0 fL   MCH 30.6 26.0 - 34.0 pg   MCHC 33.2 30.0 - 36.0 g/dL   RDW 95.6 21.3 - 08.6 %   Platelets 277 150 - 400 K/uL   nRBC 0.0 0.0 - 0.2 %   Neutrophils Relative % 62 %   Neutro Abs 4.3 1.7 - 7.7 K/uL   Lymphocytes Relative 25 %   Lymphs Abs 1.7 0.7 - 4.0 K/uL   Monocytes Relative 10 %   Monocytes Absolute 0.7 0 - 1 K/uL   Eosinophils Relative 2 %   Eosinophils Absolute 0.1 0 - 0  K/uL   Basophils Relative 1 %   Basophils Absolute 0.0 0 - 0 K/uL   Immature Granulocytes 0 %   Abs Immature Granulocytes 0.02 0.00 - 0.07 K/uL  I-stat chem 8, ED (not at [facility] or [facility])  Result Value Ref Range   Sodium 139 135 - 145 mmol/L   Potassium 3.2 (L) 3.5 - 5.1 mmol/L   Chloride 101 98 - 111 mmol/L   BUN 12 6 - 20 mg/dL   Creatinine, Ser [facility] 0.44 - 1.00 mg/dL   Glucose, Bld 4.69 (H) 70 - 99 mg/dL   Calcium, Ion 629 5.28 - 1.40 mmol/L  TCO2 25 22 - 32 mmol/L   Hemoglobin 12.9 12.0 - 15.0 g/dL   HCT 83.4 36 - 46 %  I-Stat Beta hCG blood, ED ([facility], [facility], [facility] only)  Result Value Ref Range   I-stat hCG, quantitative <5.0 <5 mIU/mL   Comment 3           No results found.  Radiology No results found.  Procedures Procedures (including critical care time)  Medications Ordered in ED Medications  iohexol (OMNIPAQUE) 300 MG/ML solution 100 mL (100 mLs Intravenous Contrast Given 01/17/20 0538)    ED Course  I have reviewed the triage vital signs and the nursing notes.  Pertinent labs & imaging results that were available during my care of the patient were reviewed by me and considered in my medical decision making (see chart for details).    2 months of pain. There are no acute findings on CT scan.  There is no blood in the patient's urine.  I have advised close follow up with your PMD and GI as an outpatient. Strict return precautions.     Mckenzie Anderson was evaluated in Emergency Department on 01/17/2020 for the symptoms described in the history of present illness. She was evaluated in the context of the global COVID-19 pandemic, which necessitated consideration that the patient might be at risk for infection with the SARS-CoV-2 virus that causes COVID-19. Institutional protocols and algorithms that pertain to the evaluation of patients at risk for COVID-19 are in a state of rapid change based on information released by regulatory bodies including the CDC and federal and state  organizations. These policies and algorithms were followed during the patient's care in the ED.  Final Clinical Impression(s) / ED Diagnoses Return for intractable cough, coughing up blood,fevers >100.4 unrelieved by medication, shortness of breath, intractable vomiting, chest pain, shortness of breath, weakness,numbness, changes in speech, facial asymmetry,abdominal pain, passing out,Inability to tolerate liquids or food, cough, altered mental status or any concerns. No signs of systemic illness or infection. The patient is nontoxic-appearing on exam and vital signs are within normal limits.   I have reviewed the triage vital signs and the nursing notes. Pertinent labs &imaging results that were available during my care of the patient were reviewed by me and considered in my medical decision making (see chart for details).After history, exam, and medical workup I feel the patient has beenappropriately medically screened and is safe for discharge home. Pertinent diagnoses were discussed with the patient. Patient was given return precautions.    Jonalyn Sedlak, MD 01/17/20 [phone]

## 2020-01-17 NOTE — ED Triage Notes (Signed)
Sent by [facility] for further eval of abd pain X several months. Sent for CT abd for r/o diverticulitis. Patient has been in lobby since approx 1930 and was somehow never registered as a patient in the ED. Patient now registered and will make priority for room. Apologized to patient for delay.

## 2020-01-30 ENCOUNTER — Encounter: Payer: Self-pay | Admitting: Cardiology

## 2020-01-30 ENCOUNTER — Other Ambulatory Visit: Payer: Self-pay

## 2020-01-30 ENCOUNTER — Ambulatory Visit: Payer: BC Managed Care – PPO | Admitting: Cardiology

## 2020-01-30 VITALS — BP 134/94 | HR 78 | Ht 68.0 in | Wt 248.2 lb

## 2020-01-30 DIAGNOSIS — I10 Essential (primary) hypertension: Secondary | ICD-10-CM

## 2020-01-30 DIAGNOSIS — I340 Nonrheumatic mitral (valve) insufficiency: Secondary | ICD-10-CM | POA: Diagnosis not present

## 2020-01-30 DIAGNOSIS — I351 Nonrheumatic aortic (valve) insufficiency: Secondary | ICD-10-CM

## 2020-01-30 DIAGNOSIS — R400 Somnolence: Secondary | ICD-10-CM | POA: Diagnosis not present

## 2020-01-30 NOTE — Progress Notes (Signed)
Cardiology Office Note:    Date:  01/30/2020   ID:  JOURDAN MALDONADO, DOB 28-Feb-1971, MRN [id number]  PCP:  Armando Gang, FNP  Cardiologist:  No primary care provider on file.  Electrophysiologist:  None   Referring MD: Armando Gang, FNP   Chief Complaint  Patient presents with   Hypertension    History of Present Illness:    Mckenzie Anderson is a 49 y.o. female with a hx of hypertension who is referred by Franco Nones, FNP for evaluation of hypertension and valvular regurgitation.  She denies any chest pain or dyspnea.  Reports that she goes for walks 2-3 times per week for 20 to 30 minutes.  She denies any exertional symptoms.  Denies any lightheadedness, syncope, or palpitations.  Does report occasional lower extremity edema.  She previously was on losartan-HCTZ, but reported she developed muscle aches whenever she took HCTZ.  Last week she was taken off HCTZ and started on spironolactone.  She has a monitor at home but has not been checking her BP.  She smokes about 1 pack/week.  Family history includes daughter had CVA.  Echocardiogram done in [place] on 07/08/2019 showed normal LV size and function, mild LVH, mild AI/MR/TR.  Labs on 10/15/2019 showed creatinine 0.8, hemoglobin 13.8, WBC 7.9, sodium 135, potassium 4.1, albumin 4.4, alkaline phosphatase 186, normal AST/ALT, LDL 106, A1c 5.5.  Past Medical History:  Diagnosis Date   Asthma    Gross hematuria    Heartburn    Hypertension    Migraines     Past Surgical History:  Procedure Laterality Date   ABDOMINAL HYSTERECTOMY     CARPAL TUNNEL RELEASE     CHOLECYSTECTOMY      Current Medications: Current Meds  Medication Sig   amLODipine (NORVASC) 10 MG tablet Take 10 mg by mouth daily.    Cholecalciferol (VITAMIN D3) 1.25 MG (50000 UT) CAPS Take 50,000 Units by mouth once a week.   Clindamycin-Benzoyl Per, Refr, gel Apply 1 application topically daily as needed.   cyanocobalamin (,VITAMIN  B-12,) 1000 MCG/ML injection Inject 1,000 mcg into the muscle every 30 (thirty) days.   desloratadine (CLARINEX) 5 MG tablet Take 5 mg by mouth daily as needed for allergies.   Diclofenac Sodium CR 100 MG 24 hr tablet Take 1 tablet (100 mg total) by mouth daily.   doxycycline (VIBRAMYCIN) 100 MG capsule Take 100 mg by mouth 2 (two) times daily.   fluticasone (FLONASE) 50 MCG/ACT nasal spray Place 1 spray into both nostrils daily as needed for allergies.    ibuprofen (ADVIL) 600 MG tablet Take 600 mg by mouth every 6 (six) hours as needed for fever or moderate pain.   losartan (COZAAR) 100 MG tablet Take 100 mg by mouth daily.   meloxicam (MOBIC) 15 MG tablet Take 15 mg by mouth daily as needed for pain.   metoprolol tartrate (LOPRESSOR) 25 MG tablet Take 25 mg by mouth 2 (two) times daily.   naproxen sodium (ALEVE) 220 MG tablet Take 220-880 mg by mouth every 12 (twelve) hours as needed (for headaches).   orphenadrine (NORFLEX) 100 MG tablet Take 1 tablet (100 mg total) by mouth 2 (two) times daily.   phentermine (ADIPEX-P) 37.5 MG tablet Take 37.5 mg by mouth daily.   POTASSIUM PO Take 1 tablet by mouth daily.   prochlorperazine (COMPAZINE) 10 MG tablet Take 1 tablet (10 mg total) by mouth every 8 (eight) hours as needed for headache   propranolol ER (  INDERAL LA) 60 MG 24 hr capsule Take 1 capsule (60 mg total) by mouth daily.   spironolactone (ALDACTONE) 25 MG tablet Take 25 mg by mouth daily.   [DISCONTINUED] methylPREDNISolone (MEDROL DOSEPAK) 4 MG TBPK tablet follow package directions     Allergies:   Sulfa antibiotics   Social History   Socioeconomic History   Marital status: Divorced    Spouse name: Not on file   Number of children: 3   Years of education: AA   Highest education level: Not on file  Occupational History   Occupation: Emergency planning/management officer tobacco packing  Tobacco Use   Smoking status: Current Some Day Smoker    Types: Cigarettes   Smokeless tobacco:  Never Used  Substance and Sexual Activity   Alcohol use: No   Drug use: No   Sexual activity: Not on file  Other Topics Concern   Not on file  Social History Narrative   Lives with mother and 2 children    Caffeine use: 2-3 Mt Dews occasionally    Social Determinants of Health   Financial Resource Strain:    Difficulty of Paying Living Expenses:   Food Insecurity:    Worried About Programme researcher, broadcasting/film/video in the Last Year:    Barista in the Last Year:   Transportation Needs:    Freight forwarder (Medical):    Lack of Transportation (Non-Medical):   Physical Activity:    Days of Exercise per Week:    Minutes of Exercise per Session:   Stress:    Feeling of Stress :   Social Connections:    Frequency of Communication with Friends and Family:    Frequency of Social Gatherings with Friends and Family:    Attends Religious Services:    Active Member of Clubs or Organizations:    Attends Engineer, structural:    Marital Status:      Family History: The patient's family history includes Breast cancer (age of onset: 9) in her maternal grandmother; Cancer in an other family member; Kidney cancer in her father; Seizures in her maternal aunt and another family member. There is no history of Migraines or Bladder Cancer.  ROS:   Please see the history of present illness.     All other systems reviewed and are negative.  EKGs/Labs/Other Studies Reviewed:    The following studies were reviewed today:  EKG:  EKG is ordered today.  The ekg ordered today demonstrates normal sinus rhythm, PAC, rate 63, no ST/T abnormalities  Recent Labs: 01/17/2020: BUN 12; Creatinine, Ser 0.60; Hemoglobin 12.9; Platelets 277; Potassium 3.2; Sodium 139  Recent Lipid Panel No results found for: CHOL, TRIG, HDL, CHOLHDL, VLDL, LDLCALC, LDLDIRECT  Physical Exam:    VS:  BP (!) 134/94    Pulse 78    Ht 5\' 8"  (1.727 m)    Wt 248 lb 3.2 oz (112.6 kg)    SpO2 97%    BMI  37.74 kg/m     Wt Readings from Last 3 Encounters:  01/30/20 248 lb 3.2 oz (112.6 kg)  01/17/20 250 lb 7.1 oz (113.6 kg)  05/14/17 250 lb 8 oz (113.6 kg)     GEN:   in no acute distress HEENT: Normal NECK: No JVD; No carotid bruits LYMPHATICS: No lymphadenopathy CARDIAC: RRR, no murmurs, rubs, gallops RESPIRATORY:  Clear to auscultation without rales, wheezing or rhonchi  ABDOMEN: Soft, non-tender, non-distended MUSCULOSKELETAL:  No edema; No deformity  SKIN: Warm and dry NEUROLOGIC:  Alert and oriented x 3 PSYCHIATRIC:  Normal affect   ASSESSMENT:    1. Essential hypertension   2. Aortic valve insufficiency, etiology of cardiac valve disease unspecified   3. Mitral valve insufficiency, unspecified etiology   4. Daytime somnolence    PLAN:    Hypertension: On amlodipine 10 mg daily, metoprolol 25 mg BID, losartan 100 mg daily, spironolactone 25 mg daily.  BP mildly elevated in clinic today, but just started on spironolactone.  -Check BMP, magnesium -Concern that untreated OSA may be contributing to hypertension, will check sleep study -Asked patient to check BP twice daily for next 2 weeks and bring log to next appointment.  Will schedule in pharmacy hypertension clinic in 2 weeks  Valvular disease: Mild MR/TR/AI on echo in [place] in 06/2019.  Will repeat echo for monitoring next year  Daytime somnolence: will check sleep study as above  RTC in 3 months  Medication Adjustments/Labs and Tests Ordered: Current medicines are reviewed at length with the patient today.  Concerns regarding medicines are outlined above.  Orders Placed This Encounter  Procedures   Basic metabolic panel   Magnesium   EKG 12-Lead   ECHOCARDIOGRAM COMPLETE   Split night study   No orders of the defined types were placed in this encounter.   Patient Instructions  Medication Instructions:  Your physician recommends that you continue on your current medications as directed. Please  refer to the Current Medication list given to you today.  *If you need a refill on your cardiac medications before your next appointment, please call your pharmacy*   Lab Work: BMET, Mag today  If you have labs (blood work) drawn today and your tests are completely normal, you will receive your results only by:  MyChart Message (if you have MyChart) OR  A paper copy in the mail If you have any lab test that is abnormal or we need to change your treatment, we will call you to review the results.   Testing/Procedures: Your physician has requested that you have an echocardiogram in 1 YEAR. Echocardiography is a painless test that uses sound waves to create images of your heart. It provides your doctor with information about the size and shape of your heart and how well your hearts chambers and valves are working. This procedure takes approximately one hour. There are no restrictions for this procedure.  Your physician has recommended that you have a sleep study. This test records several body functions during sleep, including: brain activity, eye movement, oxygen and carbon dioxide blood levels, heart rate and rhythm, breathing rate and rhythm, the flow of air through your mouth and nose, snoring, body muscle movements, and chest and belly movement. --this must be approved by insurance prior to scheduling  Follow-Up: At [facility], you and your health needs are our priority.  As part of our continuing mission to provide you with exceptional heart care, we have created designated Provider Care Teams.  These Care Teams include your primary Cardiologist (physician) and Advanced Practice Providers (APPs -  Physician Assistants and Nurse Practitioners) who all work together to provide you with the care you need, when you need it.  We recommend signing up for the patient portal called "MyChart".  Sign up information is provided on this After Visit Summary.  MyChart is used to connect with  patients for Virtual Visits (Telemedicine).  Patients are able to view lab/test results, encounter notes, upcoming appointments, etc.  Non-urgent messages can be sent to your provider as  well.   To learn more about what you can do with MyChart, go to [email or web address].    Your next appointment:    2 weeks with pharmacist (BP clinic) 3 months with Dr. Bjorn PippinSchumann  Other Instructions Please check your blood pressure at home 2x daily, write it down.  Bring this log to your next appointment with the pharmacist.  Please bring your home blood pressure cuff as well so we can verify it is reading accurate.     Signed, Little Ishikawahristopher L Tobi Groesbeck, MD  01/30/2020 2:14 PM    [facility]

## 2020-01-30 NOTE — Patient Instructions (Addendum)
Medication Instructions:  Your physician recommends that you continue on your current medications as directed. Please refer to the Current Medication list given to you today.  *If you need a refill on your cardiac medications before your next appointment, please call your pharmacy*   Lab Work: BMET, Mag today  If you have labs (blood work) drawn today and your tests are completely normal, you will receive your results only by: Marland Kitchen MyChart Message (if you have MyChart) OR . A paper copy in the mail If you have any lab test that is abnormal or we need to change your treatment, we will call you to review the results.   Testing/Procedures: Your physician has requested that you have an echocardiogram in 1 YEAR. Echocardiography is a painless test that uses sound waves to create images of your heart. It provides your doctor with information about the size and shape of your heart and how well your heart's chambers and valves are working. This procedure takes approximately one hour. There are no restrictions for this procedure.  Your physician has recommended that you have a sleep study. This test records several body functions during sleep, including: brain activity, eye movement, oxygen and carbon dioxide blood levels, heart rate and rhythm, breathing rate and rhythm, the flow of air through your mouth and nose, snoring, body muscle movements, and chest and belly movement. --this must be approved by insurance prior to scheduling  Follow-Up: At [facility], you and your health needs are our priority.  As part of our continuing mission to provide you with exceptional heart care, we have created designated Provider Care Teams.  These Care Teams include your primary Cardiologist (physician) and Advanced Practice Providers (APPs -  Physician Assistants and Nurse Practitioners) who all work together to provide you with the care you need, when you need it.  We recommend signing up for the patient portal  called "MyChart".  Sign up information is provided on this After Visit Summary.  MyChart is used to connect with patients for Virtual Visits (Telemedicine).  Patients are able to view lab/test results, encounter notes, upcoming appointments, etc.  Non-urgent messages can be sent to your provider as well.   To learn more about what you can do with MyChart, go to [email or web address].    Your next appointment:    2 weeks with pharmacist (BP clinic) 3 months with Dr. Bjorn Pippin  Other Instructions Please check your blood pressure at home 2x daily, write it down.  Bring this log to your next appointment with the pharmacist.  Please bring your home blood pressure cuff as well so we can verify it is reading accurate.

## 2020-01-31 LAB — BASIC METABOLIC PANEL
BUN/Creatinine Ratio: 15 (ref 9–23)
BUN: 12 mg/dL (ref 6–24)
CO2: 20 mmol/L (ref 20–29)
Calcium: 9.7 mg/dL (ref 8.7–10.2)
Chloride: 103 mmol/L (ref 96–106)
Creatinine, Ser: 0.82 mg/dL (ref 0.57–1.00)
GFR calc Af Amer: 97 mL/min/{1.73_m2} (ref >59)
GFR calc non Af Amer: 84 mL/min/{1.73_m2} (ref >59)
Glucose: 106 mg/dL — ABNORMAL HIGH (ref 65–99)
Potassium: 4.8 mmol/L (ref 3.5–5.2)
Sodium: 139 mmol/L (ref 134–144)

## 2020-01-31 LAB — MAGNESIUM: Magnesium: 2.3 mg/dL (ref 1.6–2.3)

## 2020-02-11 ENCOUNTER — Telehealth: Payer: Self-pay | Admitting: *Deleted

## 2020-02-11 ENCOUNTER — Telehealth: Payer: Self-pay | Admitting: Cardiology

## 2020-02-11 NOTE — Telephone Encounter (Signed)
aleep PA request sent to [facility] to complete.

## 2020-02-11 NOTE — Telephone Encounter (Signed)
Left sleep study appointment details on VM. [facility] sleep lab's contact information left if she needs to reschedule.

## 2020-02-11 NOTE — Telephone Encounter (Signed)
Per [employer] a pre-cert is not required for a split night sleep study

## 2020-02-13 ENCOUNTER — Other Ambulatory Visit: Payer: Self-pay

## 2020-02-13 ENCOUNTER — Ambulatory Visit (INDEPENDENT_AMBULATORY_CARE_PROVIDER_SITE_OTHER): Payer: BC Managed Care – PPO | Admitting: Pharmacist

## 2020-02-13 VITALS — BP 128/80 | HR 75

## 2020-02-13 DIAGNOSIS — I10 Essential (primary) hypertension: Secondary | ICD-10-CM

## 2020-02-13 NOTE — Progress Notes (Signed)
Patient ID: Mckenzie Anderson                 DOB: February 17, 1971                      MRN: [id number]     HPI: Mckenzie Anderson is a 49 y.o. female referred by Dr. Bjorn Pippin to HTN clinic. PMH is significant for HTN and valvular regurgitation. At last visit with Dr. Bjorn Pippin no changes were made as patient was just started on spironolactone. She is being set up for a sleep study due to concerns of OSA. Scr on 7/16 was 0.82, K 4.8.  Patient presents to clinic today for medication management. She denies dizziness, lightheadedness, headache or blurred vision. When asked if she has SOB, she says she's just overweight. States she has swelling in her knee and ankle. No swelling in ankles seen on exam. Patient med list cleaned up. She does take IBU or other NSAIDs. States she hasn't taken in 2-3 weeks. Complains on leg pain. Doesn't know why they hurt. States it is better off of HCTZ, but still experiencing some leg pain. Wants to know if she really needs the metoprolol. Says this is the last cigarette box, then she is going to quite. Only smokes one pack per week.    Current HTN meds: amlodipine 10 mg daily, metoprolol 25 mg BID, losartan 100 mg daily, spironolactone 25 mg  Previously tried: HCTZ (muscle aches) BP goal: <130/80  Family History: The patient's family history includes Breast cancer (age of onset: 63) in her maternal grandmother; Cancer in an other family member; Kidney cancer in her father; Seizures in her maternal aunt and another family member. There is no history of Migraines or Bladder Cancer.  Social History: smokes 1ppw  Diet: low sodium salt, garlic salt  Exercise: walks 2-3 times a week- 20-30 min   Home BP readings: 138/73, 137/70, 142/75, 136/74, 122/71, 124/74, 129/73, 130/62, 154/84, 166/92, 149/79, 176/111, 126/83  Wt Readings from Last 3 Encounters:  01/30/20 248 lb 3.2 oz (112.6 kg)  01/17/20 250 lb 7.1 oz (113.6 kg)  05/14/17 250 lb 8 oz (113.6 kg)   BP Readings from  Last 3 Encounters:  01/30/20 (!) 134/94  01/17/20 (!) 159/88  01/16/20 (!) 175/108   Pulse Readings from Last 3 Encounters:  01/30/20 78  01/17/20 77  01/16/20 98    Renal function: CrCl cannot be calculated (Unknown ideal weight.).  Past Medical History:  Diagnosis Date  . Asthma   . Gross hematuria   . Heartburn   . Hypertension   . Migraines     Current Outpatient Medications on File Prior to Visit  Medication Sig Dispense Refill  . amLODipine (NORVASC) 10 MG tablet Take 10 mg by mouth daily.     . Cholecalciferol (VITAMIN D3) 1.25 MG (50000 UT) CAPS Take 50,000 Units by mouth once a week.    . Clindamycin-Benzoyl Per, Refr, gel Apply 1 application topically daily as needed.    . cyanocobalamin (,VITAMIN B-12,) 1000 MCG/ML injection Inject 1,000 mcg into the muscle every 30 (thirty) days.    Marland Kitchen desloratadine (CLARINEX) 5 MG tablet Take 5 mg by mouth daily as needed for allergies.    . Diclofenac Sodium CR 100 MG 24 hr tablet Take 1 tablet (100 mg total) by mouth daily. 10 tablet 0  . doxycycline (VIBRAMYCIN) 100 MG capsule Take 100 mg by mouth 2 (two) times daily.    . fluticasone (FLONASE)  50 MCG/ACT nasal spray Place 1 spray into both nostrils daily as needed for allergies.     Marland Kitchen ibuprofen (ADVIL) 600 MG tablet Take 600 mg by mouth every 6 (six) hours as needed for fever or moderate pain.    Marland Kitchen losartan (COZAAR) 100 MG tablet Take 100 mg by mouth daily.    . meloxicam (MOBIC) 15 MG tablet Take 15 mg by mouth daily as needed for pain.    . metoprolol tartrate (LOPRESSOR) 25 MG tablet Take 25 mg by mouth 2 (two) times daily.    . naproxen sodium (ALEVE) 220 MG tablet Take 220-880 mg by mouth every 12 (twelve) hours as needed (for headaches).    . orphenadrine (NORFLEX) 100 MG tablet Take 1 tablet (100 mg total) by mouth 2 (two) times daily. 30 tablet 0  . phentermine (ADIPEX-P) 37.5 MG tablet Take 37.5 mg by mouth daily.    Marland Kitchen POTASSIUM PO Take 1 tablet by mouth daily.    .  prochlorperazine (COMPAZINE) 10 MG tablet Take 1 tablet (10 mg total) by mouth every 8 (eight) hours as needed for headache 30 tablet 1  . propranolol ER (INDERAL LA) 60 MG 24 hr capsule Take 1 capsule (60 mg total) by mouth daily. 30 capsule 12  . spironolactone (ALDACTONE) 25 MG tablet Take 25 mg by mouth daily.     No current facility-administered medications on file prior to visit.    Allergies  Allergen Reactions  . Sulfa Antibiotics     There were no vitals taken for this visit.   Assessment/Plan:  1. Hypertension - Blood pressure was very close to goal of <130/80 in clinic today. First reading at goal. Repeat readings in both arms, very close. Her home readings varied a lot. She has been checking her BP on the side of her bed. Proper technique reviewed with patient. Will not make any medication changes today. I have encouraged patient to increase her walking/exercise. I would like to see more home readings using proper technique. Patient will bring her monitor in to next visit so we can compare to clinic cuff. She is having a sleep study in August to rule out OSA. Advised to limit NSAID use.  We also discussed potential changes in the future 1. Change losartan to a more potent ARB 2. Increase spironolactone 3. Change metoprolol to carvedilol  If we can get blood pressure under good control, may be able to stop metoprolol as she currently has no compelling indication for a BB and it probably has very little effect on her blood pressure Follow up in 2 weeks  Thank you  Olene Floss, Pharm.D, BCPS, CPP [facility]  [address] [place], [place] [address]  Phone: [phone]; Fax: [phone]

## 2020-02-13 NOTE — Patient Instructions (Addendum)
It was a pleasure to meet you!  Please continue  amlodipine 10 mg daily, metoprolol 25 mg twice a day, losartan 100 mg daily, spironolactone 25 mg daily  Continue checking your blood pressure at home. Please bring your monitor with you to your next appointment.  Before checking your blood pressure make sure: You are seated and quite for 5 min before checking Feet are flat on the floor Siting in chair with your back supported straight up and down Arm resting on table or arm of chair at heart level Bladder is empty You have NOT had caffeine or tobacco within the last 30 min  Check your blood pressure 2-3 times about 1-2 min apart. Usually the first reading will be the highest. Record these readings.  Only check your blood pressure once a day, unless otherwise directed Record your blood pressure readings and bring them to all your appointments. If your meter stores your readings in its memory, then you may bring your blood pressure meter with you to your appointments.  You can find a list of validated (accurate) blood pressure cuffs at WirelessNovelties.no  Lifestyle changes can make a world of difference and are even more important than medications: Try to keep your sodium intake to 2300 mg of sodium per day Get 6-8 uninterrupted hours of sleep per night Aim for a goal of 150 min of moderate aerobic exercise (ie brisk walking, bike riding) per week

## 2020-02-19 ENCOUNTER — Other Ambulatory Visit: Payer: Self-pay

## 2020-02-27 ENCOUNTER — Ambulatory Visit: Payer: BC Managed Care – PPO

## 2020-03-11 ENCOUNTER — Ambulatory Visit (HOSPITAL_BASED_OUTPATIENT_CLINIC_OR_DEPARTMENT_OTHER): Payer: BC Managed Care – PPO | Admitting: Cardiovascular Disease

## 2020-05-04 ENCOUNTER — Ambulatory Visit: Payer: BC Managed Care – PPO | Admitting: Cardiology

## 2020-07-16 ENCOUNTER — Ambulatory Visit
Admission: EM | Admit: 2020-07-16 | Discharge: 2020-07-16 | Disposition: A | Discharge status: Home / Self Care | Payer: BC Managed Care – PPO | Attending: Family Medicine | Admitting: Family Medicine

## 2020-07-16 ENCOUNTER — Encounter: Payer: Self-pay | Admitting: Emergency Medicine

## 2020-07-16 DIAGNOSIS — Z1152 Encounter for screening for COVID-19: Secondary | ICD-10-CM | POA: Diagnosis not present

## 2020-07-16 LAB — POCT RAPID STREP A (OFFICE): Rapid Strep A Screen: NEGATIVE

## 2020-07-16 NOTE — Discharge Instructions (Signed)
Nothing concerning on exam.  Your Covid and flu swab are pending.  He can continue over-the-counter medicines as needed. Follow up as needed for continued or worsening symptoms

## 2020-07-16 NOTE — ED Provider Notes (Signed)
[facility]    CSN: [id number] Arrival date & time: 07/16/20  1232      History   Chief Complaint Chief Complaint  Patient presents with  . Sore Throat  . Nasal Congestion    HPI Mckenzie Anderson is a 49 y.o. female.   Patient is a 49 year old female presents today for sore throat, postnasal drip, mild cough, nasal congestion.  Noticed a white spot on the back of her throat.  No fevers, chills, chest pain or shortness of breath.  Was possibly exposed to Covid at the nursing home where her aunt lives.     Past Medical History:  Diagnosis Date  . Asthma   . Gross hematuria   . Heartburn   . Hypertension   . Migraines     Patient Active Problem List   Diagnosis Date Noted  . Migraine 01/08/2016  . Muscle cramp 01/08/2016  . HTN (hypertension) 01/08/2016    Past Surgical History:  Procedure Laterality Date  . ABDOMINAL HYSTERECTOMY    . CARPAL TUNNEL RELEASE    . CHOLECYSTECTOMY      OB History   No obstetric history on file.      Home Medications    Prior to Admission medications   Medication Sig Start Date End Date Taking? Authorizing Provider  amLODipine (NORVASC) 10 MG tablet Take 10 mg by mouth daily.     [provider]  Cholecalciferol (VITAMIN D3) 1.25 MG (50000 UT) CAPS Take 50,000 Units by mouth once a week. 11/28/19   [provider]  cyanocobalamin (,VITAMIN B-12,) 1000 MCG/ML injection Inject 1,000 mcg into the muscle every 30 (thirty) days. 09/30/19   [provider]  desloratadine (CLARINEX) 5 MG tablet Take 5 mg by mouth daily as needed for allergies. 10/07/19   [provider]  fluticasone (FLONASE) 50 MCG/ACT nasal spray Place 1 spray into both nostrils daily as needed for allergies.  10/07/19   [provider]  ibuprofen (ADVIL) 600 MG tablet Take 600 mg by mouth every 6 (six) hours as needed for fever or moderate pain.    [provider]  losartan (COZAAR) 100 MG tablet Take  100 mg by mouth daily. 01/23/20   [provider]  metoprolol tartrate (LOPRESSOR) 25 MG tablet Take 25 mg by mouth 2 (two) times daily. 07/31/19   [provider]  spironolactone (ALDACTONE) 25 MG tablet Take 25 mg by mouth daily. 01/23/20   [provider]    Family History Family History  Problem Relation Age of Onset  . Kidney cancer Father   . Cancer Other   . Seizures Other        Grandmother's sister  . Seizures Maternal Aunt   . Breast cancer Maternal Grandmother 32  . Migraines Neg Hx   . Bladder Cancer Neg Hx     Social History Social History   Tobacco Use  . Smoking status: Current Some Day Smoker    Types: Cigarettes  . Smokeless tobacco: Never Used  Substance Use Topics  . Alcohol use: No  . Drug use: No     Allergies   Sulfa antibiotics   Review of Systems Review of Systems   Physical Exam Triage Vital Signs ED Triage Vitals  Enc Vitals Group     BP 07/16/20 1316 (!) 180/126     Pulse Rate 07/16/20 1316 88     Resp 07/16/20 1316 16     Temp 07/16/20 1316 98.7 F (37.1 C)  Temp Source 07/16/20 1316 Oral     SpO2 07/16/20 1316 98 %     Weight --      Height 07/16/20 1322 5\' 8"  (1.727 m)     Head Circumference --      Peak Flow --      Pain Score 07/16/20 1321 6     Pain Loc --      Pain Edu? --      Excl. in GC? --    No data found.  Updated Vital Signs BP (!) 180/126 (BP Location: Right Arm)   Pulse 88   Temp 98.7 F (37.1 C) (Oral)   Resp 16   Ht 5\' 8"  (1.727 m)   SpO2 98%   BMI 37.74 kg/m   Visual Acuity Right Eye Distance:   Left Eye Distance:   Bilateral Distance:    Right Eye Near:   Left Eye Near:    Bilateral Near:     Physical Exam Vitals and nursing note reviewed.  Constitutional:      General: She is not in acute distress.    Appearance: Normal appearance. She is not ill-appearing, toxic-appearing or diaphoretic.  HENT:     Head: Normocephalic.     Right Ear: Tympanic membrane and  ear canal normal.     Left Ear: Tympanic membrane and ear canal normal.     Nose: Congestion present.     Mouth/Throat:     Pharynx: Oropharynx is clear.  Eyes:     Conjunctiva/sclera: Conjunctivae normal.  Cardiovascular:     Rate and Rhythm: Normal rate and regular rhythm.  Pulmonary:     Effort: Pulmonary effort is normal.     Breath sounds: Normal breath sounds.  Musculoskeletal:        General: Normal range of motion.     Cervical back: Normal range of motion.  Skin:    General: Skin is warm and dry.     Findings: No rash.  Neurological:     Mental Status: She is alert.  Psychiatric:        Mood and Affect: Mood normal.      UC Treatments / Results  Labs (all labs ordered are listed, but only abnormal results are displayed) Labs Reviewed  NOVEL CORONAVIRUS, NAA  POCT RAPID STREP A (OFFICE)    EKG   Radiology No results found.  Procedures Procedures (including critical care time)  Medications Ordered in UC Medications - No data to display  Initial Impression / Assessment and Plan / UC Course  I have reviewed the triage vital signs and the nursing notes.  Pertinent labs & imaging results that were available during my care of the patient were reviewed by me and considered in my medical decision making (see chart for details).     Viral illness Nothing concerning on exam. Covid and flu test pending. Continue over-the-counter and symptomatic treatment as needed. Follow up as needed for continued or worsening symptoms  Final Clinical Impressions(s) / UC Diagnoses   Final diagnoses:  Encounter for screening for COVID-19     Discharge Instructions     Nothing concerning on exam.  Your Covid and flu swab are pending.  He can continue over-the-counter medicines as needed. Follow up as needed for continued or worsening symptoms     ED Prescriptions    None     PDMP not reviewed this encounter.   07/18/20, NP 07/16/20 1423

## 2020-07-16 NOTE — ED Triage Notes (Signed)
Pt has been having sore throat and nasal drainage down the back of her throat. Pt said she had a white spot on the back of her throat. No fevers, no chills.

## 2020-07-20 LAB — NOVEL CORONAVIRUS, NAA: SARS-CoV-2, NAA: NOT DETECTED

## 2020-07-26 DIAGNOSIS — Z1152 Encounter for screening for COVID-19: Secondary | ICD-10-CM | POA: Diagnosis not present

## 2020-07-26 DIAGNOSIS — Z118 Encounter for screening for other infectious and parasitic diseases: Secondary | ICD-10-CM | POA: Diagnosis not present

## 2020-07-26 DIAGNOSIS — Z112 Encounter for screening for other bacterial diseases: Secondary | ICD-10-CM | POA: Diagnosis not present

## 2020-07-30 DIAGNOSIS — E119 Type 2 diabetes mellitus without complications: Secondary | ICD-10-CM | POA: Diagnosis not present

## 2020-07-30 DIAGNOSIS — E118 Type 2 diabetes mellitus with unspecified complications: Secondary | ICD-10-CM | POA: Diagnosis not present

## 2020-07-30 DIAGNOSIS — E538 Deficiency of other specified B group vitamins: Secondary | ICD-10-CM | POA: Diagnosis not present

## 2020-07-30 DIAGNOSIS — Z79899 Other long term (current) drug therapy: Secondary | ICD-10-CM | POA: Diagnosis not present

## 2020-07-30 DIAGNOSIS — E559 Vitamin D deficiency, unspecified: Secondary | ICD-10-CM | POA: Diagnosis not present

## 2020-07-30 DIAGNOSIS — E7800 Pure hypercholesterolemia, unspecified: Secondary | ICD-10-CM | POA: Diagnosis not present

## 2020-07-30 DIAGNOSIS — Z6836 Body mass index (BMI) 36.0-36.9, adult: Secondary | ICD-10-CM | POA: Diagnosis not present

## 2020-07-30 DIAGNOSIS — I10 Essential (primary) hypertension: Secondary | ICD-10-CM | POA: Diagnosis not present

## 2020-07-30 DIAGNOSIS — R5383 Other fatigue: Secondary | ICD-10-CM | POA: Diagnosis not present

## 2020-07-30 DIAGNOSIS — R002 Palpitations: Secondary | ICD-10-CM | POA: Diagnosis not present

## 2020-07-30 DIAGNOSIS — R7303 Prediabetes: Secondary | ICD-10-CM | POA: Diagnosis not present

## 2020-07-30 DIAGNOSIS — I119 Hypertensive heart disease without heart failure: Secondary | ICD-10-CM | POA: Diagnosis not present

## 2020-08-20 DIAGNOSIS — M19012 Primary osteoarthritis, left shoulder: Secondary | ICD-10-CM | POA: Diagnosis not present

## 2020-08-20 DIAGNOSIS — M160 Bilateral primary osteoarthritis of hip: Secondary | ICD-10-CM | POA: Diagnosis not present

## 2020-08-20 DIAGNOSIS — M1711 Unilateral primary osteoarthritis, right knee: Secondary | ICD-10-CM | POA: Diagnosis not present

## 2020-09-30 DIAGNOSIS — Z79899 Other long term (current) drug therapy: Secondary | ICD-10-CM | POA: Diagnosis not present

## 2020-09-30 DIAGNOSIS — H04203 Unspecified epiphora, bilateral lacrimal glands: Secondary | ICD-10-CM | POA: Diagnosis not present

## 2020-09-30 DIAGNOSIS — R067 Sneezing: Secondary | ICD-10-CM | POA: Diagnosis not present

## 2020-09-30 DIAGNOSIS — J019 Acute sinusitis, unspecified: Secondary | ICD-10-CM | POA: Diagnosis not present

## 2020-09-30 DIAGNOSIS — R519 Headache, unspecified: Secondary | ICD-10-CM | POA: Diagnosis not present

## 2020-10-22 DIAGNOSIS — I10 Essential (primary) hypertension: Secondary | ICD-10-CM | POA: Diagnosis not present

## 2020-10-22 DIAGNOSIS — Z01419 Encounter for gynecological examination (general) (routine) without abnormal findings: Secondary | ICD-10-CM | POA: Diagnosis not present

## 2020-10-22 DIAGNOSIS — Z113 Encounter for screening for infections with a predominantly sexual mode of transmission: Secondary | ICD-10-CM | POA: Diagnosis not present

## 2020-10-22 DIAGNOSIS — R87610 Atypical squamous cells of undetermined significance on cytologic smear of cervix (ASC-US): Secondary | ICD-10-CM | POA: Diagnosis not present

## 2020-10-22 DIAGNOSIS — Z0000 Encounter for general adult medical examination without abnormal findings: Secondary | ICD-10-CM | POA: Diagnosis not present

## 2020-10-22 DIAGNOSIS — Z124 Encounter for screening for malignant neoplasm of cervix: Secondary | ICD-10-CM | POA: Diagnosis not present

## 2020-10-22 DIAGNOSIS — N760 Acute vaginitis: Secondary | ICD-10-CM | POA: Diagnosis not present

## 2020-10-22 DIAGNOSIS — E669 Obesity, unspecified: Secondary | ICD-10-CM | POA: Diagnosis not present

## 2020-11-25 DIAGNOSIS — E559 Vitamin D deficiency, unspecified: Secondary | ICD-10-CM | POA: Diagnosis not present

## 2020-11-25 DIAGNOSIS — D519 Vitamin B12 deficiency anemia, unspecified: Secondary | ICD-10-CM | POA: Diagnosis not present

## 2020-11-25 DIAGNOSIS — Z79899 Other long term (current) drug therapy: Secondary | ICD-10-CM | POA: Diagnosis not present

## 2020-11-25 DIAGNOSIS — I119 Hypertensive heart disease without heart failure: Secondary | ICD-10-CM | POA: Diagnosis not present

## 2020-11-25 DIAGNOSIS — R5383 Other fatigue: Secondary | ICD-10-CM | POA: Diagnosis not present

## 2020-11-25 DIAGNOSIS — R1010 Upper abdominal pain, unspecified: Secondary | ICD-10-CM | POA: Diagnosis not present

## 2020-11-25 DIAGNOSIS — R4589 Other symptoms and signs involving emotional state: Secondary | ICD-10-CM | POA: Diagnosis not present

## 2020-11-25 DIAGNOSIS — R7303 Prediabetes: Secondary | ICD-10-CM | POA: Diagnosis not present

## 2020-11-25 DIAGNOSIS — E039 Hypothyroidism, unspecified: Secondary | ICD-10-CM | POA: Diagnosis not present

## 2020-11-25 DIAGNOSIS — I10 Essential (primary) hypertension: Secondary | ICD-10-CM | POA: Diagnosis not present

## 2020-11-25 DIAGNOSIS — R319 Hematuria, unspecified: Secondary | ICD-10-CM | POA: Diagnosis not present

## 2020-11-25 DIAGNOSIS — R7309 Other abnormal glucose: Secondary | ICD-10-CM | POA: Diagnosis not present

## 2020-11-25 DIAGNOSIS — R197 Diarrhea, unspecified: Secondary | ICD-10-CM | POA: Diagnosis not present

## 2020-11-29 ENCOUNTER — Other Ambulatory Visit: Payer: Self-pay | Admitting: Internal Medicine

## 2020-11-29 ENCOUNTER — Other Ambulatory Visit: Payer: Self-pay | Admitting: Adult Health

## 2020-11-29 ENCOUNTER — Other Ambulatory Visit (HOSPITAL_COMMUNITY): Payer: Self-pay | Admitting: Adult Health

## 2020-11-29 DIAGNOSIS — Z1231 Encounter for screening mammogram for malignant neoplasm of breast: Secondary | ICD-10-CM

## 2020-12-14 ENCOUNTER — Other Ambulatory Visit: Payer: Self-pay

## 2020-12-14 ENCOUNTER — Ambulatory Visit
Admission: RE | Admit: 2020-12-14 | Discharge: 2020-12-14 | Disposition: A | Discharge status: Home / Self Care | Payer: BC Managed Care – PPO | Source: Ambulatory Visit | Attending: Adult Health | Admitting: Adult Health

## 2020-12-14 DIAGNOSIS — Z1231 Encounter for screening mammogram for malignant neoplasm of breast: Secondary | ICD-10-CM | POA: Diagnosis not present

## 2020-12-15 DIAGNOSIS — Z1211 Encounter for screening for malignant neoplasm of colon: Secondary | ICD-10-CM | POA: Diagnosis not present

## 2020-12-15 DIAGNOSIS — Z1212 Encounter for screening for malignant neoplasm of rectum: Secondary | ICD-10-CM | POA: Diagnosis not present

## 2021-01-20 ENCOUNTER — Encounter (HOSPITAL_COMMUNITY): Payer: Self-pay | Admitting: Cardiology

## 2021-01-20 ENCOUNTER — Encounter (HOSPITAL_COMMUNITY): Payer: Self-pay

## 2021-01-20 ENCOUNTER — Other Ambulatory Visit (HOSPITAL_COMMUNITY): Payer: BC Managed Care – PPO

## 2021-01-20 NOTE — Progress Notes (Signed)
Verified appointment "no show" status with A. Martinez at 10:30.

## 2021-01-25 ENCOUNTER — Telehealth (HOSPITAL_COMMUNITY): Payer: Self-pay | Admitting: Cardiology

## 2021-01-25 NOTE — Telephone Encounter (Signed)
Just an FYI. We have made several attempts to contact this patient including sending a letter to schedule or reschedule their echocardiogram. We will be removing the patient from the echo WQ.   01/20/21 NO SHOWED-MAILED LETTER [facility]     Thank you

## 2021-02-22 ENCOUNTER — Ambulatory Visit
Admission: EM | Admit: 2021-02-22 | Discharge: 2021-02-22 | Disposition: A | Discharge status: Home / Self Care | Payer: BC Managed Care – PPO | Attending: Family Medicine | Admitting: Family Medicine

## 2021-02-22 ENCOUNTER — Encounter: Payer: Self-pay | Admitting: Emergency Medicine

## 2021-02-22 DIAGNOSIS — M130 Polyarthritis, unspecified: Secondary | ICD-10-CM | POA: Diagnosis not present

## 2021-02-22 MED ORDER — PREDNISONE 20 MG PO TABS: ORAL_TABLET | ORAL | 0 refills | Status: DC

## 2021-02-22 MED ORDER — METHOCARBAMOL 500 MG PO TABS: 500.0000 mg | ORAL_TABLET | Freq: Four times a day (QID) | ORAL | 0 refills | Status: DC

## 2021-02-22 NOTE — Discharge Instructions (Addendum)
Take Prednisone 20 mg,  in mornings with breakfast as follows:  Take 3 pills for 3 days, Take 2 pills for 3 days, and Take 1 pill for 3 days.   Complete all medication.

## 2021-02-22 NOTE — ED Provider Notes (Signed)
[facility]    CSN: [id number] Arrival date & time: 02/22/21  1504      History   Chief Complaint Chief Complaint  Patient presents with   Spasms    HPI Mckenzie Anderson is a 50 y.o. female.   HPI Patient presents with neck pain, left shoulder, back, and left hip pain x 2 weeks. No injury. She has been seen at her PCP office and had imaging completed which was consisted with osteoarthritis involving the hip.  She has been taking otc medication without relief.  Past Medical History:  Diagnosis Date   Asthma    Gross hematuria    Heartburn    Hypertension    Migraines     Patient Active Problem List   Diagnosis Date Noted   Migraine 01/08/2016   Muscle cramp 01/08/2016   HTN (hypertension) 01/08/2016    Past Surgical History:  Procedure Laterality Date   ABDOMINAL HYSTERECTOMY     CARPAL TUNNEL RELEASE     CHOLECYSTECTOMY      OB History   No obstetric history on file.      Home Medications    Prior to Admission medications   Medication Sig Start Date End Date Taking? Authorizing Provider  methocarbamol (ROBAXIN) 500 MG tablet Take 1 tablet (500 mg total) by mouth 4 (four) times daily. 02/22/21  Yes Bing Neighbors, FNP  predniSONE (DELTASONE) 20 MG tablet Take 3 PO QAM x3days, 2 PO QAM x3days, 1 PO QAM x3days 02/22/21  Yes Bing Neighbors, FNP  amLODipine (NORVASC) 10 MG tablet Take 10 mg by mouth daily.     [provider]  Cholecalciferol (VITAMIN D3) 1.25 MG (50000 UT) CAPS Take 50,000 Units by mouth once a week. 11/28/19   [provider]  cyanocobalamin (,VITAMIN B-12,) 1000 MCG/ML injection Inject 1,000 mcg into the muscle every 30 (thirty) days. 09/30/19   [provider]  desloratadine (CLARINEX) 5 MG tablet Take 5 mg by mouth daily as needed for allergies. 10/07/19   [provider]  fluticasone (FLONASE) 50 MCG/ACT nasal spray Place 1 spray into both nostrils daily as needed for allergies.  10/07/19    [provider]  ibuprofen (ADVIL) 600 MG tablet Take 600 mg by mouth every 6 (six) hours as needed for fever or moderate pain.    [provider]  losartan (COZAAR) 100 MG tablet Take 100 mg by mouth daily. 01/23/20   [provider]  metoprolol tartrate (LOPRESSOR) 25 MG tablet Take 25 mg by mouth 2 (two) times daily. 07/31/19   [provider]  spironolactone (ALDACTONE) 25 MG tablet Take 25 mg by mouth daily. 01/23/20   [provider]    Family History Family History  Problem Relation Age of Onset   Kidney cancer Father    Cancer Other    Seizures Other        Grandmother's sister   Seizures Maternal Aunt    Breast cancer Maternal Grandmother 16   Migraines Neg Hx    Bladder Cancer Neg Hx     Social History Social History   Tobacco Use   Smoking status: Some Days    Types: Cigarettes   Smokeless tobacco: Never  Substance Use Topics   Alcohol use: No   Drug use: No     Allergies   Sulfa antibiotics   Review of Systems Review of Systems Pertinent negatives listed in HPI   Physical Exam Triage Vital Signs ED Triage Vitals  Enc Vitals Group     BP 02/22/21 1743 (!) 178/104     Pulse Rate 02/22/21 1743 74     Resp 02/22/21 1743 17     Temp 02/22/21 1743 98.5 F (36.9 C)     Temp Source 02/22/21 1743 Oral     SpO2 02/22/21 1743 98 %     Weight --      Height --      Head Circumference --      Peak Flow --      Pain Score 02/22/21 1744 7     Pain Loc --      Pain Edu? --      Excl. in GC? --    No data found.  Updated Vital Signs BP (!) 178/104 (BP Location: Right Arm)   Pulse 74   Temp 98.5 F (36.9 C) (Oral)   Resp 17   SpO2 98%   Visual Acuity Right Eye Distance:   Left Eye Distance:   Bilateral Distance:    Right Eye Near:   Left Eye Near:    Bilateral Near:     Physical Exam Constitutional:      Appearance: Normal appearance.  Cardiovascular:     Rate and Rhythm: Normal rate and regular  rhythm.  Pulmonary:     Effort: Pulmonary effort is normal.     Breath sounds: Normal breath sounds and air entry.  Musculoskeletal:     Cervical back: Normal range of motion. Bony tenderness present. Pain with movement present.     Thoracic back: Bony tenderness present.     Lumbar back: Bony tenderness present. No deformity. Decreased range of motion. Negative left straight leg raise test.  Neurological:     Mental Status: She is alert.     UC Treatments / Results  Labs (all labs ordered are listed, but only abnormal results are displayed) Labs Reviewed - No data to display  EKG   Radiology No results found.  Procedures Procedures (including critical care time)  Medications Ordered in UC Medications - No data to display  Initial Impression / Assessment and Plan / UC Course  I have reviewed the triage vital signs and the nursing notes.  Pertinent labs & imaging results that were available during my care of the patient were reviewed by me and considered in my medical decision making (see chart for details).    Arthritis involving multiple joints   Treatment per discharge medication orders. Recommend heat applications. Follow-up with orthopedics if symptoms worsen or do not improve. Final Clinical Impressions(s) / UC Diagnoses   Final diagnoses:  Arthritis of multiple sites     Discharge Instructions      Take Prednisone 20 mg,  in mornings with breakfast as follows:  Take 3 pills for 3 days, Take 2 pills for 3 days, and Take 1 pill for 3 days.   Complete all medication.      ED Prescriptions     Medication Sig Dispense Auth. Provider   predniSONE (DELTASONE) 20 MG tablet Take 3 PO QAM x3days, 2 PO QAM x3days, 1 PO QAM x3days 18 tablet Bing Neighbors, FNP   methocarbamol (ROBAXIN) 500 MG tablet Take 1 tablet (500 mg total) by mouth 4 (four) times daily. 30 tablet Bing Neighbors, FNP      PDMP not reviewed this encounter.   Bing Neighbors,  FNP 03/02/21 [phone]

## 2021-02-22 NOTE — ED Triage Notes (Signed)
Pain to RT side of neck,  LT shoulder, LT lower back , LT hip x 2weeks.  Denies any injury.

## 2021-03-17 DIAGNOSIS — R140 Abdominal distension (gaseous): Secondary | ICD-10-CM | POA: Insufficient documentation

## 2021-03-21 ENCOUNTER — Other Ambulatory Visit: Payer: Self-pay | Admitting: Family Medicine

## 2021-03-24 ENCOUNTER — Encounter: Payer: Self-pay | Admitting: Orthopedic Surgery

## 2021-03-24 ENCOUNTER — Ambulatory Visit: Payer: BC Managed Care – PPO | Admitting: Orthopedic Surgery

## 2021-03-24 ENCOUNTER — Ambulatory Visit: Payer: BC Managed Care – PPO

## 2021-03-24 ENCOUNTER — Other Ambulatory Visit: Payer: Self-pay

## 2021-03-24 VITALS — BP 139/83 | HR 78 | Ht 68.0 in | Wt 250.0 lb

## 2021-03-24 DIAGNOSIS — M545 Low back pain, unspecified: Secondary | ICD-10-CM | POA: Diagnosis not present

## 2021-03-24 DIAGNOSIS — M5410 Radiculopathy, site unspecified: Secondary | ICD-10-CM | POA: Diagnosis not present

## 2021-03-24 DIAGNOSIS — M4317 Spondylolisthesis, lumbosacral region: Secondary | ICD-10-CM | POA: Diagnosis not present

## 2021-03-24 MED ORDER — PREDNISONE 10 MG PO TABS: 10.0000 mg | ORAL_TABLET | Freq: Three times a day (TID) | ORAL | 2 refills | Status: DC

## 2021-03-24 NOTE — Progress Notes (Signed)
New to the practice  Chief Complaint  Patient presents with   Hip Pain    Xrays from Feb 2022 c/o left hip pain lateral     History this is a 50 year old female presents with a 6 to 23-month history of pain in her left hip and lower back radiating to the left leg associated with occasional giving way  She went to urgent care received a muscle relaxer and prednisone which she said did help but did not get rid of her symptoms  Medical history as recorded and reviewed.  No surgical or medical history contributing to present illness  Exam  BP 139/83   Pulse 78   Ht 5\' 8"  (1.727 m)   Wt 250 lb (113.4 kg)   BMI 38.01 kg/m   She is awake and alert she is overweight  Her mood is pleasant her affect is normal  She walks without a limp  Her back is tender on the left side and in the lower L4-5 S1 region  Her lower extremity showed normal hip and knee motion without pain no strength deficits weakness neurologic findings or pulse abnormalities extremities are warm to touch skin is normal  Images in the offic today reveals L4-5 spondylolisthesis grade 1 facet arthritis in this region  Outside films were read today and I see x-rays of both hips and the pelvis and there are no acute findings or significant degenerative changes in either hip   Encounter Diagnoses  Name Primary?   Lumbar pain    Radicular pain of left lower extremity Yes   Spondylolisthesis, lumbosacral region     She should be able to manage this without any surgical intervention.  I put her on prednisone for 2 weeks and recommended physical therapy if she does not get better she is welcome to call back   Meds ordered this encounter  Medications   predniSONE (DELTASONE) 10 MG tablet    Sig: Take 1 tablet (10 mg total) by mouth 3 (three) times daily.    Dispense:  42 tablet    Refill:  2

## 2021-03-24 NOTE — Patient Instructions (Signed)
Physical therapy has been ordered for you at [facility]. They should call you to schedule, [phone] is the phone number to call, if you want to call to schedule.   

## 2021-04-14 DIAGNOSIS — E669 Obesity, unspecified: Secondary | ICD-10-CM | POA: Diagnosis not present

## 2021-04-14 DIAGNOSIS — N926 Irregular menstruation, unspecified: Secondary | ICD-10-CM | POA: Diagnosis not present

## 2021-04-14 DIAGNOSIS — E282 Polycystic ovarian syndrome: Secondary | ICD-10-CM | POA: Diagnosis not present

## 2021-04-14 DIAGNOSIS — R519 Headache, unspecified: Secondary | ICD-10-CM | POA: Diagnosis not present

## 2021-04-14 DIAGNOSIS — I10 Essential (primary) hypertension: Secondary | ICD-10-CM | POA: Diagnosis not present

## 2021-04-25 ENCOUNTER — Emergency Department
Admission: EM | Admit: 2021-04-25 | Discharge: 2021-04-26 | Disposition: A | Discharge status: Home / Self Care | Payer: BC Managed Care – PPO | Attending: Emergency Medicine | Admitting: Emergency Medicine

## 2021-04-25 ENCOUNTER — Encounter: Payer: Self-pay | Admitting: Emergency Medicine

## 2021-04-25 ENCOUNTER — Emergency Department: Payer: BC Managed Care – PPO

## 2021-04-25 ENCOUNTER — Other Ambulatory Visit: Payer: Self-pay

## 2021-04-25 DIAGNOSIS — R197 Diarrhea, unspecified: Secondary | ICD-10-CM | POA: Diagnosis not present

## 2021-04-25 DIAGNOSIS — Z90710 Acquired absence of both cervix and uterus: Secondary | ICD-10-CM | POA: Diagnosis not present

## 2021-04-25 DIAGNOSIS — J45909 Unspecified asthma, uncomplicated: Secondary | ICD-10-CM | POA: Diagnosis not present

## 2021-04-25 DIAGNOSIS — R1031 Right lower quadrant pain: Secondary | ICD-10-CM | POA: Diagnosis not present

## 2021-04-25 DIAGNOSIS — R109 Unspecified abdominal pain: Secondary | ICD-10-CM | POA: Diagnosis not present

## 2021-04-25 DIAGNOSIS — F17210 Nicotine dependence, cigarettes, uncomplicated: Secondary | ICD-10-CM | POA: Diagnosis not present

## 2021-04-25 DIAGNOSIS — K5730 Diverticulosis of large intestine without perforation or abscess without bleeding: Secondary | ICD-10-CM | POA: Diagnosis not present

## 2021-04-25 DIAGNOSIS — I10 Essential (primary) hypertension: Secondary | ICD-10-CM | POA: Insufficient documentation

## 2021-04-25 DIAGNOSIS — Z79899 Other long term (current) drug therapy: Secondary | ICD-10-CM | POA: Insufficient documentation

## 2021-04-25 DIAGNOSIS — R509 Fever, unspecified: Secondary | ICD-10-CM | POA: Diagnosis not present

## 2021-04-25 DIAGNOSIS — Z9049 Acquired absence of other specified parts of digestive tract: Secondary | ICD-10-CM | POA: Diagnosis not present

## 2021-04-25 LAB — COMPREHENSIVE METABOLIC PANEL
ALT: 30 U/L (ref 0–44)
AST: 26 U/L (ref 15–41)
Albumin: 3.8 g/dL (ref 3.5–5.0)
Alkaline Phosphatase: 163 U/L — ABNORMAL HIGH (ref 38–126)
Anion gap: 9 (ref 5–15)
BUN: 12 mg/dL (ref 6–20)
CO2: 24 mmol/L (ref 22–32)
Calcium: 9.2 mg/dL (ref 8.9–10.3)
Chloride: 100 mmol/L (ref 98–111)
Creatinine, Ser: 0.81 mg/dL (ref 0.44–1.00)
GFR, Estimated: >60 mL/min (ref >60)
Glucose, Bld: 163 mg/dL — ABNORMAL HIGH (ref 70–99)
Potassium: 3.7 mmol/L (ref 3.5–5.1)
Sodium: 133 mmol/L — ABNORMAL LOW (ref 135–145)
Total Bilirubin: 0.7 mg/dL (ref 0.3–1.2)
Total Protein: 7.4 g/dL (ref 6.5–8.1)

## 2021-04-25 LAB — CBC
HCT: 39.1 % (ref 36.0–46.0)
Hemoglobin: 13.4 g/dL (ref 12.0–15.0)
MCH: 30 pg (ref 26.0–34.0)
MCHC: 34.3 g/dL (ref 30.0–36.0)
MCV: 87.7 fL (ref 80.0–100.0)
Platelets: 246 10*3/uL (ref 150–400)
RBC: 4.46 MIL/uL (ref 3.87–5.11)
RDW: 13.1 % (ref 11.5–15.5)
WBC: 5.4 10*3/uL (ref 4.0–10.5)
nRBC: 0 % (ref 0.0–0.2)

## 2021-04-25 LAB — LIPASE, BLOOD: Lipase: 36 U/L (ref 11–51)

## 2021-04-25 MED ORDER — OXYCODONE-ACETAMINOPHEN 5-325 MG PO TABS
1.0000 | ORAL_TABLET | Freq: Once | ORAL | Status: AC
  Administered 2021-04-26: 1 via ORAL
  Filled 2021-04-25: qty 1

## 2021-04-25 NOTE — ED Notes (Signed)
Dr. Veronese at the bedside ?

## 2021-04-25 NOTE — ED Provider Notes (Signed)
Emergency Medicine Provider Triage Evaluation Note  Mckenzie Anderson, a 50 y.o. female  was evaluated in triage.  Pt complains of RLQ abdominal pain and right LBP. She notes onset 3 days prior with a Tmax of 102 F last night. She has diarrhea but denies dysuria.  Review of Systems  Positive: RLQ abd pain, Fever, diarrhea Negative: dysuria  Physical Exam  BP (!) 147/91   Pulse 97   Temp 99 F (37.2 C) (Oral)   Resp 18   Ht 5\' 8"  (1.727 m)   Wt 115.7 kg   SpO2 98%   BMI 38.77 kg/m  Gen:   Awake, no distress  NAD Resp:  Normal effort CTA MSK:   Moves extremities without difficulty  Other:   ABD: soft, tender to palp over the RLQ  Medical Decision Making  Medically screening exam initiated at 3:25 PM.  Appropriate orders placed.  was informed that the remainder of the evaluation will be completed by another provider, this initial triage assessment does not replace that evaluation, and the importance of remaining in the ED until their evaluation is complete.  Patient with ED evaluation of RLQ pain and back pain.    Mckenzie Lime, PA-C 04/25/21 1549    06/25/21, MD 04/26/21 2103

## 2021-04-25 NOTE — ED Triage Notes (Signed)
[facility] c/o lower abdominal pain that started around 3 days ago. Pt states she has RLQ pain and lower back pain. Pt still has appendix Pt states she had fever of 102 last night- took tylenol.   Pt states she has hematuria, denies dysuria  Angelique Blonder, [place] present for MSE

## 2021-04-26 ENCOUNTER — Emergency Department: Payer: BC Managed Care – PPO

## 2021-04-26 DIAGNOSIS — Z90710 Acquired absence of both cervix and uterus: Secondary | ICD-10-CM | POA: Diagnosis not present

## 2021-04-26 DIAGNOSIS — R1031 Right lower quadrant pain: Secondary | ICD-10-CM | POA: Diagnosis not present

## 2021-04-26 LAB — URINALYSIS, COMPLETE (UACMP) WITH MICROSCOPIC
Bacteria, UA: NONE SEEN
Bilirubin Urine: NEGATIVE
Glucose, UA: NEGATIVE mg/dL
Ketones, ur: NEGATIVE mg/dL
Leukocytes,Ua: NEGATIVE
Nitrite: NEGATIVE
Protein, ur: NEGATIVE mg/dL
Specific Gravity, Urine: 1.008 (ref 1.005–1.030)
pH: 5 (ref 5.0–8.0)

## 2021-04-26 LAB — POC URINE PREG, ED: Preg Test, Ur: NEGATIVE

## 2021-04-26 MED ORDER — CYCLOBENZAPRINE HCL 10 MG PO TABS: 10.0000 mg | ORAL_TABLET | Freq: Three times a day (TID) | ORAL | 0 refills | Status: DC | PRN

## 2021-04-26 MED ORDER — ONDANSETRON 4 MG PO TBDP
4.0000 mg | ORAL_TABLET | Freq: Three times a day (TID) | ORAL | 0 refills | Status: DC | PRN
Start: 2021-04-26 — End: 2021-08-13

## 2021-04-26 MED ORDER — DICYCLOMINE HCL 10 MG PO CAPS: 10.0000 mg | ORAL_CAPSULE | Freq: Four times a day (QID) | ORAL | 0 refills | Status: DC

## 2021-04-26 NOTE — ED Notes (Signed)
Patient in Ultrasound.

## 2021-04-26 NOTE — Discharge Instructions (Signed)

## 2021-04-26 NOTE — ED Provider Notes (Signed)
[facility] Emergency Department Provider Note  ____________________________________________  Time seen: Approximately 1:59 AM  I have reviewed the triage vital signs and the nursing notes.   HISTORY  Chief Complaint Abdominal Pain   HPI Mckenzie Anderson is a 50 y.o. female with a history of hypertension, migraines, asthma, cholecystectomy, hysterectomy who presents for evaluation of abdominal pain.  Patient reports 3 days of constant sharp right lower quadrant abdominal pain.  Last night had a fever of 102F.  The pain is moderate to severe, radiates to the right lower back.  Denies dysuria but has noted some blood in her urine.  She denies vomiting, nausea, diarrhea, constipation, chest pain, shortness of breath, vaginal discharge, vaginal bleeding.  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 Authorizing Provider  cyclobenzaprine (FLEXERIL) 10 MG tablet Take 1 tablet (10 mg total) by mouth 3 (three) times daily as needed for muscle spasms. 04/26/21  Yes Don Perking, [place], [place]  dicyclomine (BENTYL) 10 MG capsule Take 1 capsule (10 mg total) by mouth 4 (four) times daily for 14 days. 04/26/21 05/10/21 Yes Quamel Fitzmaurice, [place], MD  ondansetron (ZOFRAN ODT) 4 MG disintegrating tablet Take 1 tablet (4 mg total) by mouth every 8 (eight) hours as needed. 04/26/21  Yes Terilyn Sano, [place], MD  amLODipine (NORVASC) 10 MG tablet Take 10 mg by  mouth daily.     [provider]  Cholecalciferol (VITAMIN D3) 1.25 MG (50000 UT) CAPS Take 50,000 Units by mouth once a week. 11/28/19   [provider]  fluticasone (FLONASE) 50 MCG/ACT nasal spray Place 1 spray into both nostrils daily as needed for allergies.  10/07/19   [provider]  losartan (COZAAR) 100 MG tablet Take 100 mg by mouth daily. 01/23/20   [provider]  metoprolol tartrate (LOPRESSOR) 25 MG tablet Take 25 mg by mouth 2 (two) times daily. 07/31/19   [provider]  predniSONE (DELTASONE) 10 MG tablet Take 1 tablet (10 mg total) by mouth 3 (three) times daily. 03/24/21   Vickki Hearing, MD  spironolactone (ALDACTONE) 25 MG tablet Take 25 mg by mouth daily. 01/23/20   [provider]    Allergies Metronidazole and Sulfa antibiotics  Family History  Problem Relation Age of Onset   Kidney cancer Father    Cancer Other    Seizures Other        Grandmother's sister   Seizures Maternal Aunt    Breast cancer Maternal Grandmother 75   Migraines Neg Hx    Bladder Cancer Neg Hx     Social History Social History   Tobacco Use   Smoking status: Some Days    Types: Cigarettes   Smokeless tobacco: Never  Substance Use Topics   Alcohol use: No   Drug use: No    Review of Systems  Constitutional: + fever. Eyes: Negative for  visual changes. ENT: Negative for sore throat. Neck: No neck pain  Cardiovascular: Negative for chest pain. Respiratory: Negative for shortness of breath. Gastrointestinal: + RLQ abdominal pain. No vomiting or diarrhea. Genitourinary: Negative for dysuria. + hematuria Musculoskeletal: Negative for back pain. Skin: Negative for rash. Neurological: Negative for headaches, weakness or numbness. Psych: No SI or HI  ____________________________________________   PHYSICAL EXAM:  VITAL SIGNS: ED Triage Vitals  Enc Vitals Group     BP 04/25/21 1519 (!) 147/91     Pulse Rate 04/25/21 1519  97     Resp 04/25/21 1519 18     Temp 04/25/21 1519 99 F (37.2 C)     Temp Source 04/25/21 1519 Oral     SpO2 04/25/21 1519 98 %     Weight 04/25/21 1522 255 lb (115.7 kg)     Height 04/25/21 1522 5\' 8"  (1.727 m)     Head Circumference --      Peak Flow --      Pain Score 04/25/21 1521 10     Pain Loc --      Pain Edu? --      Excl. in GC? --     Constitutional: Alert and oriented. Well appearing and in no apparent distress. HEENT:      Head: Normocephalic and atraumatic.         Eyes: Conjunctivae are normal. Sclera is non-icteric.       Mouth/Throat: Mucous membranes are moist.       Neck: Supple with no signs of meningismus. Cardiovascular: Regular rate and rhythm. No murmurs, gallops, or rubs. 2+ symmetrical distal pulses are present in all extremities. No JVD. Respiratory: Normal respiratory effort. Lungs are clear to auscultation bilaterally.  Gastrointestinal: Soft, tender to palpation over the RLQ, non distended with positive bowel sounds. No rebound or guarding. Genitourinary: No CVA tenderness. Musculoskeletal:  No edema, cyanosis, or erythema of extremities. Neurologic: Normal speech and language. Face is symmetric. Moving all extremities. No gross focal neurologic deficits are appreciated. Skin: Skin is warm, dry and intact. No rash noted. Psychiatric: Mood and affect are normal. Speech and behavior are normal.  ____________________________________________   LABS (all labs ordered are listed, but only abnormal results are displayed)  Labs Reviewed  COMPREHENSIVE METABOLIC PANEL - Abnormal; Notable for the following components:      Result Value   Sodium 133 (*)    Glucose, Bld 163 (*)    Alkaline Phosphatase 163 (*)    All other components within normal limits  URINALYSIS, COMPLETE (UACMP) WITH MICROSCOPIC - Abnormal; Notable for the following components:   Color, Urine YELLOW (*)    APPearance HAZY (*)    Hgb urine dipstick SMALL (*)    All other  components within normal limits  LIPASE, BLOOD  CBC  POC URINE PREG, ED   ____________________________________________  EKG  ED ECG REPORT I, 06/25/21, the attending physician, personally viewed and interpreted this ECG.  Normal sinus rhythm, rate of 90, normal intervals, normal axis, no ST elevations or depressions.  Normal EKG. ____________________________________________  RADIOLOGY  I have personally reviewed the images performed during this visit and I agree with the Radiologist's read.   Interpretation by Radiologist:  CT ABDOMEN PELVIS WO CONTRAST  Result Date: 04/25/2021 CLINICAL DATA:  Right lower quadrant pain for 3 days. Fever. Diarrhea. EXAM: CT ABDOMEN AND PELVIS WITHOUT CONTRAST TECHNIQUE: Multidetector CT imaging of the abdomen and pelvis was performed following the standard protocol without IV contrast. COMPARISON:  01/17/2020 from [facility] FINDINGS: Lower chest: No acute findings. Hepatobiliary: No mass visualized on this unenhanced exam. Prior cholecystectomy. No evidence of biliary obstruction. Pancreas: No mass or inflammatory process visualized on this unenhanced exam. Spleen:  Within normal limits in size. Adrenals/Urinary tract: No evidence of urolithiasis or hydronephrosis. Unremarkable unopacified urinary bladder. Stomach/Bowel: No evidence of obstruction, inflammatory process, or abnormal fluid collections. Normal appendix visualized. Mild sigmoid diverticulosis noted, without evidence of diverticulitis. Vascular/Lymphatic: No pathologically enlarged lymph nodes identified. No evidence of abdominal aortic aneurysm. Reproductive: Prior hysterectomy noted. Adnexal regions are unremarkable in appearance. Other:  None. Musculoskeletal:  No suspicious bone lesions identified. IMPRESSION: No evidence of appendicitis or other acute findings. Mild sigmoid diverticulosis, without radiographic evidence of diverticulitis. Electronically Signed   By: Danae Orleans M.D.   On: 04/25/2021 18:25   US PELVIC COMPLETE W TRANSVAGINAL AND TORSION R/O  Result Date: 04/26/2021 CLINICAL DATA:  Right lower quadrant pain for 3 days. Prior hysterectomy. EXAM: TRANSABDOMINAL AND TRANSVAGINAL ULTRASOUND OF PELVIS DOPPLER ULTRASOUND OF OVARIES TECHNIQUE: Both transabdominal and transvaginal ultrasound examinations of the pelvis were performed. Transabdominal technique was performed for global imaging of the pelvis including uterus, ovaries, adnexal regions, and pelvic cul-de-sac. It was necessary to proceed with endovaginal exam following the transabdominal exam to visualize the ovaries. Color and duplex Doppler ultrasound was utilized to evaluate blood flow to the ovaries. COMPARISON:  CT 04/25/2021 FINDINGS: Uterus Uterus is surgically absent. Endometrium Surgically absent. Right ovary Measurements: 4 x 2.4 x 2.8 cm = volume: 14 mL. Normal appearance/no adnexal mass. Left ovary Measurements: 2.8 x 1.5 x 1.8 cm = volume: 4 mL. Normal appearance/no adnexal mass. Pulsed Doppler evaluation of both ovaries demonstrates normal low-resistance arterial and venous waveforms. Flow is demonstrated in both ovaries on color flow Doppler imaging. Other findings No abnormal free fluid. IMPRESSION: Surgical absence of the uterus. Both ovaries are visualized and appear normal. No evidence of abnormal adnexal mass or ovarian torsion. Electronically Signed   By: Burman Nieves M.D.   On: 04/26/2021 01:48      ____________________________________________   PROCEDURES  Procedure(s) performed: None Procedures   Critical Care performed:  None ____________________________________________   INITIAL IMPRESSION / ASSESSMENT AND PLAN / ED COURSE  50 y.o. female with a history of hypertension, migraines, asthma, cholecystectomy, hysterectomy who presents for evaluation of 3 days of constant RLQ abdominal pain radiating to the lower back, fever last night, questionable hematuria.  Patient  is well-appearing in no distress with normal vital signs.  She is tender to palpation on the right lower quadrant with no rebound or guarding.  Differential diagnosis including appendicitis versus diverticulitis versus ovarian pathology versus UTI versus pyelonephritis.  CT abdomen pelvis with no acute findings.  Patient was sent for transvaginal ultrasound which is also unremarkable.  Pregnancy test negative.  UA with small amount of hematuria but no signs of UTI.  Patient has had positive hematuria since 2012.  Lipase and LFTs are within normal limits.  No leukocytosis.  Unclear etiology of patient's pain at this time.  With negative work-up this could possibly be musculoskeletal.  Will discharge home with a prescription for Bentyl, Flexeril, and Zofran.  Recommended follow-up with PCP and discussed my standard return precautions.  Old medical records reviewed.      _____________________________________________ Please note:  Patient was evaluated in Emergency Department today for the symptoms described in the history of present illness. Patient was evaluated in the context of the global COVID-19 pandemic,  which necessitated consideration that the patient might be at risk for infection with the SARS-CoV-2 virus that causes COVID-19. Institutional protocols and algorithms that pertain to the evaluation of patients at risk for COVID-19 are in a state of rapid change based on information released by regulatory bodies including the CDC and federal and state organizations. These policies and algorithms were followed during the patient's care in the ED.  Some ED evaluations and interventions may be delayed as a result of limited staffing during the pandemic.   [place] Controlled Substance Database was reviewed by me. ____________________________________________   FINAL CLINICAL IMPRESSION(S) / ED DIAGNOSES   Final diagnoses:  RLQ abdominal pain      NEW MEDICATIONS STARTED DURING THIS VISIT:  ED  Discharge Orders          Ordered    dicyclomine (BENTYL) 10 MG capsule  4 times daily        04/26/21 0205    cyclobenzaprine (FLEXERIL) 10 MG tablet  3 times daily PRN        04/26/21 0205    ondansetron (ZOFRAN ODT) 4 MG disintegrating tablet  Every 8 hours PRN        04/26/21 0205             Note:  This document was prepared using Dragon voice recognition software and may include unintentional dictation errors.    Nita Sickle, MD 04/26/21 [phone]

## 2021-04-26 NOTE — ED Notes (Signed)
Patient return from US

## 2021-04-26 NOTE — ED Notes (Signed)
Patient remains in US.

## 2021-07-07 DIAGNOSIS — R1011 Right upper quadrant pain: Secondary | ICD-10-CM | POA: Diagnosis not present

## 2021-07-07 DIAGNOSIS — R1310 Dysphagia, unspecified: Secondary | ICD-10-CM | POA: Diagnosis not present

## 2021-07-07 DIAGNOSIS — R1031 Right lower quadrant pain: Secondary | ICD-10-CM | POA: Diagnosis not present

## 2021-07-07 DIAGNOSIS — R748 Abnormal levels of other serum enzymes: Secondary | ICD-10-CM | POA: Diagnosis not present

## 2021-07-07 DIAGNOSIS — K219 Gastro-esophageal reflux disease without esophagitis: Secondary | ICD-10-CM | POA: Diagnosis not present

## 2021-07-09 DIAGNOSIS — K219 Gastro-esophageal reflux disease without esophagitis: Secondary | ICD-10-CM | POA: Insufficient documentation

## 2021-07-09 HISTORY — DX: Gastro-esophageal reflux disease without esophagitis: K21.9

## 2021-07-14 DIAGNOSIS — I119 Hypertensive heart disease without heart failure: Secondary | ICD-10-CM | POA: Diagnosis not present

## 2021-07-14 DIAGNOSIS — B159 Hepatitis A without hepatic coma: Secondary | ICD-10-CM | POA: Diagnosis not present

## 2021-07-14 DIAGNOSIS — R7303 Prediabetes: Secondary | ICD-10-CM | POA: Diagnosis not present

## 2021-07-14 DIAGNOSIS — E559 Vitamin D deficiency, unspecified: Secondary | ICD-10-CM | POA: Diagnosis not present

## 2021-07-14 DIAGNOSIS — E039 Hypothyroidism, unspecified: Secondary | ICD-10-CM | POA: Diagnosis not present

## 2021-07-14 DIAGNOSIS — R5383 Other fatigue: Secondary | ICD-10-CM | POA: Diagnosis not present

## 2021-07-14 DIAGNOSIS — I10 Essential (primary) hypertension: Secondary | ICD-10-CM | POA: Diagnosis not present

## 2021-07-25 ENCOUNTER — Other Ambulatory Visit: Payer: Self-pay | Admitting: Adult Health

## 2021-07-25 DIAGNOSIS — B159 Hepatitis A without hepatic coma: Secondary | ICD-10-CM | POA: Diagnosis not present

## 2021-07-25 DIAGNOSIS — B1910 Unspecified viral hepatitis B without hepatic coma: Secondary | ICD-10-CM

## 2021-07-25 DIAGNOSIS — R1011 Right upper quadrant pain: Secondary | ICD-10-CM

## 2021-07-28 ENCOUNTER — Other Ambulatory Visit: Payer: BC Managed Care – PPO

## 2021-08-04 ENCOUNTER — Ambulatory Visit
Admission: RE | Admit: 2021-08-04 | Discharge: 2021-08-04 | Disposition: A | Discharge status: Home / Self Care | Payer: BC Managed Care – PPO | Source: Ambulatory Visit | Attending: Adult Health | Admitting: Adult Health

## 2021-08-04 ENCOUNTER — Other Ambulatory Visit: Payer: Self-pay

## 2021-08-04 DIAGNOSIS — K449 Diaphragmatic hernia without obstruction or gangrene: Secondary | ICD-10-CM | POA: Diagnosis not present

## 2021-08-04 DIAGNOSIS — K5730 Diverticulosis of large intestine without perforation or abscess without bleeding: Secondary | ICD-10-CM | POA: Diagnosis not present

## 2021-08-04 DIAGNOSIS — R1011 Right upper quadrant pain: Secondary | ICD-10-CM

## 2021-08-04 DIAGNOSIS — B1910 Unspecified viral hepatitis B without hepatic coma: Secondary | ICD-10-CM

## 2021-08-04 DIAGNOSIS — M160 Bilateral primary osteoarthritis of hip: Secondary | ICD-10-CM | POA: Diagnosis not present

## 2021-08-04 DIAGNOSIS — Z9049 Acquired absence of other specified parts of digestive tract: Secondary | ICD-10-CM | POA: Diagnosis not present

## 2021-08-04 MED ORDER — IOPAMIDOL (ISOVUE-300) INJECTION 61%
100.0000 mL | Freq: Once | INTRAVENOUS | Status: AC | PRN
  Administered 2021-08-04: 100 mL via INTRAVENOUS

## 2021-08-12 ENCOUNTER — Encounter: Payer: Self-pay | Admitting: Infectious Diseases

## 2021-08-12 ENCOUNTER — Other Ambulatory Visit: Payer: Self-pay

## 2021-08-12 ENCOUNTER — Ambulatory Visit (INDEPENDENT_AMBULATORY_CARE_PROVIDER_SITE_OTHER): Payer: BC Managed Care – PPO | Admitting: Infectious Diseases

## 2021-08-12 VITALS — BP 157/100 | HR 61 | Temp 98.0°F | Wt 251.0 lb

## 2021-08-12 DIAGNOSIS — Z789 Other specified health status: Secondary | ICD-10-CM | POA: Diagnosis not present

## 2021-08-12 DIAGNOSIS — R748 Abnormal levels of other serum enzymes: Secondary | ICD-10-CM

## 2021-08-12 DIAGNOSIS — Z1159 Encounter for screening for other viral diseases: Secondary | ICD-10-CM

## 2021-08-12 DIAGNOSIS — F17200 Nicotine dependence, unspecified, uncomplicated: Secondary | ICD-10-CM | POA: Diagnosis not present

## 2021-08-12 DIAGNOSIS — R109 Unspecified abdominal pain: Secondary | ICD-10-CM | POA: Diagnosis not present

## 2021-08-12 NOTE — Progress Notes (Signed)
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 However, patient tells me she was referred for hepatitis B. I do not have positive test results for Hepatitis B. I discussed with patient regarding requesting Hepatitis B results from PCP or blood testing in our clinic. She opted to do Hep B serological testing today.  Has been struggling pain in the RLQ abdomen for approx a year. Pain is continuous, can go up to 8/10, denies aggravating and relieving factors, radiation to Rt lower back. Sleep also gets disturbed. Denies nausea, vomiting. Denies blood in stool. Also has bile taste in her mouth. Has taken pantoprazole which did not help. Following GI and has been started on linzess after which she started having loose stools. Her joints hurt mostly in her knees and arms bilaterally. Appetite is good and has gained weight. Denies GU symptoms or vaginal discharge or bleeding   Smokes 1 pack of ciagrettes a day. No alcohol and illicit drugs  Lives with mother and 3 children  Denies being sexually active   FH - Father lung ca ( also wore a colostomy), maternal uncle colon ca, grandmother breast ca  EGD and colonoscopy planned 08/18/32  Per GI notes ( [facility])  09/17/17: EGD (indic: dysphagia, GERD) - LA-A reflux esophagitis, path + minimal reactive changes w/o EOE or Barrett's. Benign-appearing, intrinsic mild stenosis at GEJ (dilated). Minimal chronic gastritis in antrum, negative for H pylori. Small hiatal hernia. Normal duodenum. : CSY (indic: lower abd pain, constipation) - 4 mm TA in transverse colon.  Moderate stool from rectum to descending colon. Grade I internal hemorrhoids. Repeat in 09/2020 per Dr. Alice Reichert.    04/25/2021 CT A/P WO contrast for RLQ abd pain: IMPRESSION: No evidence of appendicitis or other acute findings. Mild sigmoid diverticulosis, without radiographic evidence of diverticulitis.  04/25/2021: Transvaginal US: for RLQ abd pain: IMPRESSION: Surgical absence of the uterus. Both ovaries are visualized and appear normal. No evidence of abnormal adnexal mass or ovarian torsion.  Review of Systems: ROS all systems reviewed and negative except as above   Past Medical History:  Diagnosis Date   Asthma    Gross hematuria    Heartburn    Hypertension    Migraines    Past Surgical History:  Procedure Laterality Date   ABDOMINAL HYSTERECTOMY     CARPAL TUNNEL RELEASE     CHOLECYSTECTOMY      Social History   Tobacco Use   Smoking status: Some Days    Types: Cigarettes   Smokeless tobacco: Never  Substance Use Topics   Alcohol use: No   Drug use: No    Family History  Problem Relation Age of Onset   Kidney cancer Father    Cancer Other    Seizures Other        Grandmother's sister   Seizures Maternal Aunt    Breast cancer Maternal Grandmother 85   Migraines Neg Hx    Bladder Cancer Neg Hx     Allergies  Allergen Reactions   Metronidazole Other (See Comments)   Sulfa Antibiotics  Health Maintenance  Topic Date Due   COVID-19 Vaccine (1) Never done   Hepatitis C Screening  Never done   TETANUS/TDAP  Never done   PAP SMEAR-Modifier  Never done   COLONOSCOPY (Pts 45-26yrs Insurance coverage will need to be confirmed)  Never done   Zoster Vaccines- Shingrix (1 of 2) Never done   INFLUENZA VACCINE  Never done   MAMMOGRAM  12/15/2022   HIV Screening  Completed   HPV VACCINES  Aged Out    Objective: BP (!) 157/100    Pulse 61    Temp 98 F (36.7 C) (Oral)    Wt 251 lb (113.9 kg)    BMI 38.16 kg/m   Physical Exam Constitutional:       Appearance: Normal appearance.  obese HENT:     Head: Normocephalic and atraumatic.      Mouth: Mucous membranes are moist.  Eyes:    Conjunctiva/sclera: Conjunctivae normal.     Pupils: Pupils are equal, round  Cardiovascular:     Rate and Rhythm: Normal rate and regular rhythm.     Heart sounds:   Pulmonary:     Effort: Pulmonary effort is normal.     Breath sounds: Normal breath sounds.   Abdominal:     General: Non distended     Palpations: soft. Minimal tenderness in the RLQ, no RT, BS+  Musculoskeletal:        General: Normal range of motion.   Skin:    General: Skin is warm and dry.     Comments:  Neurological:     General: grossly non focal     Mental Status: awake, alert and oriented to person, place, and time.   Psychiatric:        Mood and Affect: Mood normal.   Lab Results Lab Results  Component Value Date   WBC 5.4 04/25/2021   HGB 13.4 04/25/2021   HCT 39.1 04/25/2021   MCV 87.7 04/25/2021   PLT 246 04/25/2021    Lab Results  Component Value Date   CREATININE 0.81 04/25/2021   BUN 12 04/25/2021   NA 133 (L) 04/25/2021   K 3.7 04/25/2021   CL 100 04/25/2021   CO2 24 04/25/2021    Lab Results  Component Value Date   ALT 30 04/25/2021   AST 26 04/25/2021   ALKPHOS 163 (H) 04/25/2021   BILITOT 0.7 04/25/2021    No results found for: CHOL, HDL, LDLCALC, LDLDIRECT, TRIG, CHOLHDL No results found for: LABRPR, RPRTITER No results found for: HIV1RNAQUANT, HIV1RNAVL, CD4TABS  Problem List Items Addressed This Visit       Other   Abdominal pain - Primary   Relevant Orders   Hepatitis B core antibody, total   Hepatitis B surface antibody,qualitative   Hepatitis B surface antigen   Hepatitis C antibody   Need for hepatitis B screening test   Immune to hepatitis A   Smoking   Alkaline phosphatase elevation    Assessment/Plan Chronic RLQ abdominal pain - following GI, planned for EGD and colonoscopy on 08/18/21 Will check for Hep B  serology for confirmation Counseled for smoking cessation  Fu to be made if lab positive for Hepatitis B   I have personally spent  70 minutes involved in face-to-face and non-face-to-face activities for this patient on the day of the visit. Professional time spent includes the following activities: Preparing to see the patient (review of tests), Obtaining and/or reviewing separately obtained history (admission/discharge record), Performing a medically  appropriate examination and/or evaluation , Ordering medications/tests/procedures, referring and communicating with other health care professionals, Documenting clinical information in the EMR, Independently interpreting results (not separately reported), Communicating results to the patient/family/caregiver, Counseling and educating the patient/family/caregiver and Care coordination (not separately reported).   Wilber Oliphant, [facility] 08/12/2021, 2:16 PM

## 2021-08-13 DIAGNOSIS — R109 Unspecified abdominal pain: Secondary | ICD-10-CM | POA: Insufficient documentation

## 2021-08-13 DIAGNOSIS — R748 Abnormal levels of other serum enzymes: Secondary | ICD-10-CM | POA: Insufficient documentation

## 2021-08-13 DIAGNOSIS — Z1159 Encounter for screening for other viral diseases: Secondary | ICD-10-CM | POA: Insufficient documentation

## 2021-08-13 DIAGNOSIS — Z789 Other specified health status: Secondary | ICD-10-CM | POA: Insufficient documentation

## 2021-08-13 DIAGNOSIS — F17200 Nicotine dependence, unspecified, uncomplicated: Secondary | ICD-10-CM | POA: Insufficient documentation

## 2021-08-15 LAB — HEPATITIS B SURFACE ANTIBODY,QUALITATIVE: Hep B S Ab: REACTIVE — AB

## 2021-08-15 LAB — HEPATITIS B CORE ANTIBODY, TOTAL: Hep B Core Total Ab: NONREACTIVE

## 2021-08-15 LAB — HEPATITIS B SURFACE ANTIGEN: Hepatitis B Surface Ag: NONREACTIVE

## 2021-08-15 LAB — HEPATITIS C ANTIBODY
Hepatitis C Ab: NONREACTIVE
SIGNAL TO CUT-OFF: <0.02 (ref <1.00)

## 2021-08-18 DIAGNOSIS — K3189 Other diseases of stomach and duodenum: Secondary | ICD-10-CM | POA: Diagnosis not present

## 2021-08-18 DIAGNOSIS — K2970 Gastritis, unspecified, without bleeding: Secondary | ICD-10-CM | POA: Diagnosis not present

## 2021-08-18 DIAGNOSIS — D123 Benign neoplasm of transverse colon: Secondary | ICD-10-CM | POA: Diagnosis not present

## 2021-08-18 DIAGNOSIS — D125 Benign neoplasm of sigmoid colon: Secondary | ICD-10-CM | POA: Diagnosis not present

## 2021-08-18 DIAGNOSIS — K640 First degree hemorrhoids: Secondary | ICD-10-CM | POA: Diagnosis not present

## 2021-08-18 DIAGNOSIS — R1031 Right lower quadrant pain: Secondary | ICD-10-CM | POA: Diagnosis not present

## 2021-08-30 DIAGNOSIS — R12 Heartburn: Secondary | ICD-10-CM | POA: Insufficient documentation

## 2021-08-30 DIAGNOSIS — K5909 Other constipation: Secondary | ICD-10-CM | POA: Insufficient documentation

## 2021-08-30 DIAGNOSIS — K219 Gastro-esophageal reflux disease without esophagitis: Secondary | ICD-10-CM | POA: Diagnosis not present

## 2021-08-30 DIAGNOSIS — R1031 Right lower quadrant pain: Secondary | ICD-10-CM | POA: Diagnosis not present

## 2021-08-30 DIAGNOSIS — Z86010 Personal history of colonic polyps: Secondary | ICD-10-CM | POA: Diagnosis not present

## 2021-09-06 DIAGNOSIS — Z86010 Personal history of colonic polyps: Secondary | ICD-10-CM | POA: Insufficient documentation

## 2021-09-06 DIAGNOSIS — Z860101 Personal history of adenomatous and serrated colon polyps: Secondary | ICD-10-CM

## 2021-09-06 HISTORY — DX: Personal history of adenomatous and serrated colon polyps: Z86.0101

## 2021-10-13 DIAGNOSIS — B159 Hepatitis A without hepatic coma: Secondary | ICD-10-CM | POA: Diagnosis not present

## 2021-10-13 DIAGNOSIS — I10 Essential (primary) hypertension: Secondary | ICD-10-CM | POA: Diagnosis not present

## 2021-10-13 DIAGNOSIS — R7303 Prediabetes: Secondary | ICD-10-CM | POA: Diagnosis not present

## 2021-10-13 DIAGNOSIS — D519 Vitamin B12 deficiency anemia, unspecified: Secondary | ICD-10-CM | POA: Diagnosis not present

## 2021-10-13 DIAGNOSIS — E039 Hypothyroidism, unspecified: Secondary | ICD-10-CM | POA: Diagnosis not present

## 2021-10-13 DIAGNOSIS — E559 Vitamin D deficiency, unspecified: Secondary | ICD-10-CM | POA: Diagnosis not present

## 2021-10-13 DIAGNOSIS — E7800 Pure hypercholesterolemia, unspecified: Secondary | ICD-10-CM | POA: Diagnosis not present

## 2021-10-15 ENCOUNTER — Emergency Department (HOSPITAL_COMMUNITY)
Admission: EM | Admit: 2021-10-15 | Discharge: 2021-10-15 | Disposition: A | Discharge status: Home / Self Care | Payer: BC Managed Care – PPO | Attending: Emergency Medicine | Admitting: Emergency Medicine

## 2021-10-15 ENCOUNTER — Other Ambulatory Visit: Payer: Self-pay

## 2021-10-15 ENCOUNTER — Encounter (HOSPITAL_COMMUNITY): Payer: Self-pay | Admitting: Emergency Medicine

## 2021-10-15 DIAGNOSIS — Z79899 Other long term (current) drug therapy: Secondary | ICD-10-CM | POA: Diagnosis not present

## 2021-10-15 DIAGNOSIS — R799 Abnormal finding of blood chemistry, unspecified: Secondary | ICD-10-CM | POA: Diagnosis not present

## 2021-10-15 DIAGNOSIS — R5383 Other fatigue: Secondary | ICD-10-CM | POA: Diagnosis not present

## 2021-10-15 DIAGNOSIS — Z711 Person with feared health complaint in whom no diagnosis is made: Secondary | ICD-10-CM | POA: Diagnosis not present

## 2021-10-15 DIAGNOSIS — R739 Hyperglycemia, unspecified: Secondary | ICD-10-CM | POA: Insufficient documentation

## 2021-10-15 DIAGNOSIS — R899 Unspecified abnormal finding in specimens from other organs, systems and tissues: Secondary | ICD-10-CM | POA: Diagnosis not present

## 2021-10-15 LAB — CBC
HCT: 40.2 % (ref 36.0–46.0)
Hemoglobin: 13.7 g/dL (ref 12.0–15.0)
MCH: 30.6 pg (ref 26.0–34.0)
MCHC: 34.1 g/dL (ref 30.0–36.0)
MCV: 89.7 fL (ref 80.0–100.0)
Platelets: 353 10*3/uL (ref 150–400)
RBC: 4.48 MIL/uL (ref 3.87–5.11)
RDW: 13.2 % (ref 11.5–15.5)
WBC: 6.9 10*3/uL (ref 4.0–10.5)
nRBC: 0 % (ref 0.0–0.2)

## 2021-10-15 LAB — URINALYSIS, ROUTINE W REFLEX MICROSCOPIC
Bilirubin Urine: NEGATIVE
Glucose, UA: NEGATIVE mg/dL
Hgb urine dipstick: NEGATIVE
Ketones, ur: NEGATIVE mg/dL
Leukocytes,Ua: NEGATIVE
Nitrite: NEGATIVE
Protein, ur: NEGATIVE mg/dL
Specific Gravity, Urine: 1.021 (ref 1.005–1.030)
pH: 5 (ref 5.0–8.0)

## 2021-10-15 LAB — BASIC METABOLIC PANEL
Anion gap: 8 (ref 5–15)
BUN: 15 mg/dL (ref 6–20)
CO2: 24 mmol/L (ref 22–32)
Calcium: 10 mg/dL (ref 8.9–10.3)
Chloride: 107 mmol/L (ref 98–111)
Creatinine, Ser: 0.92 mg/dL (ref 0.44–1.00)
GFR, Estimated: >60 mL/min (ref >60)
Glucose, Bld: 114 mg/dL — ABNORMAL HIGH (ref 70–99)
Potassium: 4.2 mmol/L (ref 3.5–5.1)
Sodium: 139 mmol/L (ref 135–145)

## 2021-10-15 LAB — I-STAT BETA HCG BLOOD, ED (MC, WL, AP ONLY): I-stat hCG, quantitative: <5 m[IU]/mL (ref <5)

## 2021-10-15 NOTE — ED Provider Notes (Signed)
?[facility] EMERGENCY DEPARTMENT ?Provider Note ? ? ?CSN: [id number] ?Arrival date & time: 10/15/21  1901 ? ?  ?History ? ?Chief Complaint  ?Patient presents with  ? Abnormal Lab  ? ? ?Mckenzie Anderson is a 51 y.o. female here for evaluation abnormal labs. Apparently was having labs drawn for routine follow up. Was called and told sodium was 119.  No history of similar.  Does states she has chronically elevated alkaline phosphatase which they have not been able to find a reason for it.  Multiple scans and EGD/ colonoscopies. Does states she occasionally has some lightheadedness and dizziness, none currently.  Has intermittent headaches however none currently.  Does states she feels persistently fatigued for "months and months."  No recent illnesses. States she has been eating and drinking "normally."  States she drinks a lot of "sun drop soda." No chest pain, shortness of breath, abdominal pain, diarrhea, dysuria. No hx of electrolyte abnormalities.  No chronic EtOH use ? ?PCP- Rexene Agent, [facility] in [place] ? ?HPI ? ?  ? ?Home Medications ?Prior to Admission medications   ?Medication Sig Start Date End Date Taking? Authorizing Provider  ?amLODipine (NORVASC) 10 MG tablet Take 10 mg by mouth daily.     [provider]  ?Cholecalciferol (VITAMIN D3) 1.25 MG (50000 UT) CAPS Take 50,000 Units by mouth once a week. 11/28/19   [provider]  ?dicyclomine (BENTYL) 10 MG capsule Take 1 capsule (10 mg total) by mouth 4 (four) times daily for 14 days. 04/26/21 05/10/21  Nita Sickle, MD  ?ferrous sulfate 325 (65 FE) MG EC tablet Take 1 tablet by mouth daily. 07/14/21   [provider]  ?fluticasone (FLONASE) 50 MCG/ACT nasal spray Place 1 spray into both nostrils daily as needed for allergies.  10/07/19   [provider]  ?hydrochlorothiazide (HYDRODIURIL) 25 MG tablet Take 25 mg by mouth daily. 04/14/21   [provider]  ?Karlene Einstein 145  MCG CAPS capsule Take 145 mcg by mouth daily. 07/27/21   [provider]  ?losartan (COZAAR) 100 MG tablet Take 100 mg by mouth daily. 01/23/20   [provider]  ?metoprolol tartrate (LOPRESSOR) 25 MG tablet Take 25 mg by mouth 2 (two) times daily. 07/31/19   [provider]  ?pantoprazole (PROTONIX) 40 MG tablet Take 40 mg by mouth daily. 07/14/21   [provider]  ?phentermine (ADIPEX-P) 37.5 MG tablet Take 37.5 mg by mouth daily. 07/14/21   [provider]  ?progesterone (PROMETRIUM) 200 MG capsule Take 200 mg by mouth at bedtime. 07/14/21   [provider]  ?spironolactone (ALDACTONE) 25 MG tablet Take 25 mg by mouth daily. 01/23/20   [provider]  ?   ? ?Allergies    ?Metronidazole and Sulfa antibiotics   ? ?Review of Systems   ?Review of Systems  ?Constitutional:  Positive for fatigue (chronic). Negative for chills and fever.  ?HENT: Negative.  Negative for ear pain and sore throat.   ?Eyes:  Negative for pain and visual disturbance.  ?Respiratory: Negative.  Negative for cough and shortness of breath.   ?Cardiovascular: Negative.  Negative for chest pain and palpitations.  ?Gastrointestinal: Negative.  Negative for abdominal pain and vomiting.  ?Genitourinary: Negative.  Negative for dysuria and hematuria.  ?Musculoskeletal: Negative.  Negative for arthralgias and back pain.  ?Skin: Negative.  Negative for color change and rash.  ?Neurological: Negative.  Negative for seizures and syncope.  ?All other systems reviewed and are  negative. ? ?Physical Exam ?Updated Vital Signs ?BP (!) 148/96   Pulse (!) 114   Temp 99.1 ?F (37.3 ?C) (Oral)   Resp 16   Ht 5\' 8"  (1.727 m)   Wt 112.9 kg   SpO2 97%   BMI 37.86 kg/m?  ?Physical Exam ?Vitals and nursing note reviewed.  ?Constitutional:   ?   General: She is not in acute distress. ?   Appearance: She is well-developed. She is not ill-appearing, toxic-appearing or diaphoretic.  ?HENT:  ?   Head:  Normocephalic and atraumatic.  ?   Nose: Nose normal.  ?   Mouth/Throat:  ?   Mouth: Mucous membranes are moist.  ?Eyes:  ?   Pupils: Pupils are equal, round, and reactive to light.  ?Cardiovascular:  ?   Rate and Rhythm: Normal rate.  ?   Pulses: Normal pulses.  ?   Heart sounds: Normal heart sounds.  ?Pulmonary:  ?   Effort: Pulmonary effort is normal. No respiratory distress.  ?   Breath sounds: Normal breath sounds.  ?Abdominal:  ?   General: Bowel sounds are normal. There is no distension.  ?   Palpations: Abdomen is soft.  ?Musculoskeletal:     ?   General: No swelling, tenderness, deformity or signs of injury. Normal range of motion.  ?   Cervical back: Normal range of motion.  ?   Right lower leg: No edema.  ?   Left lower leg: No edema.  ?Skin: ?   General: Skin is warm and dry.  ?   Capillary Refill: Capillary refill takes less than 2 seconds.  ?Neurological:  ?   General: No focal deficit present.  ?   Mental Status: She is alert and oriented to person, place, and time.  ?   Cranial Nerves: Cranial nerves 2-12 are intact.  ?   Sensory: Sensation is intact.  ?   Motor: Motor function is intact.  ?   Coordination: Coordination is intact.  ?   Gait: Gait is intact.  ?Psychiatric:     ?   Mood and Affect: Mood normal.  ? ? ?ED Results / Procedures / Treatments   ?Labs ?(all labs ordered are listed, but only abnormal results are displayed) ?Labs Reviewed  ?BASIC METABOLIC PANEL - Abnormal; Notable for the following components:  ?    Result Value  ? Glucose, Bld 114 (*)   ? All other components within normal limits  ?CBC  ?URINALYSIS, ROUTINE W REFLEX MICROSCOPIC  ?I-STAT BETA HCG BLOOD, ED ([facility], [facility], [facility] ONLY)  ?CBG MONITORING, ED  ? ? ?EKG ?None ? ?Radiology ?No results found. ? ?Procedures ?Procedures  ? ? ?Medications Ordered in ED ?Medications - No data to display ? ?ED Course/ Medical Decision Making/ A&P ?  ? ?51 year old here for evaluation abnormal lab.  Was at PCP given blood work for normal follow-up.   Was told she had hyponatremia at 119.  No history of similar.  Says she does have some chronic fatigue at baseline which has been going on for quite some time. No change in this, states she feels "normal."  No changes in diet is eating and drinking as normal.  She is compliant with her home medications.  No confusions, lethargy.  She is nonfocal neuro exam without deficits.  Heart and lungs clear. Abdomen soft, nontender.  Appears clinically well-hydrated.  Unable to review her labs from her PCP office. ? ?Labs personally viewed and interpreted: ? ?CBC without leukocytosis ?Metabolic  panel normal sodium at 139, glucose 114 ?UA negative for infection ?Preg neg ? ?Discussed results patient.  Unclear if her earlier findings at PCP were lab error however labs reassuring here today with sodium of 139.  She is currently asymptomatic.  We will have her keep close follow-up with her PCP. ? ?The patient has been appropriately medically screened and/or stabilized in the ED. I have low suspicion for any other emergent medical condition which would require further screening, evaluation or treatment in the ED or require inpatient management. ? ?Patient is hemodynamically stable and in no acute distress.  Patient able to ambulate in department prior to ED.  Evaluation does not show acute pathology that would require ongoing or additional emergent interventions while in the emergency department or further inpatient treatment.  I have discussed the diagnosis with the patient and answered all questions.  Pain is been managed while in the emergency department and patient has no further complaints prior to discharge.  Patient is comfortable with plan discussed in room and is stable for discharge at this time.  I have discussed strict return precautions for returning to the emergency department.  Patient was encouraged to follow-up with PCP/specialist refer to at discharge.  ? ?                        ?Medical Decision Making ?Amount  and/or Complexity of Data Reviewed ?External Data Reviewed: labs, radiology and notes. ?Labs: ordered. Decision-making details documented in ED Course. ? ?Risk ?Diagnosis or treatment significantly limited by social d

## 2021-10-15 NOTE — ED Triage Notes (Signed)
Pt reports her PCP called her and told her to go to the ED, her sodium levels was off (119).  Pt reports she has not felt well for quite a while. Fatigue has been an issue over the past couple of months ? ?

## 2021-10-15 NOTE — Discharge Instructions (Addendum)
Your labs today were reassuring without any significant abnormality ? ?Follow-up with your primary care provider ? ?Sure to drink electrolyte rich drinks over the next few days ? ?Return for any worsening symptoms ?

## 2021-10-18 DIAGNOSIS — B159 Hepatitis A without hepatic coma: Secondary | ICD-10-CM | POA: Diagnosis not present

## 2021-10-18 DIAGNOSIS — I10 Essential (primary) hypertension: Secondary | ICD-10-CM | POA: Diagnosis not present

## 2021-12-01 ENCOUNTER — Ambulatory Visit: Admit: 2021-12-01 | Payer: BC Managed Care – PPO

## 2021-12-02 ENCOUNTER — Ambulatory Visit
Admission: EM | Admit: 2021-12-02 | Discharge: 2021-12-02 | Disposition: A | Discharge status: Home / Self Care | Payer: BC Managed Care – PPO | Attending: Family Medicine | Admitting: Family Medicine

## 2021-12-02 VITALS — BP 176/95 | HR 96 | Temp 97.9°F | Resp 18

## 2021-12-02 DIAGNOSIS — R319 Hematuria, unspecified: Secondary | ICD-10-CM | POA: Insufficient documentation

## 2021-12-02 DIAGNOSIS — R102 Pelvic and perineal pain: Secondary | ICD-10-CM | POA: Diagnosis not present

## 2021-12-02 LAB — POCT URINALYSIS DIP (MANUAL ENTRY)
Bilirubin, UA: NEGATIVE
Glucose, UA: NEGATIVE mg/dL
Ketones, POC UA: NEGATIVE mg/dL
Nitrite, UA: NEGATIVE
Spec Grav, UA: >=1.03 — AB (ref 1.010–1.025)
Urobilinogen, UA: 0.2 E.U./dL
pH, UA: 5 (ref 5.0–8.0)

## 2021-12-02 MED ORDER — CEPHALEXIN 500 MG PO CAPS: 500.0000 mg | ORAL_CAPSULE | Freq: Two times a day (BID) | ORAL | 0 refills | Status: DC

## 2021-12-02 MED ORDER — KETOROLAC TROMETHAMINE 30 MG/ML IJ SOLN
30.0000 mg | Freq: Once | INTRAMUSCULAR | Status: AC
  Administered 2021-12-02: 30 mg via INTRAMUSCULAR

## 2021-12-02 MED ORDER — TAMSULOSIN HCL 0.4 MG PO CAPS: 0.4000 mg | ORAL_CAPSULE | Freq: Every day | ORAL | 0 refills | Status: DC

## 2021-12-02 NOTE — ED Triage Notes (Signed)
Constant lower ABD pain and lower back pain.  States its hard to stand up straight.  Pressure on urination.  ABD pain since August.  States she has seen 2 different doctors with no diagnoses.   States when lifting something, pain feels sharp and stabbing.

## 2021-12-04 LAB — URINE CULTURE: Culture: NO GROWTH

## 2021-12-04 NOTE — ED Provider Notes (Signed)
[facility]    CSN: [id number] Arrival date & time: 12/02/21  1002      History   Chief Complaint Chief Complaint  Patient presents with   Abdominal Pain    I'm having really bad pains in my lower abdomen and lower back. If I lift things the pain is a sharp stabbing pain. Resting the pain is dull and nagging and never goes away. I feel pressure whenever I use the restroom. I'm sore to the touch. - Entered by patient    HPI Mckenzie Anderson is a 51 y.o. female.   Presenting today with about 1 year of constant sharp lower abdominal pain radiating sometimes to the back.  Worse with positional changes, lifting or bending.  Denies fever, chills, vomiting, diarrhea, constipation, melena, weight loss.  Has not been worked up by gastroenterology, infectious disease, emergency department and PCP all with no obvious cause of her pain.  Past surgical history to include abdominal hysterectomy, cholecystectomy.  Has tried numerous GERD medications, Linzess, Bentyl with no relief.  Currently the pain has gotten worse over the past week or so.  Some pressure on urination.   Past Medical History:  Diagnosis Date   Asthma    Gross hematuria    Heartburn    Hypertension    Migraines     Patient Active Problem List   Diagnosis Date Noted   Abdominal pain 08/13/2021   Need for hepatitis B screening test 08/13/2021   Immune to hepatitis A 08/13/2021   Smoking 08/13/2021   Alkaline phosphatase elevation 08/13/2021   Abdominal bloating 03/17/2021   Peripheral neuritis 01/11/2018   Rupture of right distal biceps tendon 01/11/2018   Migraine 01/08/2016   Muscle cramp 01/08/2016   HTN (hypertension) 01/08/2016    Past Surgical History:  Procedure Laterality Date   ABDOMINAL HYSTERECTOMY     CARPAL TUNNEL RELEASE     CHOLECYSTECTOMY      OB History   No obstetric history on file.      Home Medications    Prior to Admission medications   Medication Sig Start Date End  Date Taking? Authorizing Provider  amLODipine (NORVASC) 10 MG tablet Take 10 mg by mouth daily.     [provider]  cephALEXin (KEFLEX) 500 MG capsule Take 1 capsule (500 mg total) by mouth 2 (two) times daily. 12/02/21  Yes Particia Nearing, PA-C  Cholecalciferol (VITAMIN D3) 1.25 MG (50000 UT) CAPS Take 50,000 Units by mouth once a week. 11/28/19   [provider]  dicyclomine (BENTYL) 10 MG capsule Take 1 capsule (10 mg total) by mouth 4 (four) times daily for 14 days. 04/26/21 05/10/21  Nita Sickle, MD  ferrous sulfate 325 (65 FE) MG EC tablet Take 1 tablet by mouth daily. 07/14/21   [provider]  fluticasone (FLONASE) 50 MCG/ACT nasal spray Place 1 spray into both nostrils daily as needed for allergies.  10/07/19   [provider]  hydrochlorothiazide (HYDRODIURIL) 25 MG tablet Take 25 mg by mouth daily. 04/14/21   [provider]  LINZESS 145 MCG CAPS capsule Take 145 mcg by mouth daily. 07/27/21   [provider]  losartan (COZAAR) 100 MG tablet Take 100 mg by mouth daily. 01/23/20   [provider]  metoprolol tartrate (LOPRESSOR) 25 MG tablet Take 25 mg by mouth 2 (two) times daily. 07/31/19   [provider]  pantoprazole (PROTONIX) 40 MG tablet Take 40 mg by mouth daily. 07/14/21  [provider]  phentermine (ADIPEX-P) 37.5 MG tablet Take 37.5 mg by mouth daily. 07/14/21   [provider]  progesterone (PROMETRIUM) 200 MG capsule Take 200 mg by mouth at bedtime. 07/14/21   [provider]  spironolactone (ALDACTONE) 25 MG tablet Take 25 mg by mouth daily. 01/23/20   [provider]  tamsulosin (FLOMAX) 0.4 MG CAPS capsule Take 1 capsule (0.4 mg total) by mouth daily. 12/02/21  Yes Particia NearingLane, Nikea Settle Elizabeth, PA-C    Family History Family History  Problem Relation Age of Onset   Kidney cancer Father    Cancer Other    Seizures Other        Grandmother's sister   Seizures  Maternal Aunt    Breast cancer Maternal Grandmother [phone]   Migraines Neg Hx    Bladder Cancer Neg Hx     Social History Social History   Tobacco Use   Smoking status: Some Days    Types: Cigarettes   Smokeless tobacco: Never  Substance Use Topics   Alcohol use: No   Drug use: No     Allergies   Metronidazole and Sulfa antibiotics   Review of Systems Review of Systems Per HPI  Physical Exam Triage Vital Signs ED Triage Vitals  Enc Vitals Group     BP 12/02/21 1011 (!) 176/95     Pulse Rate 12/02/21 1011 96     Resp 12/02/21 1011 18     Temp 12/02/21 1011 97.9 F (36.6 C)     Temp Source 12/02/21 1011 Oral     SpO2 12/02/21 1011 96 %     Weight --      Height --      Head Circumference --      Peak Flow --      Pain Score 12/02/21 1013 6     Pain Loc --      Pain Edu? --      Excl. in GC? --    No data found.  Updated Vital Signs BP (!) 176/95 (BP Location: Right Arm)   Pulse 96   Temp 97.9 F (36.6 C) (Oral)   Resp 18   SpO2 96%   Visual Acuity Right Eye Distance:   Left Eye Distance:   Bilateral Distance:    Right Eye Near:   Left Eye Near:    Bilateral Near:     Physical Exam Vitals and nursing note reviewed.  Constitutional:      Appearance: Normal appearance. She is not ill-appearing.  HENT:     Head: Atraumatic.     Mouth/Throat:     Mouth: Mucous membranes are moist.     Pharynx: Oropharynx is clear.  Eyes:     Extraocular Movements: Extraocular movements intact.     Conjunctiva/sclera: Conjunctivae normal.  Cardiovascular:     Rate and Rhythm: Normal rate and regular rhythm.     Heart sounds: Normal heart sounds.  Pulmonary:     Effort: Pulmonary effort is normal.     Breath sounds: Normal breath sounds.  Abdominal:     General: Bowel sounds are normal. There is no distension.     Palpations: Abdomen is soft.     Tenderness: There is abdominal tenderness. There is no right CVA tenderness, left CVA tenderness or guarding.      Comments: Diffuse lower abdominal tenderness to palpation without distention or guarding  Musculoskeletal:        General: Normal range of motion.  Cervical back: Normal range of motion and neck supple.  Skin:    General: Skin is warm and dry.  Neurological:     Mental Status: She is alert and oriented to person, place, and time.  Psychiatric:        Mood and Affect: Mood normal.        Thought Content: Thought content normal.        Judgment: Judgment normal.     UC Treatments / Results  Labs (all labs ordered are listed, but only abnormal results are displayed) Labs Reviewed  POCT URINALYSIS DIP (MANUAL ENTRY) - Abnormal; Notable for the following components:      Result Value   Spec Grav, UA >=1.030 (*)    Blood, UA trace-lysed (*)    Protein Ur, POC trace (*)    Leukocytes, UA Small (1+) (*)    All other components within normal limits  URINE CULTURE    EKG   Radiology No results found.  Procedures Procedures (including critical care time)  Medications Ordered in UC Medications  ketorolac (TORADOL) 30 MG/ML injection 30 mg (30 mg Intramuscular Given 12/02/21 1057)    Initial Impression / Assessment and Plan / UC Course  I have reviewed the triage vital signs and the nursing notes.  Pertinent labs & imaging results that were available during my care of the patient were reviewed by me and considered in my medical decision making (see chart for details).     Leuks present in urine, urine culture pending, treat with antibiotics, fluids in case no urinary tract infection we will also restart Flomax in case kidney stone given trace lysed blood and urine sample.  No stones have shown up on her past CT scans since onset of symptoms however she has had mild relief from Flomax in the past.  Continue to follow-up with specialist and PCP.  Final Clinical Impressions(s) / UC Diagnoses   Final diagnoses:  Suprapubic pain  Hematuria, unspecified type   Discharge  Instructions   None    ED Prescriptions     Medication Sig Dispense Auth. Provider   cephALEXin (KEFLEX) 500 MG capsule Take 1 capsule (500 mg total) by mouth 2 (two) times daily. 10 capsule Particia Nearing, [place]   tamsulosin (FLOMAX) 0.4 MG CAPS capsule Take 1 capsule (0.4 mg total) by mouth daily. 14 capsule Particia Nearing, [place]      PDMP not reviewed this encounter.   Particia Nearing, [place] 12/04/21 [phone]

## 2021-12-19 ENCOUNTER — Encounter: Payer: Self-pay | Admitting: Nurse Practitioner

## 2021-12-19 ENCOUNTER — Ambulatory Visit (INDEPENDENT_AMBULATORY_CARE_PROVIDER_SITE_OTHER): Payer: BC Managed Care – PPO | Admitting: Nurse Practitioner

## 2021-12-19 VITALS — BP 138/88 | HR 94 | Temp 98.4°F

## 2021-12-19 DIAGNOSIS — I10 Essential (primary) hypertension: Secondary | ICD-10-CM

## 2021-12-19 DIAGNOSIS — K5900 Constipation, unspecified: Secondary | ICD-10-CM

## 2021-12-19 DIAGNOSIS — Z7689 Persons encountering health services in other specified circumstances: Secondary | ICD-10-CM | POA: Diagnosis not present

## 2021-12-19 DIAGNOSIS — K5790 Diverticulosis of intestine, part unspecified, without perforation or abscess without bleeding: Secondary | ICD-10-CM | POA: Diagnosis not present

## 2021-12-19 DIAGNOSIS — Z720 Tobacco use: Secondary | ICD-10-CM

## 2021-12-19 DIAGNOSIS — Z79899 Other long term (current) drug therapy: Secondary | ICD-10-CM

## 2021-12-19 DIAGNOSIS — M791 Myalgia, unspecified site: Secondary | ICD-10-CM

## 2021-12-19 DIAGNOSIS — R109 Unspecified abdominal pain: Secondary | ICD-10-CM | POA: Diagnosis not present

## 2021-12-19 DIAGNOSIS — M79643 Pain in unspecified hand: Secondary | ICD-10-CM

## 2021-12-19 NOTE — Progress Notes (Signed)
I,Tianna Badgett,acting as a Education administrator for [employer], FNP.,have documented all relevant documentation on the behalf of Minette Brine, FNP,as directed by  Minette Brine, FNP while in the presence of Minette Brine, [place].  This visit occurred during the SARS-CoV-2 public health emergency.  Safety protocols were in place, including screening questions prior to the visit, additional usage of staff PPE, and extensive cleaning of exam room while observing appropriate contact time as indicated for disinfecting solutions.  Subjective:     Patient ID: Mckenzie Anderson , female    DOB: 1970/10/28 , 51 y.o.   MRN: [id number]   Chief Complaint  Patient presents with   Establish Care    HPI  Patient is here to establish care. She had been followed at [facility] in [place], last seen 3 months ago. She has also been followed at the [facility] clinic for GI. She has 3 daughters. She is divorced. She works at a cigarette factory.   She has been dealing with lower abdomen pain since October 2022. She reports having various scans and when she shows me on her phone the site of the clinic she did have a CT scan of abdomen and pelvis with and without contrast, no significant findings other than diverticulosis but no active diverticulitis. She also has a small hiatal hernia. She was treated for a UTI in May. She admits to not exercising regularly.   She has also feels she is on too much blood pressure medication. She does not take her spironalactone every day due to having to urinate. She takes her metoprolol once a day instead of two times a day.   She has been seen by Cardiology in the past Dr. Bennett Scrape in 2021.      Past Medical History:  Diagnosis Date   Asthma    Gross hematuria    Heartburn    Hypertension    Migraines      Family History  Problem Relation Age of Onset   Hypertension Mother    Diabetes Mother    Gout Mother    Cancer Father    Kidney cancer Father     Seizures Maternal Aunt    Cancer Maternal Grandmother    Breast cancer Maternal Grandmother 34   Hypertension Maternal Grandfather    Obesity Maternal Grandfather    Diabetes Paternal Grandmother    Diabetes Paternal Grandfather    Cancer Other    Seizures Other        Grandmother's sister   Migraines Neg Hx    Bladder Cancer Neg Hx      Current Outpatient Medications:    amLODipine (NORVASC) 10 MG tablet, Take 10 mg by mouth daily. , Disp: , Rfl:    Cholecalciferol (VITAMIN D3) 1.25 MG (50000 UT) CAPS, Take 50,000 Units by mouth once a week., Disp: , Rfl:    ferrous sulfate 325 (65 FE) MG EC tablet, Take 1 tablet by mouth daily., Disp: , Rfl:    fluticasone (FLONASE) 50 MCG/ACT nasal spray, Place 1 spray into both nostrils daily as needed for allergies. , Disp: , Rfl:    LINZESS 145 MCG CAPS capsule, Take 145 mcg by mouth daily., Disp: , Rfl:    losartan (COZAAR) 100 MG tablet, Take 100 mg by mouth daily., Disp: , Rfl:    metoprolol tartrate (LOPRESSOR) 25 MG tablet, Take 25 mg by mouth 2 (two) times daily., Disp: , Rfl:    pantoprazole (PROTONIX) 40 MG tablet, Take 40 mg by  mouth daily., Disp: , Rfl:    spironolactone (ALDACTONE) 25 MG tablet, Take 25 mg by mouth daily., Disp: , Rfl:    tamsulosin (FLOMAX) 0.4 MG CAPS capsule, Take 1 capsule (0.4 mg total) by mouth daily., Disp: 14 capsule, Rfl: 0   Allergies  Allergen Reactions   Metronidazole Other (See Comments)   Sulfa Antibiotics      Review of Systems  Constitutional: Negative.   Respiratory: Negative.    Cardiovascular: Negative.   Gastrointestinal:  Positive for abdominal pain (lower abdomen, sharp shooting pain at times then at times is dull and sometimes will feel pain in her back.) and constipation (takes linzess every other day 145 mcg - when taking daily unable to go to work due to having diarrhea.).  Musculoskeletal:  Positive for myalgias (upper thigh muscle cramps to the point of "debilitating" her for 2 days  and reports havint 13 points of 20 at her job at this time).       Bilateral hand pain with the left worse than the right with some swelling. She is interested in being referred for further management. She has had carpal tunnel surgery in the past.   Neurological: Negative.   Psychiatric/Behavioral: Negative.       Today's Vitals   12/19/21 1946  BP: 138/88  Pulse: 94  Temp: 98.4 F (36.9 C)  TempSrc: Oral   There is no height or weight on file to calculate BMI.   Objective:  Physical Exam Vitals reviewed.  Constitutional:      General: She is not in acute distress.    Appearance: Normal appearance.  Cardiovascular:     Rate and Rhythm: Normal rate and regular rhythm.     Pulses: Normal pulses.     Heart sounds: Normal heart sounds. No murmur heard. Pulmonary:     Effort: Pulmonary effort is normal. No respiratory distress.     Breath sounds: Normal breath sounds. No wheezing.  Abdominal:     General: Abdomen is flat. Bowel sounds are normal. There is no distension.     Palpations: Abdomen is soft. There is no mass.     Tenderness: There is abdominal tenderness (bilateral lower abdomen near bladder area).  Skin:    Capillary Refill: Capillary refill takes less than 2 seconds.  Neurological:     General: No focal deficit present.     Mental Status: She is alert and oriented to person, place, and time.     Cranial Nerves: No cranial nerve deficit.     Motor: Weakness present.  Psychiatric:        Mood and Affect: Mood normal.        Behavior: Behavior normal.        Thought Content: Thought content normal.        Judgment: Judgment normal.         Assessment And Plan:     1. Primary hypertension Comments: Blood pressure is fairly controlled today.  - CMP14+EGFR  2. Abdominal pain, unspecified abdominal location Comments: This has been ongoing, will check H Pylori  - H Pylori, IGM, IGG, IGA AB  3. Diverticulosis Comments: This was diagnosed when she had her  CT abdomen/pelvis without acute diverticulitis  4. Constipation, unspecified constipation type Comments: She is taking Linzess 145 mg every other day due to having diarrhea  5. Myalgia Comments: Unsure if related to dehydration vs a muscular problem. Will check CK and Mg. Encouraged to continue taking Magnesium and stay well hydrated with  water.  - Magnesium - CK, total  6. Other long term (current) drug therapy - CBC  7. Tobacco abuse  8. Encounter to establish care  9. Pain of hand, unspecified laterality - Ambulatory referral to Hand Surgery     Patient was given opportunity to ask questions. Patient verbalized understanding of the plan and was able to repeat key elements of the plan. All questions were answered to their satisfaction.  Minette Brine, FNP   I, Minette Brine, FNP, have reviewed all documentation for this visit. The documentation on 12/19/21 for the exam, diagnosis, procedures, and orders are all accurate and complete.   IF YOU HAVE BEEN REFERRED TO A SPECIALIST, IT MAY TAKE 1-2 WEEKS TO SCHEDULE/PROCESS THE REFERRAL. IF YOU HAVE NOT HEARD FROM US/SPECIALIST IN TWO WEEKS, PLEASE GIVE [place] A CALL AT [phone] X [phone].   THE PATIENT IS ENCOURAGED TO PRACTICE SOCIAL DISTANCING DUE TO THE COVID-19 PANDEMIC.

## 2021-12-19 NOTE — Patient Instructions (Signed)
Abdominal Pain, Adult Many things can cause belly (abdominal) pain. Most times, belly pain is not dangerous. Many cases of belly pain can be watched and treated at home. Sometimes, though, belly pain is serious. Your doctor will try to find the cause of your belly pain. Follow these instructions at home:  Medicines Take over-the-counter and prescription medicines only as told by your doctor. Do not take medicines that help you poop (laxatives) unless told by your doctor. General instructions Watch your belly pain for any changes. Drink enough fluid to keep your pee (urine) pale yellow. Keep all follow-up visits as told by your doctor. This is important. Contact a doctor if: Your belly pain changes or gets worse. You are not hungry, or you lose weight without trying. You are having trouble pooping (constipated) or have watery poop (diarrhea) for more than 2-3 days. You have pain when you pee or poop. Your belly pain wakes you up at night. Your pain gets worse with meals, after eating, or with certain foods. You are vomiting and cannot keep anything down. You have a fever. You have blood in your pee. Get help right away if: Your pain does not go away as soon as your doctor says it should. You cannot stop vomiting. Your pain is only in areas of your belly, such as the right side or the left lower part of the belly. You have bloody or black poop, or poop that looks like tar. You have very bad pain, cramping, or bloating in your belly. You have signs of not having enough fluid or water in your body (dehydration), such as: Dark pee, very little pee, or no pee. Cracked lips. Dry mouth. Sunken eyes. Sleepiness. Weakness. You have trouble breathing or chest pain. Summary Many cases of belly pain can be watched and treated at home. Watch your belly pain for any changes. Take over-the-counter and prescription medicines only as told by your doctor. Contact a doctor if your belly pain  changes or gets worse. Get help right away if you have very bad pain, cramping, or bloating in your belly. This information is not intended to replace advice given to you by your health care provider. Make sure you discuss any questions you have with your health care provider. Document Revised: 11/11/2018 Document Reviewed: 11/11/2018 Elsevier Patient Education  2023 Elsevier Inc.  

## 2021-12-26 ENCOUNTER — Other Ambulatory Visit: Payer: Self-pay | Admitting: Family Medicine

## 2021-12-27 NOTE — Telephone Encounter (Signed)
Pt not under prescriber;s care, will refuse this request.  Requested Prescriptions  Pending Prescriptions Disp Refills  . tamsulosin (FLOMAX) 0.4 MG CAPS capsule [Pharmacy Med Name: TAMSULOSIN HCL 0.4 MG CAPSULE] 14 capsule 0    Sig: TAKE 1 CAPSULE BY MOUTH EVERY DAY     There is no refill protocol information for this order

## 2022-01-06 ENCOUNTER — Encounter: Payer: Self-pay | Admitting: Nurse Practitioner

## 2022-01-16 ENCOUNTER — Other Ambulatory Visit: Payer: BC Managed Care – PPO

## 2022-01-16 DIAGNOSIS — I10 Essential (primary) hypertension: Secondary | ICD-10-CM | POA: Diagnosis not present

## 2022-01-16 DIAGNOSIS — M791 Myalgia, unspecified site: Secondary | ICD-10-CM | POA: Diagnosis not present

## 2022-01-16 DIAGNOSIS — Z79899 Other long term (current) drug therapy: Secondary | ICD-10-CM | POA: Diagnosis not present

## 2022-01-16 DIAGNOSIS — R109 Unspecified abdominal pain: Secondary | ICD-10-CM | POA: Diagnosis not present

## 2022-01-16 DIAGNOSIS — R748 Abnormal levels of other serum enzymes: Secondary | ICD-10-CM | POA: Diagnosis not present

## 2022-01-20 DIAGNOSIS — R748 Abnormal levels of other serum enzymes: Secondary | ICD-10-CM | POA: Diagnosis not present

## 2022-01-25 ENCOUNTER — Other Ambulatory Visit: Payer: Self-pay | Admitting: Nurse Practitioner

## 2022-01-25 DIAGNOSIS — R748 Abnormal levels of other serum enzymes: Secondary | ICD-10-CM

## 2022-01-26 ENCOUNTER — Other Ambulatory Visit: Payer: Self-pay | Admitting: Nurse Practitioner

## 2022-01-26 DIAGNOSIS — R748 Abnormal levels of other serum enzymes: Secondary | ICD-10-CM

## 2022-02-02 ENCOUNTER — Ambulatory Visit
Admission: RE | Admit: 2022-02-02 | Discharge: 2022-02-02 | Disposition: A | Discharge status: Home / Self Care | Payer: BC Managed Care – PPO | Source: Ambulatory Visit | Attending: Nurse Practitioner | Admitting: Nurse Practitioner

## 2022-02-02 DIAGNOSIS — R748 Abnormal levels of other serum enzymes: Secondary | ICD-10-CM | POA: Diagnosis not present

## 2022-02-02 DIAGNOSIS — R932 Abnormal findings on diagnostic imaging of liver and biliary tract: Secondary | ICD-10-CM | POA: Diagnosis not present

## 2022-02-06 ENCOUNTER — Encounter: Payer: Self-pay | Admitting: Nurse Practitioner

## 2022-02-06 ENCOUNTER — Ambulatory Visit: Payer: BC Managed Care – PPO | Admitting: Nurse Practitioner

## 2022-02-06 VITALS — BP 140/80 | HR 67 | Temp 98.4°F | Ht 68.0 in | Wt 249.6 lb

## 2022-02-06 DIAGNOSIS — Z23 Encounter for immunization: Secondary | ICD-10-CM | POA: Diagnosis not present

## 2022-02-06 DIAGNOSIS — R109 Unspecified abdominal pain: Secondary | ICD-10-CM

## 2022-02-06 DIAGNOSIS — Z807 Family history of other malignant neoplasms of lymphoid, hematopoietic and related tissues: Secondary | ICD-10-CM

## 2022-02-06 MED ORDER — DICYCLOMINE HCL 10 MG PO CAPS: 10.0000 mg | ORAL_CAPSULE | Freq: Three times a day (TID) | ORAL | 2 refills | Status: DC

## 2022-02-06 NOTE — Patient Instructions (Signed)
Td (Tetanus, Diphtheria) Vaccine: What You Need to Know 1. Why get vaccinated? Td vaccine can prevent tetanus and diphtheria. Tetanus enters the body through cuts or wounds. Diphtheria spreads from person to person. TETANUS (T) causes painful stiffening of the muscles. Tetanus can lead to serious health problems, including being unable to open the mouth, having trouble swallowing and breathing, or death. DIPHTHERIA (D) can lead to difficulty breathing, heart failure, paralysis, or death. 2. Td vaccine Td is only for children 7 years and older, adolescents, and adults.  Td is usually given as a booster dose every 10 years, or after 5 years in the case of a severe or dirty wound or burn. Another vaccine, called "Tdap," may be used instead of Td. Tdap protects against pertussis, also known as "whooping cough," in addition to tetanus and diphtheria. Td may be given at the same time as other vaccines. 3. Talk with your health care provider Tell your vaccination provider if the person getting the vaccine: Has had an allergic reaction after a previous dose of any vaccine that protects against tetanus or diphtheria, or has any severe, life-threatening allergies Has ever had Guillain-Barr Syndrome (also called "GBS") Has had severe pain or swelling after a previous dose of any vaccine that protects against tetanus or diphtheria In some cases, your health care provider may decide to postpone Td vaccination until a future visit. People with minor illnesses, such as a cold, may be vaccinated. People who are moderately or severely ill should usually wait until they recover before getting Td vaccine.  Your health care provider can give you more information. 4. Risks of a vaccine reaction Pain, redness, or swelling where the shot was given, mild fever, headache, feeling tired, and nausea, vomiting, diarrhea, or stomachache sometimes happen after Td vaccination. People sometimes faint after medical procedures,  including vaccination. Tell your provider if you feel dizzy or have vision changes or ringing in the ears.  As with any medicine, there is a very remote chance of a vaccine causing a severe allergic reaction, other serious injury, or death. 5. What if there is a serious problem? An allergic reaction could occur after the vaccinated person leaves the clinic. If you see signs of a severe allergic reaction (hives, swelling of the face and throat, difficulty breathing, a fast heartbeat, dizziness, or weakness), call 9-1-1 and get the person to the nearest hospital.  For other signs that concern you, call your health care provider.  Adverse reactions should be reported to the Vaccine Adverse Event Reporting System (VAERS). Your health care provider will usually file this report, or you can do it yourself. Visit the VAERS website at www.vaers.hhs.gov or call 1-800-822-7967. VAERS is only for reporting reactions, and VAERS staff members do not give medical advice. 6. The National Vaccine Injury Compensation Program The National Vaccine Injury Compensation Program (VICP) is a federal program that was created to compensate people who may have been injured by certain vaccines. Claims regarding alleged injury or death due to vaccination have a time limit for filing, which may be as short as two years. Visit the VICP website at www.hrsa.gov/vaccinecompensation or call 1-800-338-2382 to learn about the program and about filing a claim. 7. How can I learn more? Ask your health care provider. Call your local or state health department. Visit the website of the Food and Drug Administration (FDA) for vaccine package inserts and additional information at www.fda.gov/vaccines-blood-biologics/vaccines. Contact the Centers for Disease Control and Prevention (CDC): Call 1-800-232-4636 (1-800-CDC-INFO) or Visit   CDC's website at www.cdc.gov/vaccines. Source: CDC Vaccine Information Statement Td (Tetanus, Diphtheria) Vaccine  (02/20/2020) This same material is available at www.cdc.gov for no charge. This information is not intended to replace advice given to you by your health care provider. Make sure you discuss any questions you have with your health care provider. Document Revised: 06/01/2021 Document Reviewed: 04/04/2021 Elsevier Patient Education  2023 Elsevier Inc.  

## 2022-02-06 NOTE — Progress Notes (Signed)
Barnet Glasgow Martin,acting as a Education administrator for Minette Brine, FNP.,have documented all relevant documentation on the behalf of Minette Brine, FNP,as directed by  Minette Brine, FNP while in the presence of Minette Brine, [place].    Subjective:     Patient ID: Mckenzie Anderson , female    DOB: [birth year] , 51 y.o.   MRN: [id number]   Chief Complaint  Patient presents with   Hypertension    HPI  Patient presents today for bp check. Patient states her stomach hurts always hurt. Patient also would like her lab results. Patient wants FMLA for her abdomen pain she has 14 out 20 points.  She goes to Dr. Dawson Bills for her GI. She has an appt with [place] in January. She has been taking linzess for constipation. She was given Linzess by her previous PCP. When she takes the 144m she has loose stools. She has not been to the out of work for her stomach. After 60 days 2 points will fall off.   BP Readings from Last 3 Encounters: 02/06/22 : 140/80 12/19/21 : 138/88 12/02/21 : (!) 176/95       Past Medical History:  Diagnosis Date   Asthma    Gross hematuria    Heartburn    Hypertension    Migraines      Family History  Problem Relation Age of Onset   Hypertension Mother    Diabetes Mother    Gout Mother    Cancer Father    Kidney cancer Father    Seizures Maternal Aunt    Cancer Maternal Grandmother    Breast cancer Maternal Grandmother [phone]  Hypertension Maternal Grandfather    Obesity Maternal Grandfather    Diabetes Paternal Grandmother    Diabetes Paternal Grandfather    Cancer Other    Seizures Other        Grandmother's sister   Migraines Neg Hx    Bladder Cancer Neg Hx      Current Outpatient Medications:    amLODipine (NORVASC) 10 MG tablet, Take 10 mg by mouth daily. , Disp: , Rfl:    Cholecalciferol (VITAMIN D3) 1.25 MG (50000 UT) CAPS, Take 50,000 Units by mouth once a week., Disp: , Rfl:    dicyclomine (BENTYL) 10 MG capsule, Take 1 capsule (10 mg total) by mouth 4  (four) times daily -  before meals and at bedtime., Disp: 30 capsule, Rfl: 2   ferrous sulfate 325 (65 FE) MG EC tablet, Take 1 tablet by mouth daily., Disp: , Rfl:    fluticasone (FLONASE) 50 MCG/ACT nasal spray, Place 1 spray into both nostrils daily as needed for allergies. , Disp: , Rfl:    LINZESS 145 MCG CAPS capsule, Take 145 mcg by mouth daily., Disp: , Rfl:    losartan (COZAAR) 100 MG tablet, Take 100 mg by mouth daily., Disp: , Rfl:    metoprolol tartrate (LOPRESSOR) 25 MG tablet, Take 25 mg by mouth 2 (two) times daily., Disp: , Rfl:    spironolactone (ALDACTONE) 25 MG tablet, Take 25 mg by mouth daily., Disp: , Rfl:    tamsulosin (FLOMAX) 0.4 MG CAPS capsule, Take 1 capsule (0.4 mg total) by mouth daily., Disp: 14 capsule, Rfl: 0   amoxicillin (AMOXIL) 875 MG tablet, Take 1 tablet (875 mg total) by mouth 2 (two) times daily., Disp: 14 tablet, Rfl: 0   pantoprazole (PROTONIX) 40 MG tablet, Take 1 tablet (40 mg total) by mouth 2 (two) times daily., Disp:  28 tablet, Rfl: 0   Allergies  Allergen Reactions   Sulfa Antibiotics      Review of Systems  Constitutional: Negative.   HENT: Negative.    Eyes: Negative.   Respiratory: Negative.    Cardiovascular: Negative.   Gastrointestinal: Negative.   Endocrine: Negative.   Genitourinary: Negative.   Musculoskeletal: Negative.   Skin: Negative.   Allergic/Immunologic: Negative.   Neurological: Negative.   Hematological: Negative.   Psychiatric/Behavioral: Negative.       Today's Vitals   02/06/22 1610  BP: 140/80  Pulse: 67  Temp: 98.4 F (36.9 C)  TempSrc: Oral  Weight: 249 lb 9.6 oz (113.2 kg)  Height: _0  (1.727 m)  PainSc: 8    Body mass index is 37.95 kg/m.  Wt Readings from Last 3 Encounters:  02/06/22 249 lb 9.6 oz (113.2 kg)  08/12/21 251 lb (113.9 kg)  04/25/21 255 lb (115.7 kg)     Objective:  Physical Exam Vitals reviewed.  Constitutional:      General: She is not in acute distress.     Appearance: Normal appearance. She is obese.  Cardiovascular:     Rate and Rhythm: Normal rate and regular rhythm.     Pulses: Normal pulses.     Heart sounds: Normal heart sounds. No murmur heard. Pulmonary:     Effort: Pulmonary effort is normal. No respiratory distress.     Breath sounds: Normal breath sounds. No wheezing.  Musculoskeletal:        General: No swelling or tenderness. Normal range of motion.  Skin:    General: Skin is warm and dry.     Capillary Refill: Capillary refill takes less than 2 seconds.  Neurological:     General: No focal deficit present.     Mental Status: She is alert and oriented to person, place, and time.     Cranial Nerves: No cranial nerve deficit.     Motor: No weakness.      Assessment And Plan:     1. Abdominal pain, unspecified abdominal location Comments: Still awaiting H pylori labs, [facility] had been out of item to test. Will provide bentyl for the cramping.  - dicyclomine (BENTYL) 10 MG capsule; Take 1 capsule (10 mg total) by mouth 4 (four) times daily -  before meals and at bedtime.  Dispense: 30 capsule; Refill: 2 - Multiple Myeloma Panel (SPEP&IFE w/QIG)  2. Family history of multiple myeloma Comments: Father passed in 21 with multiple myeloma.  3. Encounter for immunization Will give tetanus vaccine today while in office. Refer to order management. TDAP will be administered to adults 6-65 years old every 10 years. - Tdap vaccine greater than or equal to 7yo IM    Reviewed records from Dr. Maudie Mercury (GI) and checked labs at [facility].   Patient was given opportunity to ask questions. Patient verbalized understanding of the plan and was able to repeat key elements of the plan. All questions were answered to their satisfaction.  Minette Brine, FNP   I, Minette Brine, FNP, have reviewed all documentation for this visit. The documentation on 02/06/22 for the exam, diagnosis, procedures, and orders are all accurate and complete.   IF YOU  HAVE BEEN REFERRED TO A SPECIALIST, IT MAY TAKE 1-2 WEEKS TO SCHEDULE/PROCESS THE REFERRAL. IF YOU HAVE NOT HEARD FROM US/SPECIALIST IN TWO WEEKS, PLEASE GIVE [place] A CALL AT [phone] X [phone].   THE PATIENT IS ENCOURAGED TO PRACTICE SOCIAL DISTANCING DUE TO THE COVID-19 PANDEMIC.

## 2022-02-12 LAB — H PYLORI, IGM, IGG, IGA AB
H pylori, IgM Abs: <9 units (ref 0.0–8.9)
H. pylori, IgA Abs: 14.9 units — ABNORMAL HIGH (ref 0.0–8.9)
H. pylori, IgG AbS: 0.44 Index Value (ref 0.00–0.79)

## 2022-02-12 LAB — CMP14+EGFR
ALT: 22 IU/L (ref 0–32)
AST: 20 IU/L (ref 0–40)
Albumin/Globulin Ratio: 1.6 (ref 1.2–2.2)
Albumin: 4.2 g/dL (ref 3.8–4.9)
Alkaline Phosphatase: 226 IU/L — ABNORMAL HIGH (ref 44–121)
BUN/Creatinine Ratio: 12 (ref 9–23)
BUN: 12 mg/dL (ref 6–24)
Bilirubin Total: 0.4 mg/dL (ref 0.0–1.2)
CO2: 21 mmol/L (ref 20–29)
Calcium: 9.6 mg/dL (ref 8.7–10.2)
Chloride: 102 mmol/L (ref 96–106)
Creatinine, Ser: 0.97 mg/dL (ref 0.57–1.00)
Globulin, Total: 2.6 g/dL (ref 1.5–4.5)
Glucose: 150 mg/dL — ABNORMAL HIGH (ref 70–99)
Potassium: 4 mmol/L (ref 3.5–5.2)
Sodium: 139 mmol/L (ref 134–144)
Total Protein: 6.8 g/dL (ref 6.0–8.5)
eGFR: 71 mL/min/{1.73_m2} (ref >59)

## 2022-02-12 LAB — CBC
Hematocrit: 38.5 % (ref 34.0–46.6)
Hemoglobin: 13.4 g/dL (ref 11.1–15.9)
MCH: 30.2 pg (ref 26.6–33.0)
MCHC: 34.8 g/dL (ref 31.5–35.7)
MCV: 87 fL (ref 79–97)
Platelets: 289 10*3/uL (ref 150–450)
RBC: 4.44 x10E6/uL (ref 3.77–5.28)
RDW: 12.8 % (ref 11.7–15.4)
WBC: 7.3 10*3/uL (ref 3.4–10.8)

## 2022-02-12 LAB — CK: Total CK: 166 U/L (ref 32–182)

## 2022-02-12 LAB — MAGNESIUM: Magnesium: 2.1 mg/dL (ref 1.6–2.3)

## 2022-02-13 ENCOUNTER — Other Ambulatory Visit: Payer: Self-pay | Admitting: Nurse Practitioner

## 2022-02-13 DIAGNOSIS — R760 Raised antibody titer: Secondary | ICD-10-CM

## 2022-02-13 LAB — MULTIPLE MYELOMA PANEL, SERUM
Albumin SerPl Elph-Mcnc: 3.7 g/dL (ref 2.9–4.4)
Albumin/Glob SerPl: 1.2 (ref 0.7–1.7)
Alpha 1: 0.2 g/dL (ref 0.0–0.4)
Alpha2 Glob SerPl Elph-Mcnc: 0.8 g/dL (ref 0.4–1.0)
B-Globulin SerPl Elph-Mcnc: 1.2 g/dL (ref 0.7–1.3)
Gamma Glob SerPl Elph-Mcnc: 1.1 g/dL (ref 0.4–1.8)
Globulin, Total: 3.3 g/dL (ref 2.2–3.9)
IgA/Immunoglobulin A, Serum: 263 mg/dL (ref 87–352)
IgG (Immunoglobin G), Serum: 1196 mg/dL (ref 586–1602)
IgM (Immunoglobulin M), Srm: 39 mg/dL (ref 26–217)
Total Protein: 7 g/dL (ref 6.0–8.5)

## 2022-02-13 MED ORDER — PANTOPRAZOLE SODIUM 40 MG PO TBEC: 40.0000 mg | DELAYED_RELEASE_TABLET | Freq: Two times a day (BID) | ORAL | 0 refills | Status: AC

## 2022-02-13 MED ORDER — METRONIDAZOLE 500 MG PO TABS: 500.0000 mg | ORAL_TABLET | Freq: Three times a day (TID) | ORAL | 0 refills | Status: AC

## 2022-02-13 MED ORDER — AMOXICILLIN 875 MG PO TABS: 875.0000 mg | ORAL_TABLET | Freq: Two times a day (BID) | ORAL | 0 refills | Status: DC

## 2022-03-02 DIAGNOSIS — K5909 Other constipation: Secondary | ICD-10-CM | POA: Diagnosis not present

## 2022-03-02 DIAGNOSIS — Z538 Procedure and treatment not carried out for other reasons: Secondary | ICD-10-CM | POA: Diagnosis not present

## 2022-03-02 DIAGNOSIS — Z86010 Personal history of colonic polyps: Secondary | ICD-10-CM | POA: Diagnosis not present

## 2022-03-02 DIAGNOSIS — R1031 Right lower quadrant pain: Secondary | ICD-10-CM | POA: Diagnosis not present

## 2022-03-02 LAB — HM COLONOSCOPY

## 2022-03-08 DIAGNOSIS — M25532 Pain in left wrist: Secondary | ICD-10-CM | POA: Diagnosis not present

## 2022-03-08 DIAGNOSIS — R2232 Localized swelling, mass and lump, left upper limb: Secondary | ICD-10-CM | POA: Diagnosis not present

## 2022-03-08 DIAGNOSIS — M7711 Lateral epicondylitis, right elbow: Secondary | ICD-10-CM | POA: Insufficient documentation

## 2022-03-08 DIAGNOSIS — M79631 Pain in right forearm: Secondary | ICD-10-CM | POA: Diagnosis not present

## 2022-03-08 HISTORY — DX: Lateral epicondylitis, right elbow: M77.11

## 2022-03-12 DIAGNOSIS — M25532 Pain in left wrist: Secondary | ICD-10-CM | POA: Diagnosis not present

## 2022-03-17 ENCOUNTER — Other Ambulatory Visit: Payer: Self-pay | Admitting: Nurse Practitioner

## 2022-03-17 DIAGNOSIS — R1031 Right lower quadrant pain: Secondary | ICD-10-CM

## 2022-03-17 DIAGNOSIS — R748 Abnormal levels of other serum enzymes: Secondary | ICD-10-CM | POA: Diagnosis not present

## 2022-03-17 DIAGNOSIS — K5909 Other constipation: Secondary | ICD-10-CM

## 2022-03-17 DIAGNOSIS — R1011 Right upper quadrant pain: Secondary | ICD-10-CM

## 2022-03-21 ENCOUNTER — Ambulatory Visit
Admission: RE | Admit: 2022-03-21 | Discharge: 2022-03-21 | Disposition: A | Discharge status: Home / Self Care | Payer: BC Managed Care – PPO | Source: Ambulatory Visit | Attending: Nurse Practitioner | Admitting: Nurse Practitioner

## 2022-03-21 ENCOUNTER — Other Ambulatory Visit: Payer: Self-pay | Admitting: Nurse Practitioner

## 2022-03-21 DIAGNOSIS — R1031 Right lower quadrant pain: Secondary | ICD-10-CM

## 2022-03-21 DIAGNOSIS — R935 Abnormal findings on diagnostic imaging of other abdominal regions, including retroperitoneum: Secondary | ICD-10-CM | POA: Diagnosis not present

## 2022-03-21 DIAGNOSIS — R1011 Right upper quadrant pain: Secondary | ICD-10-CM | POA: Diagnosis not present

## 2022-03-21 DIAGNOSIS — K5909 Other constipation: Secondary | ICD-10-CM | POA: Diagnosis not present

## 2022-03-21 DIAGNOSIS — R748 Abnormal levels of other serum enzymes: Secondary | ICD-10-CM | POA: Insufficient documentation

## 2022-03-22 DIAGNOSIS — M7711 Lateral epicondylitis, right elbow: Secondary | ICD-10-CM | POA: Diagnosis not present

## 2022-03-22 DIAGNOSIS — R2232 Localized swelling, mass and lump, left upper limb: Secondary | ICD-10-CM | POA: Diagnosis not present

## 2022-03-22 DIAGNOSIS — M25532 Pain in left wrist: Secondary | ICD-10-CM | POA: Diagnosis not present

## 2022-03-22 DIAGNOSIS — M79631 Pain in right forearm: Secondary | ICD-10-CM | POA: Diagnosis not present

## 2022-04-08 ENCOUNTER — Encounter: Payer: Self-pay | Admitting: Nurse Practitioner

## 2022-04-25 DIAGNOSIS — M65832 Other synovitis and tenosynovitis, left forearm: Secondary | ICD-10-CM | POA: Diagnosis not present

## 2022-04-25 DIAGNOSIS — M659 Synovitis and tenosynovitis, unspecified: Secondary | ICD-10-CM | POA: Diagnosis not present

## 2022-04-25 DIAGNOSIS — M67432 Ganglion, left wrist: Secondary | ICD-10-CM | POA: Diagnosis not present

## 2022-04-25 DIAGNOSIS — M7711 Lateral epicondylitis, right elbow: Secondary | ICD-10-CM | POA: Diagnosis not present

## 2022-04-28 DIAGNOSIS — K760 Fatty (change of) liver, not elsewhere classified: Secondary | ICD-10-CM | POA: Diagnosis not present

## 2022-04-28 DIAGNOSIS — Z131 Encounter for screening for diabetes mellitus: Secondary | ICD-10-CM | POA: Diagnosis not present

## 2022-04-28 DIAGNOSIS — Z13220 Encounter for screening for lipoid disorders: Secondary | ICD-10-CM | POA: Diagnosis not present

## 2022-04-28 DIAGNOSIS — Z780 Asymptomatic menopausal state: Secondary | ICD-10-CM | POA: Diagnosis not present

## 2022-05-08 ENCOUNTER — Ambulatory Visit: Payer: BC Managed Care – PPO | Admitting: Nurse Practitioner

## 2022-05-09 DIAGNOSIS — Z4789 Encounter for other orthopedic aftercare: Secondary | ICD-10-CM | POA: Diagnosis not present

## 2022-05-09 DIAGNOSIS — M25632 Stiffness of left wrist, not elsewhere classified: Secondary | ICD-10-CM | POA: Diagnosis not present

## 2022-05-15 ENCOUNTER — Encounter: Payer: Self-pay | Admitting: Nurse Practitioner

## 2022-05-15 ENCOUNTER — Ambulatory Visit: Payer: BC Managed Care – PPO | Admitting: Nurse Practitioner

## 2022-05-15 VITALS — BP 142/88 | HR 91 | Temp 98.1°F | Ht 68.0 in | Wt 256.0 lb

## 2022-05-15 DIAGNOSIS — I10 Essential (primary) hypertension: Secondary | ICD-10-CM

## 2022-05-15 DIAGNOSIS — Z6838 Body mass index (BMI) 38.0-38.9, adult: Secondary | ICD-10-CM

## 2022-05-15 DIAGNOSIS — Z2821 Immunization not carried out because of patient refusal: Secondary | ICD-10-CM | POA: Diagnosis not present

## 2022-05-15 DIAGNOSIS — R1011 Right upper quadrant pain: Secondary | ICD-10-CM

## 2022-05-15 MED ORDER — METOPROLOL TARTRATE 25 MG PO TABS: 25.0000 mg | ORAL_TABLET | Freq: Two times a day (BID) | ORAL | 1 refills | Status: DC

## 2022-05-15 MED ORDER — SPIRONOLACTONE 25 MG PO TABS: 25.0000 mg | ORAL_TABLET | Freq: Every day | ORAL | 1 refills | Status: DC

## 2022-05-15 MED ORDER — LOSARTAN POTASSIUM 100 MG PO TABS: 100.0000 mg | ORAL_TABLET | Freq: Every day | ORAL | 1 refills | Status: DC

## 2022-05-15 NOTE — Patient Instructions (Signed)
Hypertension, Adult High blood pressure (hypertension) is when the force of blood pumping through the arteries is too strong. The arteries are the blood vessels that carry blood from the heart throughout the body. Hypertension forces the heart to work harder to pump blood and may cause arteries to become narrow or stiff. Untreated or uncontrolled hypertension can lead to a heart attack, heart failure, a stroke, kidney disease, and other problems. A blood pressure reading consists of a higher number over a lower number. Ideally, your blood pressure should be below 120/80. The first ("top") number is called the systolic pressure. It is a measure of the pressure in your arteries as your heart beats. The second ("bottom") number is called the diastolic pressure. It is a measure of the pressure in your arteries as the heart relaxes. What are the causes? The exact cause of this condition is not known. There are some conditions that result in high blood pressure. What increases the risk? Certain factors may make you more likely to develop high blood pressure. Some of these risk factors are under your control, including: Smoking. Not getting enough exercise or physical activity. Being overweight. Having too much fat, sugar, calories, or salt (sodium) in your diet. Drinking too much alcohol. Other risk factors include: Having a personal history of heart disease, diabetes, high cholesterol, or kidney disease. Stress. Having a family history of high blood pressure and high cholesterol. Having obstructive sleep apnea. Age. The risk increases with age. What are the signs or symptoms? High blood pressure may not cause symptoms. Very high blood pressure (hypertensive crisis) may cause: Headache. Fast or irregular heartbeats (palpitations). Shortness of breath. Nosebleed. Nausea and vomiting. Vision changes. Severe chest pain, dizziness, and seizures. How is this diagnosed? This condition is diagnosed by  measuring your blood pressure while you are seated, with your arm resting on a flat surface, your legs uncrossed, and your feet flat on the floor. The cuff of the blood pressure monitor will be placed directly against the skin of your upper arm at the level of your heart. Blood pressure should be measured at least twice using the same arm. Certain conditions can cause a difference in blood pressure between your right and left arms. If you have a high blood pressure reading during one visit or you have normal blood pressure with other risk factors, you may be asked to: Return on a different day to have your blood pressure checked again. Monitor your blood pressure at home for 1 week or longer. If you are diagnosed with hypertension, you may have other blood or imaging tests to help your health care provider understand your overall risk for other conditions. How is this treated? This condition is treated by making healthy lifestyle changes, such as eating healthy foods, exercising more, and reducing your alcohol intake. You may be referred for counseling on a healthy diet and physical activity. Your health care provider may prescribe medicine if lifestyle changes are not enough to get your blood pressure under control and if: Your systolic blood pressure is above 130. Your diastolic blood pressure is above 80. Your personal target blood pressure may vary depending on your medical conditions, your age, and other factors. Follow these instructions at home: Eating and drinking  Eat a diet that is high in fiber and potassium, and low in sodium, added sugar, and fat. An example of this eating plan is called the DASH diet. DASH stands for Dietary Approaches to Stop Hypertension. To eat this way: Eat   plenty of fresh fruits and vegetables. Try to fill one half of your plate at each meal with fruits and vegetables. Eat whole grains, such as whole-wheat pasta, brown rice, or whole-grain bread. Fill about one  fourth of your plate with whole grains. Eat or drink low-fat dairy products, such as skim milk or low-fat yogurt. Avoid fatty cuts of meat, processed or cured meats, and poultry with skin. Fill about one fourth of your plate with lean proteins, such as fish, chicken without skin, beans, eggs, or tofu. Avoid pre-made and processed foods. These tend to be higher in sodium, added sugar, and fat. Reduce your daily sodium intake. Many people with hypertension should eat less than 1,500 mg of sodium a day. Do not drink alcohol if: Your health care provider tells you not to drink. You are pregnant, may be pregnant, or are planning to become pregnant. If you drink alcohol: Limit how much you have to: 0-1 drink a day for women. 0-2 drinks a day for men. Know how much alcohol is in your drink. In the [place] one drink equals one 12 oz bottle of beer (355 mL), one 5 oz glass of wine (148 mL), or one 1 oz glass of hard liquor (44 mL). Lifestyle  Work with your health care provider to maintain a healthy body weight or to lose weight. Ask what an ideal weight is for you. Get at least 30 minutes of exercise that causes your heart to beat faster (aerobic exercise) most days of the week. Activities may include walking, swimming, or biking. Include exercise to strengthen your muscles (resistance exercise), such as Pilates or lifting weights, as part of your weekly exercise routine. Try to do these types of exercises for 30 minutes at least 3 days a week. Do not use any products that contain nicotine or tobacco. These products include cigarettes, chewing tobacco, and vaping devices, such as e-cigarettes. If you need help quitting, ask your health care provider. Monitor your blood pressure at home as told by your health care provider. Keep all follow-up visits. This is important. Medicines Take over-the-counter and prescription medicines only as told by your health care provider. Follow directions carefully. Blood  pressure medicines must be taken as prescribed. Do not skip doses of blood pressure medicine. Doing this puts you at risk for problems and can make the medicine less effective. Ask your health care provider about side effects or reactions to medicines that you should watch for. Contact a health care provider if you: Think you are having a reaction to a medicine you are taking. Have headaches that keep coming back (recurring). Feel dizzy. Have swelling in your ankles. Have trouble with your vision. Get help right away if you: Develop a severe headache or confusion. Have unusual weakness or numbness. Feel faint. Have severe pain in your chest or abdomen. Vomit repeatedly. Have trouble breathing. These symptoms may be an emergency. Get help right away. Call 911. Do not wait to see if the symptoms will go away. Do not drive yourself to the hospital. Summary Hypertension is when the force of blood pumping through your arteries is too strong. If this condition is not controlled, it may put you at risk for serious complications. Your personal target blood pressure may vary depending on your medical conditions, your age, and other factors. For most people, a normal blood pressure is less than 120/80. Hypertension is treated with lifestyle changes, medicines, or a combination of both. Lifestyle changes include losing weight, eating a healthy,   low-sodium diet, exercising more, and limiting alcohol. This information is not intended to replace advice given to you by your health care provider. Make sure you discuss any questions you have with your health care provider. Document Revised: 05/10/2021 Document Reviewed: 05/10/2021 Elsevier Patient Education  2023 Elsevier Inc.  

## 2022-05-15 NOTE — Progress Notes (Signed)
I,Tianna Badgett,acting as a Education administrator for [employer], FNP.,have documented all relevant documentation on the behalf of Minette Brine, FNP,as directed by  Minette Brine, FNP while in the presence of Minette Brine, [place].  Subjective:     Patient ID: Mckenzie Anderson , female    DOB: August 30, 1970 , 51 y.o.   MRN: [id number]   Chief Complaint  Patient presents with   Hyperlipidemia    HPI  Patient presents today for HTN follow up. She is now taking Linzess 145 mg since her colonoscopy. She has had 2 colonoscopies this year. She is taking bentyl which helps with her cramping. She had a cyst removed from her left wrist on October 10th and is out of work for 6-8 weeks.   BP Readings from Last 3 Encounters: 05/15/22 : (!) 144/90 02/06/22 : 140/80 12/19/21 : 138/88    Hyperlipidemia This is a chronic problem. The current episode started more than 1 year ago. The problem is controlled. There are no known factors aggravating her hyperlipidemia. Pertinent negatives include no chest pain.     Past Medical History:  Diagnosis Date   Asthma    Gross hematuria    Heartburn    Hypertension    Migraines      Family History  Problem Relation Age of Onset   Hypertension Mother    Diabetes Mother    Gout Mother    Cancer Father    Kidney cancer Father    Seizures Maternal Aunt    Cancer Maternal Grandmother    Breast cancer Maternal Grandmother 27   Hypertension Maternal Grandfather    Obesity Maternal Grandfather    Diabetes Paternal Grandmother    Diabetes Paternal Grandfather    Cancer Other    Seizures Other        Grandmother's sister   Migraines Neg Hx    Bladder Cancer Neg Hx      Current Outpatient Medications:    Cholecalciferol (VITAMIN D3) 1.25 MG (50000 UT) CAPS, Take 50,000 Units by mouth once a week., Disp: , Rfl:    dicyclomine (BENTYL) 10 MG capsule, Take 1 capsule (10 mg total) by mouth 4 (four) times daily -  before meals and at bedtime., Disp: 30 capsule, Rfl: 2    ferrous sulfate 325 (65 FE) MG EC tablet, Take 1 tablet by mouth daily., Disp: , Rfl:    fluticasone (FLONASE) 50 MCG/ACT nasal spray, Place 1 spray into both nostrils daily as needed for allergies. , Disp: , Rfl:    LINZESS 145 MCG CAPS capsule, Take 145 mcg by mouth daily., Disp: , Rfl:    pantoprazole (PROTONIX) 40 MG tablet, Take 1 tablet (40 mg total) by mouth 2 (two) times daily., Disp: 28 tablet, Rfl: 0   tamsulosin (FLOMAX) 0.4 MG CAPS capsule, Take 1 capsule (0.4 mg total) by mouth daily., Disp: 14 capsule, Rfl: 0   losartan (COZAAR) 100 MG tablet, Take 1 tablet (100 mg total) by mouth daily., Disp: 90 tablet, Rfl: 1   metoprolol tartrate (LOPRESSOR) 25 MG tablet, Take 1 tablet (25 mg total) by mouth 2 (two) times daily., Disp: 90 tablet, Rfl: 1   spironolactone (ALDACTONE) 25 MG tablet, Take 1 tablet (25 mg total) by mouth daily., Disp: 90 tablet, Rfl: 1   Allergies  Allergen Reactions   Sulfa Antibiotics      Review of Systems  Constitutional: Negative.   Eyes: Negative.   Respiratory: Negative.    Cardiovascular: Negative.  Negative for chest pain.  Gastrointestinal: Negative.   Endocrine: Negative.   Genitourinary: Negative.   Neurological: Negative.   Psychiatric/Behavioral: Negative.       Today's Vitals   05/15/22 0933 05/15/22 1024  BP: (!) 144/90 (!) 142/88  Pulse: 91   Temp: 98.1 F (36.7 C)   TempSrc: Oral   Weight: 256 lb (116.1 kg)   Height: _0  (1.727 m)    Body mass index is 38.92 kg/m.  Wt Readings from Last 3 Encounters:  05/15/22 256 lb (116.1 kg)  02/06/22 249 lb 9.6 oz (113.2 kg)  08/12/21 251 lb (113.9 kg)    Objective:  Physical Exam Vitals reviewed.  Constitutional:      General: She is not in acute distress.    Appearance: Normal appearance. She is obese.  Cardiovascular:     Rate and Rhythm: Normal rate and regular rhythm.     Pulses: Normal pulses.     Heart sounds: Normal heart sounds. No murmur heard. Pulmonary:      Effort: Pulmonary effort is normal. No respiratory distress.     Breath sounds: Normal breath sounds. No wheezing.  Musculoskeletal:        General: No swelling or tenderness. Normal range of motion.  Skin:    General: Skin is warm and dry.     Capillary Refill: Capillary refill takes less than 2 seconds.  Neurological:     General: No focal deficit present.     Mental Status: She is alert and oriented to person, place, and time.     Cranial Nerves: No cranial nerve deficit.     Motor: No weakness.         Assessment And Plan:     1. Primary hypertension Comments: Blood pressure is elevated. She has not been taking all of her blood pressure medications, she is to restart except for amlodipine due to side effects - BMP8+EGFR - spironolactone (ALDACTONE) 25 MG tablet; Take 1 tablet (25 mg total) by mouth daily.  Dispense: 90 tablet; Refill: 1 - metoprolol tartrate (LOPRESSOR) 25 MG tablet; Take 1 tablet (25 mg total) by mouth 2 (two) times daily.  Dispense: 90 tablet; Refill: 1 - losartan (COZAAR) 100 MG tablet; Take 1 tablet (100 mg total) by mouth daily.  Dispense: 90 tablet; Refill: 1  2. Right upper quadrant abdominal pain Comments: This has been persistent and she would like to see another provider as she has not had any relief. Will refer to [facility] - Ambulatory referral to Gastroenterology  3. Class 2 severe obesity due to excess calories with serious comorbidity and body mass index (BMI) of 38.0 to 38.9 in adult [facility]) Chronic Discussed healthy diet and regular exercise options  Encouraged to exercise at least 150 minutes per week with 2 days of strength training  4. Influenza vaccination declined Patient declined influenza vaccination at this time. Patient is aware that influenza vaccine prevents illness in 70% of healthy people, and reduces hospitalizations to 30-70% in elderly. This vaccine is recommended annually. Education has been provided regarding the  importance of this vaccine but patient still declined. Advised may receive this vaccine at local pharmacy or Health Dept.or vaccine clinic. Aware to provide a copy of the vaccination record if obtained from local pharmacy or Health Dept.  Pt is willing to accept risk associated with refusing vaccination.  5. Herpes zoster vaccination declined Declines shingrix, educated on disease process and is aware if he changes his mind to notify office  6. COVID-19 vaccination declined  She is encouraged to strive for BMI less than 30 to decrease cardiac risk. Advised to aim for at least 150 minutes of exercise per week.    Patient was given opportunity to ask questions. Patient verbalized understanding of the plan and was able to repeat key elements of the plan. All questions were answered to their satisfaction.  Minette Brine, FNP    I, Minette Brine, FNP, have reviewed all documentation for this visit. The documentation on 05/15/22 for the exam, diagnosis, procedures, and orders are all accurate and complete.  IF YOU HAVE BEEN REFERRED TO A SPECIALIST, IT MAY TAKE 1-2 WEEKS TO SCHEDULE/PROCESS THE REFERRAL. IF YOU HAVE NOT HEARD FROM US/SPECIALIST IN TWO WEEKS, PLEASE GIVE [place] A CALL AT [phone].   THE PATIENT IS ENCOURAGED TO PRACTICE SOCIAL DISTANCING DUE TO THE COVID-19 PANDEMIC.

## 2022-05-18 DIAGNOSIS — M25632 Stiffness of left wrist, not elsewhere classified: Secondary | ICD-10-CM | POA: Diagnosis not present

## 2022-05-24 DIAGNOSIS — M25632 Stiffness of left wrist, not elsewhere classified: Secondary | ICD-10-CM | POA: Diagnosis not present

## 2022-06-01 DIAGNOSIS — M25632 Stiffness of left wrist, not elsewhere classified: Secondary | ICD-10-CM | POA: Diagnosis not present

## 2022-06-07 DIAGNOSIS — M25632 Stiffness of left wrist, not elsewhere classified: Secondary | ICD-10-CM | POA: Diagnosis not present

## 2022-06-14 DIAGNOSIS — K5909 Other constipation: Secondary | ICD-10-CM | POA: Diagnosis not present

## 2022-06-14 DIAGNOSIS — K581 Irritable bowel syndrome with constipation: Secondary | ICD-10-CM

## 2022-06-14 DIAGNOSIS — K219 Gastro-esophageal reflux disease without esophagitis: Secondary | ICD-10-CM | POA: Diagnosis not present

## 2022-06-14 HISTORY — DX: Irritable bowel syndrome with constipation: K58.1

## 2022-06-16 DIAGNOSIS — M25532 Pain in left wrist: Secondary | ICD-10-CM | POA: Diagnosis not present

## 2022-06-17 ENCOUNTER — Encounter (HOSPITAL_COMMUNITY): Payer: Self-pay

## 2022-06-17 ENCOUNTER — Emergency Department (HOSPITAL_COMMUNITY)
Admission: EM | Admit: 2022-06-17 | Discharge: 2022-06-18 | Disposition: A | Discharge status: Home / Self Care | Payer: BC Managed Care – PPO | Attending: Emergency Medicine | Admitting: Emergency Medicine

## 2022-06-17 ENCOUNTER — Other Ambulatory Visit: Payer: Self-pay

## 2022-06-17 DIAGNOSIS — I10 Essential (primary) hypertension: Secondary | ICD-10-CM | POA: Insufficient documentation

## 2022-06-17 DIAGNOSIS — R109 Unspecified abdominal pain: Secondary | ICD-10-CM | POA: Diagnosis not present

## 2022-06-17 DIAGNOSIS — J45909 Unspecified asthma, uncomplicated: Secondary | ICD-10-CM | POA: Insufficient documentation

## 2022-06-17 DIAGNOSIS — R1031 Right lower quadrant pain: Secondary | ICD-10-CM | POA: Diagnosis not present

## 2022-06-17 DIAGNOSIS — Z87891 Personal history of nicotine dependence: Secondary | ICD-10-CM | POA: Insufficient documentation

## 2022-06-17 LAB — CBC
HCT: 38.1 % (ref 36.0–46.0)
Hemoglobin: 13 g/dL (ref 12.0–15.0)
MCH: 30 pg (ref 26.0–34.0)
MCHC: 34.1 g/dL (ref 30.0–36.0)
MCV: 87.8 fL (ref 80.0–100.0)
Platelets: 283 10*3/uL (ref 150–400)
RBC: 4.34 MIL/uL (ref 3.87–5.11)
RDW: 13.2 % (ref 11.5–15.5)
WBC: 9.1 10*3/uL (ref 4.0–10.5)
nRBC: 0 % (ref 0.0–0.2)

## 2022-06-17 LAB — URINALYSIS, ROUTINE W REFLEX MICROSCOPIC
Bacteria, UA: NONE SEEN
Bilirubin Urine: NEGATIVE
Glucose, UA: NEGATIVE mg/dL
Hgb urine dipstick: NEGATIVE
Ketones, ur: NEGATIVE mg/dL
Nitrite: NEGATIVE
Protein, ur: NEGATIVE mg/dL
Specific Gravity, Urine: 1.024 (ref 1.005–1.030)
pH: 5 (ref 5.0–8.0)

## 2022-06-17 LAB — COMPREHENSIVE METABOLIC PANEL
ALT: 24 U/L (ref 0–44)
AST: 21 U/L (ref 15–41)
Albumin: 3.9 g/dL (ref 3.5–5.0)
Alkaline Phosphatase: 199 U/L — ABNORMAL HIGH (ref 38–126)
Anion gap: 9 (ref 5–15)
BUN: 16 mg/dL (ref 6–20)
CO2: 25 mmol/L (ref 22–32)
Calcium: 9.1 mg/dL (ref 8.9–10.3)
Chloride: 105 mmol/L (ref 98–111)
Creatinine, Ser: 0.91 mg/dL (ref 0.44–1.00)
GFR, Estimated: >60 mL/min (ref >60)
Glucose, Bld: 101 mg/dL — ABNORMAL HIGH (ref 70–99)
Potassium: 3.7 mmol/L (ref 3.5–5.1)
Sodium: 139 mmol/L (ref 135–145)
Total Bilirubin: 0.6 mg/dL (ref 0.3–1.2)
Total Protein: 7.2 g/dL (ref 6.5–8.1)

## 2022-06-17 LAB — LIPASE, BLOOD: Lipase: 33 U/L (ref 11–51)

## 2022-06-17 NOTE — ED Triage Notes (Signed)
Pt presents with RUQ abd pain x 2 week. Pt states the pain radiates around to the back. Pt denies N/V/D or constipation. Pt states she saw her PCP on Monday and was rx with doxepin and nortriptyline. Pt has taken medications without relief.

## 2022-06-18 ENCOUNTER — Emergency Department (HOSPITAL_COMMUNITY): Payer: BC Managed Care – PPO

## 2022-06-18 DIAGNOSIS — R109 Unspecified abdominal pain: Secondary | ICD-10-CM | POA: Diagnosis not present

## 2022-06-18 MED ORDER — POLYETHYLENE GLYCOL 3350 17 G PO PACK: 17.0000 g | PACK | Freq: Every day | ORAL | 1 refills | Status: DC

## 2022-06-18 MED ORDER — IOHEXOL 300 MG/ML  SOLN
100.0000 mL | Freq: Once | INTRAMUSCULAR | Status: AC | PRN
  Administered 2022-06-18: 100 mL via INTRAVENOUS

## 2022-06-18 MED ORDER — OXYCODONE-ACETAMINOPHEN 5-325 MG PO TABS: 1.0000 | ORAL_TABLET | Freq: Four times a day (QID) | ORAL | 0 refills | Status: DC | PRN

## 2022-06-18 NOTE — Discharge Instructions (Signed)
You were evaluated in the Emergency Department and after careful evaluation, we did not find any emergent condition requiring admission or further testing in the hospital.  Your exam/testing today is overall reassuring.  CT scan did not show any emergencies.  Use the MiraLAX up to 6 times daily as we discussed.  Continue taking your new medications, continue following up with gastroenterology.  Please return to the Emergency Department if you experience any worsening of your condition.   Thank you for allowing [place] to be a part of your care.

## 2022-06-18 NOTE — ED Provider Notes (Signed)
[facility]-EMERGENCY DEPT [facility] Emergency Department Provider Note MRN:  [id number]  Arrival date & time: 06/18/22     Chief Complaint   Abdominal Pain   History of Present Illness   Mckenzie Anderson is a 51 y.o. year-old female with a history of hypertension presenting to the ED with chief complaint of abdominal pain.  Right-sided abdominal pain on and off for about a year, no one can tell her why she hurts.  Flared up again 1 or 2 days ago, more severe than normal, more persistent and constant than normal.  Denies fever, no nausea vomiting or diarrhea, no vaginal bleeding or discharge.  Review of Systems  A thorough review of systems was obtained and all systems are negative except as noted in the HPI and PMH.   Patient's Health History    Past Medical History:  Diagnosis Date   Asthma    Gross hematuria    Heartburn    Hypertension    Migraines     Past Surgical History:  Procedure Laterality Date   ABDOMINAL HYSTERECTOMY     CARPAL TUNNEL RELEASE     CHOLECYSTECTOMY      Family History  Problem Relation Age of Onset   Hypertension Mother    Diabetes Mother    Gout Mother    Cancer Father    Kidney cancer Father    Seizures Maternal Aunt    Cancer Maternal Grandmother    Breast cancer Maternal Grandmother 78   Hypertension Maternal Grandfather    Obesity Maternal Grandfather    Diabetes Paternal Grandmother    Diabetes Paternal Grandfather    Cancer Other    Seizures Other        Grandmother's sister   Migraines Neg Hx    Bladder Cancer Neg Hx     Social History   Socioeconomic History   Marital status: Divorced    Spouse name: Not on file   Number of children: 3   Years of education: AA   Highest education level: Not on file  Occupational History   Occupation: Emergency planning/management officer tobacco packing  Tobacco Use   Smoking status: Former    Packs/day: 0.50    Types: Cigarettes    Quit date: 12/19/2021    Years since quitting: 0.4   Smokeless tobacco: Never    Tobacco comments:    She stopped since her last visit   Substance and Sexual Activity   Alcohol use: No   Drug use: No   Sexual activity: Not Currently    Birth control/protection: None  Other Topics Concern   Not on file  Social History Narrative   Lives with mother and 2 children    Caffeine use: 2-3 Mt Dew[employer] occasionally    Social Determinants of Health   Financial Resource Strain: Not on file  Food Insecurity: Not on file  Transportation Needs: Not on file  Physical Activity: Not on file  Stress: Not on file  Social Connections: Not on file  Intimate Partner Violence: Not on file     Physical Exam   Vitals:   06/17/22 2012  BP: (!) 185/108  Pulse: 86  Resp: 18  Temp: 99.2 F (37.3 C)  SpO2: 100%    CONSTITUTIONAL: Well-appearing, NAD NEURO/PSYCH:  Alert and oriented x 3, no focal deficits EYES:  eyes equal and reactive ENT/NECK:  no LAD, no JVD CARDIO: Regular rate, well-perfused, normal S1 and S2 PULM:  CTAB no wheezing or rhonchi GI/GU:  non-distended, non-tender MSK/SPINE:  No gross deformities, no edema SKIN:  no rash, atraumatic   *Additional and/or pertinent findings included in MDM below  Diagnostic and Interventional Summary    EKG Interpretation  Date/Time:    Ventricular Rate:    PR Interval:    QRS Duration:   QT Interval:    QTC Calculation:   R Axis:     Text Interpretation:         Labs Reviewed  COMPREHENSIVE METABOLIC PANEL - Abnormal; Notable for the following components:      Result Value   Glucose, Bld 101 (*)    Alkaline Phosphatase 199 (*)    All other components within normal limits  URINALYSIS, ROUTINE W REFLEX MICROSCOPIC - Abnormal; Notable for the following components:   Leukocytes,Ua TRACE (*)    All other components within normal limits  LIPASE, BLOOD  CBC    CT ABDOMEN PELVIS W CONTRAST  Final Result      Medications  iohexol (OMNIPAQUE) 300 MG/ML solution 100 mL (100 mLs Intravenous Contrast Given  06/18/22 0132)     Procedures  /  Critical Care Procedures  ED Course and Medical Decision Making  Initial Impression and Ddx Acute on chronic right lower quadrant pain of unclear cause, appendicitis is considered given the worsened nature of it during the past 2 days.  Oral temp 99.2.  Will need CT to exclude.  Past medical/surgical history that increases complexity of ED encounter: Chronic abdominal pain  Interpretation of Diagnostics I personally reviewed the laboratory assessment and my interpretation is as follows: No significant blood count or electrolyte disturbance  CT is without significant findings.  Patient Reassessment and Ultimate Disposition/Management     Will treat for possible constipation, patient may have IBS, she has been started on nortriptyline and other IBS medicines over the past week, hopeful these will start to work.  She will follow-up with GI.  Patient management required discussion with the following services or consulting groups:  None  Complexity of Problems Addressed Acute illness or injury that poses threat of life of bodily function  Additional Data Reviewed and Analyzed Further history obtained from: None  Additional Factors Impacting ED Encounter Risk Prescriptions  Elmer Sow. Pilar Plate, MD [facility] Emergency Medicine [facility] [email or web address]  Final Clinical Impressions(s) / ED Diagnoses     ICD-10-CM   1. RLQ abdominal pain  R10.31       ED Discharge Orders          Ordered    polyethylene glycol (MIRALAX) 17 g packet  Daily        06/18/22 0251    oxyCODONE-acetaminophen (PERCOCET/ROXICET) 5-325 MG tablet  Every 6 hours PRN        06/18/22 0251             Discharge Instructions Discussed with and Provided to Patient:     Discharge Instructions      You were evaluated in the Emergency Department and after careful evaluation, we did not find any emergent condition requiring admission or further  testing in the hospital.  Your exam/testing today is overall reassuring.  CT scan did not show any emergencies.  Use the MiraLAX up to 6 times daily as we discussed.  Continue taking your new medications, continue following up with gastroenterology.  Please return to the Emergency Department if you experience any worsening of your condition.   Thank you for allowing [place] to be a part of your care.  Sabas Sous, MD 06/18/22 [phone]

## 2022-06-19 DIAGNOSIS — R2232 Localized swelling, mass and lump, left upper limb: Secondary | ICD-10-CM | POA: Diagnosis not present

## 2022-06-19 DIAGNOSIS — M25632 Stiffness of left wrist, not elsewhere classified: Secondary | ICD-10-CM | POA: Diagnosis not present

## 2022-06-19 DIAGNOSIS — M7711 Lateral epicondylitis, right elbow: Secondary | ICD-10-CM | POA: Diagnosis not present

## 2022-06-19 DIAGNOSIS — Z4789 Encounter for other orthopedic aftercare: Secondary | ICD-10-CM | POA: Diagnosis not present

## 2022-06-19 DIAGNOSIS — M25521 Pain in right elbow: Secondary | ICD-10-CM | POA: Diagnosis not present

## 2022-06-19 DIAGNOSIS — M25532 Pain in left wrist: Secondary | ICD-10-CM | POA: Diagnosis not present

## 2022-06-20 ENCOUNTER — Telehealth: Payer: Self-pay

## 2022-06-20 MED FILL — Oxycodone w/ Acetaminophen Tab 5-325 MG: ORAL | Qty: 6 | Status: AC

## 2022-06-20 NOTE — Telephone Encounter (Signed)
Transition Care Management Follow-up Telephone Call Date of discharge and from where: 06/18/2022 [place]  How have you been since you were released from the hospital? Pt states she is still in pain.  Any questions or concerns? No  Items Reviewed: Did the pt receive and understand the discharge instructions provided? No  Medications obtained and verified? Yes  Other? No  Any new allergies since your discharge? No  Dietary orders reviewed? Yes Do you have support at home? Yes   Home Care and Equipment/Supplies: Were home health services ordered? no If so, what is the name of the agency? N/a  Has the agency set up a time to come to the patient's home? no Were any new equipment or medical supplies ordered?  No What is the name of the medical supply agency? N/a Were you able to get the supplies/equipment? no Do you have any questions related to the use of the equipment or supplies? No  Functional Questionnaire: (I = Independent and D = Dependent) ADLs: i  Bathing/Dressing- i  Meal Prep- i  Eating- i  Maintaining continence- i  Transferring/Ambulation- i  Managing Meds- i  Follow up appointments reviewed:  PCP Hospital f/u appt confirmed? Yes  Scheduled to see n/a on n/a @ n/a. Specialist Hospital f/u appt confirmed? Yes  Scheduled to see GI on patient reports seeing specialist last week. Are transportation arrangements needed? No  If their condition worsens, is the pt aware to call PCP or go to the Emergency Dept.? Yes Was the patient provided with contact information for the PCP's office or ED? Yes Was to pt encouraged to call back with questions or concerns? Yes

## 2022-06-28 DIAGNOSIS — M25632 Stiffness of left wrist, not elsewhere classified: Secondary | ICD-10-CM | POA: Diagnosis not present

## 2022-07-08 DIAGNOSIS — M25521 Pain in right elbow: Secondary | ICD-10-CM | POA: Diagnosis not present

## 2022-07-14 ENCOUNTER — Encounter: Payer: Self-pay | Admitting: Nurse Practitioner

## 2022-07-18 ENCOUNTER — Other Ambulatory Visit: Payer: Self-pay | Admitting: Nurse Practitioner

## 2022-07-18 MED ORDER — FLUCONAZOLE 100 MG PO TABS: 100.0000 mg | ORAL_TABLET | Freq: Every day | ORAL | 0 refills | Status: DC

## 2022-08-13 ENCOUNTER — Ambulatory Visit
Admission: EM | Admit: 2022-08-13 | Discharge: 2022-08-13 | Disposition: A | Discharge status: Home / Self Care | Payer: BC Managed Care – PPO | Attending: Family Medicine | Admitting: Family Medicine

## 2022-08-13 ENCOUNTER — Encounter: Payer: Self-pay | Admitting: Emergency Medicine

## 2022-08-13 ENCOUNTER — Other Ambulatory Visit: Payer: Self-pay

## 2022-08-13 DIAGNOSIS — Z1152 Encounter for screening for COVID-19: Secondary | ICD-10-CM | POA: Insufficient documentation

## 2022-08-13 DIAGNOSIS — Z20828 Contact with and (suspected) exposure to other viral communicable diseases: Secondary | ICD-10-CM | POA: Diagnosis not present

## 2022-08-13 DIAGNOSIS — J069 Acute upper respiratory infection, unspecified: Secondary | ICD-10-CM | POA: Insufficient documentation

## 2022-08-13 HISTORY — DX: Diverticulosis of intestine, part unspecified, without perforation or abscess without bleeding: K57.90

## 2022-08-13 MED ORDER — OSELTAMIVIR PHOSPHATE 75 MG PO CAPS: 75.0000 mg | ORAL_CAPSULE | Freq: Two times a day (BID) | ORAL | 0 refills | Status: DC

## 2022-08-13 MED ORDER — FLUTICASONE PROPIONATE 50 MCG/ACT NA SUSP: 1.0000 | Freq: Two times a day (BID) | NASAL | 2 refills | Status: DC

## 2022-08-13 NOTE — Discharge Instructions (Signed)
We are starting you on the antiviral medication for the flu as this is my suspicion for what is going on with you.  We have tested you for COVID just to make sure it is not actually this and if your COVID test is positive, go ahead and stop the Tamiflu and we can discuss COVID-specific medications.  Use Flonase nasal spray twice daily to help with your sinus congestion and ear pain in addition to Coricidin HBP, plain Mucinex, antihistamines.  Do saline sinus rinses, run humidifier, drink plenty of fluids

## 2022-08-13 NOTE — ED Provider Notes (Signed)
[facility]    CSN: [id number] Arrival date & time: 08/13/22  1259      History   Chief Complaint Chief Complaint  Patient presents with   Generalized Body Aches    HPI Mckenzie Anderson is a 52 y.o. female.   Patient presenting today with 2 to 3-day history of generalized body aches, fever, congestion, bilateral ear pressure, fatigue, headaches.  Denies cough, chest pain, shortness of breath, abdominal pain, nausea vomiting or diarrhea.  So far taking Mucinex with minimal relief.  States multiple exposures to flu recently at work.    Past Medical History:  Diagnosis Date   Asthma    Diverticulosis    Gross hematuria    Heartburn    Hypertension    Migraines     Patient Active Problem List   Diagnosis Date Noted   Abdominal pain 08/13/2021   Need for hepatitis B screening test 08/13/2021   Immune to hepatitis A 08/13/2021   Smoking 08/13/2021   Alkaline phosphatase elevation 08/13/2021   Abdominal bloating 03/17/2021   Peripheral neuritis 01/11/2018   Rupture of right distal biceps tendon 01/11/2018   Migraine 01/08/2016   Muscle cramp 01/08/2016   HTN (hypertension) 01/08/2016    Past Surgical History:  Procedure Laterality Date   ABDOMINAL HYSTERECTOMY     CARPAL TUNNEL RELEASE     CHOLECYSTECTOMY      OB History   No obstetric history on file.      Home Medications    Prior to Admission medications   Medication Sig Start Date End Date Taking? Authorizing Provider  fluticasone (FLONASE) 50 MCG/ACT nasal spray Place 1 spray into both nostrils 2 (two) times daily. 08/13/22  Yes Volney American, PA-C  nortriptyline (PAMELOR) 10 MG capsule Take 10 mg by mouth at bedtime.   Yes [provider]  oseltamivir (TAMIFLU) 75 MG capsule Take 1 capsule (75 mg total) by mouth every 12 (twelve) hours. 08/13/22  Yes Volney American, PA-C  Cholecalciferol (VITAMIN D3) 1.25 MG (50000 UT) CAPS Take 50,000 Units by mouth once a week.  11/28/19   [provider]  dicyclomine (BENTYL) 10 MG capsule Take 1 capsule (10 mg total) by mouth 4 (four) times daily -  before meals and at bedtime. 02/06/22   Minette Brine, FNP  ferrous sulfate 325 (65 FE) MG EC tablet Take 1 tablet by mouth daily. 07/14/21   [provider]  fluconazole (DIFLUCAN) 100 MG tablet Take 1 tablet (100 mg total) by mouth daily. Take 1 tablet by mouth now repeat in 5 days 07/18/22   Minette Brine, FNP  fluticasone [facility]) 50 MCG/ACT nasal spray Place 1 spray into both nostrils daily as needed for allergies.  10/07/19   [provider]  LINZESS 145 MCG CAPS capsule Take 145 mcg by mouth daily. As needed 07/27/21   [provider]  losartan (COZAAR) 100 MG tablet Take 1 tablet (100 mg total) by mouth daily. 05/15/22   Minette Brine, FNP  metoprolol tartrate (LOPRESSOR) 25 MG tablet Take 1 tablet (25 mg total) by mouth 2 (two) times daily. 05/15/22   Minette Brine, FNP  oxyCODONE-acetaminophen (PERCOCET/ROXICET) 5-325 MG tablet Take 1 tablet by mouth every 6 (six) hours as needed for severe pain. 06/18/22   Maudie Flakes, MD  pantoprazole (PROTONIX) 40 MG tablet Take 1 tablet (40 mg total) by mouth 2 (two) times daily. 02/13/22   Minette Brine, FNP  polyethylene glycol (MIRALAX) 17 g packet Take  17 g by mouth daily. 06/18/22   Maudie Flakes, MD  spironolactone (ALDACTONE) 25 MG tablet Take 1 tablet (25 mg total) by mouth daily. 05/15/22   Minette Brine, FNP  tamsulosin (FLOMAX) 0.4 MG CAPS capsule Take 1 capsule (0.4 mg total) by mouth daily. 12/02/21   Volney American, PA-C    Family History Family History  Problem Relation Age of Onset   Hypertension Mother    Diabetes Mother    Gout Mother    Cancer Father    Kidney cancer Father    Seizures Maternal Aunt    Cancer Maternal Grandmother    Breast cancer Maternal Grandmother 20   Hypertension Maternal Grandfather    Obesity Maternal Grandfather    Diabetes Paternal  Grandmother    Diabetes Paternal Grandfather    Cancer Other    Seizures Other        Grandmother's sister   Migraines Neg Hx    Bladder Cancer Neg Hx     Social History Social History   Tobacco Use   Smoking status: Former    Packs/day: 0.50    Types: Cigarettes    Quit date: 12/19/2021    Years since quitting: 0.6   Smokeless tobacco: Never   Tobacco comments:    She stopped since her last visit   Substance Use Topics   Alcohol use: No   Drug use: No     Allergies   Sulfa antibiotics   Review of Systems Review of Systems Per HPI  Physical Exam Triage Vital Signs ED Triage Vitals  Enc Vitals Group     BP 08/13/22 1413 (!) 150/98     Pulse Rate 08/13/22 1413 98     Resp 08/13/22 1413 20     Temp 08/13/22 1413 98.7 F (37.1 C)     Temp Source 08/13/22 1413 Oral     SpO2 08/13/22 1413 94 %     Weight --      Height --      Head Circumference --      Peak Flow --      Pain Score 08/13/22 1409 9     Pain Loc --      Pain Edu? --      Excl. in [place]? --    No data found.  Updated Vital Signs BP (!) 150/98 (BP Location: Right Arm)   Pulse 98   Temp 98.7 F (37.1 C) (Oral)   Resp 20   SpO2 94%   Visual Acuity Right Eye Distance:   Left Eye Distance:   Bilateral Distance:    Right Eye Near:   Left Eye Near:    Bilateral Near:     Physical Exam Vitals and nursing note reviewed.  Constitutional:      Appearance: Normal appearance.  HENT:     Head: Atraumatic.     Right Ear: Tympanic membrane and external ear normal.     Left Ear: External ear normal.     Ears:     Comments: Left middle ear effusion    Nose: Rhinorrhea present.     Mouth/Throat:     Mouth: Mucous membranes are moist.     Pharynx: Posterior oropharyngeal erythema present.  Eyes:     Extraocular Movements: Extraocular movements intact.     Conjunctiva/sclera: Conjunctivae normal.  Cardiovascular:     Rate and Rhythm: Normal rate and regular rhythm.     Heart sounds: Normal  heart sounds.  Pulmonary:  Effort: Pulmonary effort is normal.     Breath sounds: Normal breath sounds. No wheezing or rales.  Musculoskeletal:        General: Normal range of motion.     Cervical back: Normal range of motion and neck supple.  Skin:    General: Skin is warm and dry.  Neurological:     Mental Status: She is alert and oriented to person, place, and time.  Psychiatric:        Mood and Affect: Mood normal.        Thought Content: Thought content normal.      UC Treatments / Results  Labs (all labs ordered are listed, but only abnormal results are displayed) Labs Reviewed  SARS CORONAVIRUS 2 (TAT 6-24 HRS)    EKG   Radiology No results found.  Procedures Procedures (including critical care time)  Medications Ordered in UC Medications - No data to display  Initial Impression / Assessment and Plan / UC Course  I have reviewed the triage vital signs and the nursing notes.  Pertinent labs & imaging results that were available during my care of the patient were reviewed by me and considered in my medical decision making (see chart for details).     Mildly hypertensive in triage, otherwise vital signs reassuring.  Exam suggestive of a viral upper respiratory infection.  Suspect influenza based on work exposures and symptoms, however unable to test for this today due to backorder on testing supplies.  COVID test pending for further rule out, treat with Tamiflu, Flonase, Coricidin HBP, Mucinex, antihistamines, sinus rinses and other supportive measures.  Return for worsening symptoms.  Work note given.  Final Clinical Impressions(s) / UC Diagnoses   Final diagnoses:  Viral URI  Exposure to influenza     Discharge Instructions      We are starting you on the antiviral medication for the flu as this is my suspicion for what is going on with you.  We have tested you for COVID just to make sure it is not actually this and if your COVID test is positive, go  ahead and stop the Tamiflu and we can discuss COVID-specific medications.  Use Flonase nasal spray twice daily to help with your sinus congestion and ear pain in addition to Coricidin HBP, plain Mucinex, antihistamines.  Do saline sinus rinses, run humidifier, drink plenty of fluids    ED Prescriptions     Medication Sig Dispense Auth. Provider   oseltamivir (TAMIFLU) 75 MG capsule Take 1 capsule (75 mg total) by mouth every 12 (twelve) hours. 10 capsule Particia Nearing, PA-C   fluticasone [facility]) 50 MCG/ACT nasal spray Place 1 spray into both nostrils 2 (two) times daily. 16 g Particia Nearing, [place]      PDMP not reviewed this encounter.   Particia Nearing, [place] 08/13/22 1435

## 2022-08-13 NOTE — ED Triage Notes (Addendum)
Pt reports generalized body aches, nasal congestion, bilateral ear pressure,intermittent fever,fatigue. Pt reports has been taking otc medication with minimal change in symptoms.   Several flu exposures at work.

## 2022-08-14 LAB — SARS CORONAVIRUS 2 (TAT 6-24 HRS): SARS Coronavirus 2: NEGATIVE

## 2022-09-04 DIAGNOSIS — Z86010 Personal history of colonic polyps: Secondary | ICD-10-CM | POA: Diagnosis not present

## 2022-09-04 DIAGNOSIS — K5900 Constipation, unspecified: Secondary | ICD-10-CM | POA: Diagnosis not present

## 2022-09-04 DIAGNOSIS — R109 Unspecified abdominal pain: Secondary | ICD-10-CM | POA: Diagnosis not present

## 2022-09-04 DIAGNOSIS — K5909 Other constipation: Secondary | ICD-10-CM | POA: Diagnosis not present

## 2022-09-04 DIAGNOSIS — K581 Irritable bowel syndrome with constipation: Secondary | ICD-10-CM | POA: Diagnosis not present

## 2022-09-04 DIAGNOSIS — K219 Gastro-esophageal reflux disease without esophagitis: Secondary | ICD-10-CM | POA: Diagnosis not present

## 2022-09-04 DIAGNOSIS — R1084 Generalized abdominal pain: Secondary | ICD-10-CM | POA: Diagnosis not present

## 2022-09-12 ENCOUNTER — Emergency Department (HOSPITAL_COMMUNITY): Payer: BC Managed Care – PPO

## 2022-09-12 ENCOUNTER — Encounter: Payer: Self-pay | Admitting: Pharmacist

## 2022-09-12 ENCOUNTER — Encounter (HOSPITAL_COMMUNITY): Payer: Self-pay | Admitting: Emergency Medicine

## 2022-09-12 ENCOUNTER — Emergency Department (HOSPITAL_COMMUNITY)
Admission: EM | Admit: 2022-09-12 | Discharge: 2022-09-12 | Disposition: A | Discharge status: Home / Self Care | Payer: BC Managed Care – PPO | Attending: Emergency Medicine | Admitting: Emergency Medicine

## 2022-09-12 ENCOUNTER — Other Ambulatory Visit: Payer: Self-pay

## 2022-09-12 DIAGNOSIS — K529 Noninfective gastroenteritis and colitis, unspecified: Secondary | ICD-10-CM

## 2022-09-12 DIAGNOSIS — Z20822 Contact with and (suspected) exposure to covid-19: Secondary | ICD-10-CM | POA: Diagnosis not present

## 2022-09-12 DIAGNOSIS — R197 Diarrhea, unspecified: Secondary | ICD-10-CM | POA: Diagnosis not present

## 2022-09-12 DIAGNOSIS — Z79899 Other long term (current) drug therapy: Secondary | ICD-10-CM | POA: Diagnosis not present

## 2022-09-12 DIAGNOSIS — R000 Tachycardia, unspecified: Secondary | ICD-10-CM | POA: Insufficient documentation

## 2022-09-12 DIAGNOSIS — R1011 Right upper quadrant pain: Secondary | ICD-10-CM | POA: Diagnosis not present

## 2022-09-12 DIAGNOSIS — E876 Hypokalemia: Secondary | ICD-10-CM | POA: Insufficient documentation

## 2022-09-12 DIAGNOSIS — G8929 Other chronic pain: Secondary | ICD-10-CM

## 2022-09-12 DIAGNOSIS — R1110 Vomiting, unspecified: Secondary | ICD-10-CM | POA: Diagnosis not present

## 2022-09-12 LAB — CBC WITH DIFFERENTIAL/PLATELET
Abs Immature Granulocytes: 0.02 10*3/uL (ref 0.00–0.07)
Basophils Absolute: 0 10*3/uL (ref 0.0–0.1)
Basophils Relative: 0 %
Eosinophils Absolute: 0 10*3/uL (ref 0.0–0.5)
Eosinophils Relative: 0 %
HCT: 40.9 % (ref 36.0–46.0)
Hemoglobin: 13.9 g/dL (ref 12.0–15.0)
Immature Granulocytes: 0 %
Lymphocytes Relative: 12 %
Lymphs Abs: 0.7 10*3/uL (ref 0.7–4.0)
MCH: 29 pg (ref 26.0–34.0)
MCHC: 34 g/dL (ref 30.0–36.0)
MCV: 85.4 fL (ref 80.0–100.0)
Monocytes Absolute: 0.4 10*3/uL (ref 0.1–1.0)
Monocytes Relative: 6 %
Neutro Abs: 5.2 10*3/uL (ref 1.7–7.7)
Neutrophils Relative %: 82 %
Platelets: 263 10*3/uL (ref 150–400)
RBC: 4.79 MIL/uL (ref 3.87–5.11)
RDW: 12.6 % (ref 11.5–15.5)
WBC: 6.4 10*3/uL (ref 4.0–10.5)
nRBC: 0 % (ref 0.0–0.2)

## 2022-09-12 LAB — COMPREHENSIVE METABOLIC PANEL
ALT: 31 U/L (ref 0–44)
AST: 27 U/L (ref 15–41)
Albumin: 3.8 g/dL (ref 3.5–5.0)
Alkaline Phosphatase: 225 U/L — ABNORMAL HIGH (ref 38–126)
Anion gap: 8 (ref 5–15)
BUN: 13 mg/dL (ref 6–20)
CO2: 23 mmol/L (ref 22–32)
Calcium: 8.8 mg/dL — ABNORMAL LOW (ref 8.9–10.3)
Chloride: 98 mmol/L (ref 98–111)
Creatinine, Ser: 0.91 mg/dL (ref 0.44–1.00)
GFR, Estimated: >60 mL/min (ref >60)
Glucose, Bld: 126 mg/dL — ABNORMAL HIGH (ref 70–99)
Potassium: 3.3 mmol/L — ABNORMAL LOW (ref 3.5–5.1)
Sodium: 129 mmol/L — ABNORMAL LOW (ref 135–145)
Total Bilirubin: 0.4 mg/dL (ref 0.3–1.2)
Total Protein: 7.7 g/dL (ref 6.5–8.1)

## 2022-09-12 LAB — RESP PANEL BY RT-PCR (RSV, FLU A&B, COVID)  RVPGX2
Influenza A by PCR: NEGATIVE
Influenza B by PCR: NEGATIVE
Resp Syncytial Virus by PCR: NEGATIVE
SARS Coronavirus 2 by RT PCR: NEGATIVE

## 2022-09-12 LAB — LIPASE, BLOOD: Lipase: 26 U/L (ref 11–51)

## 2022-09-12 MED ORDER — LACTATED RINGERS IV BOLUS
1000.0000 mL | Freq: Once | INTRAVENOUS | Status: AC
  Administered 2022-09-12: 1000 mL via INTRAVENOUS

## 2022-09-12 MED ORDER — HYDROMORPHONE HCL 1 MG/ML IJ SOLN
1.0000 mg | Freq: Once | INTRAMUSCULAR | Status: AC
  Administered 2022-09-12: 1 mg via INTRAVENOUS
  Filled 2022-09-12: qty 1

## 2022-09-12 MED ORDER — ONDANSETRON 4 MG PO TBDP: 4.0000 mg | ORAL_TABLET | Freq: Three times a day (TID) | ORAL | 0 refills | Status: DC | PRN

## 2022-09-12 MED ORDER — METOCLOPRAMIDE HCL 5 MG/ML IJ SOLN
10.0000 mg | Freq: Once | INTRAMUSCULAR | Status: AC
  Administered 2022-09-12: 10 mg via INTRAVENOUS
  Filled 2022-09-12: qty 2

## 2022-09-12 MED ORDER — ACETAMINOPHEN 500 MG PO TABS
1000.0000 mg | ORAL_TABLET | Freq: Once | ORAL | Status: AC
  Administered 2022-09-12: 1000 mg via ORAL
  Filled 2022-09-12: qty 2

## 2022-09-12 MED ORDER — POTASSIUM CHLORIDE CRYS ER 20 MEQ PO TBCR
20.0000 meq | EXTENDED_RELEASE_TABLET | Freq: Once | ORAL | Status: AC
  Administered 2022-09-12: 20 meq via ORAL
  Filled 2022-09-12: qty 1

## 2022-09-12 NOTE — ED Notes (Addendum)
Patient transported to X-ray 

## 2022-09-12 NOTE — ED Notes (Signed)
ED Provider at bedside. Pt resting quietly. Will discharge home.

## 2022-09-12 NOTE — ED Triage Notes (Signed)
Pt c/o emesis, diarrhea and abd pain since 3am.

## 2022-09-12 NOTE — Discharge Instructions (Signed)
If you develop worsening, continued, or recurrent abdominal pain, uncontrolled vomiting, fever, chest or back pain, or any other new/concerning symptoms then return to the ER for evaluation.  

## 2022-09-12 NOTE — ED Notes (Signed)
ED Provider at bedside. 

## 2022-09-12 NOTE — ED Provider Notes (Signed)
[facility]   CSN: [id number] Arrival date & time: 09/12/22  1909     History  Chief Complaint  Patient presents with   Abdominal Pain    Mckenzie Anderson is a 52 y.o. female.  HPI 52 year old female with a history of chronic right-sided abdominal pain presents with abdominal pain but also vomiting and diarrhea.  Symptoms all started this morning.  She did not know about a fever at home but is 100.7 here.  She states she is unable to keep anything down.  The pain is primarily right upper quadrant which she states is where it always is but is more severe today.  No new back pain.  No urinary symptoms.  No blood in the stool.  She has had numerous episodes of diarrhea and a couple episodes of vomiting.  No cough, sore throat, chest pain. Has had a previous cholecystectomy.  Home Medications Prior to Admission medications   Medication Sig Start Date End Date Taking? Authorizing Provider  ondansetron (ZOFRAN-ODT) 4 MG disintegrating tablet Take 1 tablet (4 mg total) by mouth every 8 (eight) hours as needed for nausea or vomiting. 09/12/22  Yes Sherwood Gambler, MD  Cholecalciferol (VITAMIN D3) 1.25 MG (50000 UT) CAPS Take 50,000 Units by mouth once a week. 11/28/19   [provider]  dicyclomine (BENTYL) 10 MG capsule Take 1 capsule (10 mg total) by mouth 4 (four) times daily -  before meals and at bedtime. 02/06/22   Minette Brine, FNP  ferrous sulfate 325 (65 FE) MG EC tablet Take 1 tablet by mouth daily. 07/14/21   [provider]  fluconazole (DIFLUCAN) 100 MG tablet Take 1 tablet (100 mg total) by mouth daily. Take 1 tablet by mouth now repeat in 5 days 07/18/22   Minette Brine, FNP  fluticasone [facility]) 50 MCG/ACT nasal spray Place 1 spray into both nostrils daily as needed for allergies.  10/07/19   [provider]  fluticasone (FLONASE) 50 MCG/ACT nasal spray Place 1 spray into both nostrils 2 (two) times  daily. 08/13/22   Volney American, PA-C  LINZESS 145 MCG CAPS capsule Take 145 mcg by mouth daily. As needed 07/27/21   [provider]  losartan (COZAAR) 100 MG tablet Take 1 tablet (100 mg total) by mouth daily. 05/15/22   Minette Brine, FNP  metoprolol tartrate (LOPRESSOR) 25 MG tablet Take 1 tablet (25 mg total) by mouth 2 (two) times daily. 05/15/22   Minette Brine, FNP  nortriptyline (PAMELOR) 10 MG capsule Take 10 mg by mouth at bedtime.    [provider]  oseltamivir (TAMIFLU) 75 MG capsule Take 1 capsule (75 mg total) by mouth every 12 (twelve) hours. 08/13/22   Volney American, PA-C  oxyCODONE-acetaminophen (PERCOCET/ROXICET) 5-325 MG tablet Take 1 tablet by mouth every 6 (six) hours as needed for severe pain. 06/18/22   Maudie Flakes, MD  pantoprazole (PROTONIX) 40 MG tablet Take 1 tablet (40 mg total) by mouth 2 (two) times daily. 02/13/22   Minette Brine, FNP  polyethylene glycol (MIRALAX) 17 g packet Take 17 g by mouth daily. 06/18/22   Maudie Flakes, MD  spironolactone (ALDACTONE) 25 MG tablet Take 1 tablet (25 mg total) by mouth daily. 05/15/22   Minette Brine, FNP  tamsulosin (FLOMAX) 0.4 MG CAPS capsule Take 1 capsule (0.4 mg total) by mouth daily. 12/02/21   Volney American, PA-C      Allergies  Sulfa antibiotics    Review of Systems   Review of Systems  Constitutional:  Negative for fever.  Respiratory:  Negative for cough and shortness of breath.   Cardiovascular:  Negative for chest pain.  Gastrointestinal:  Positive for abdominal pain, diarrhea and vomiting.    Physical Exam Updated Vital Signs BP 128/85 (BP Location: Left Arm)   Pulse 96   Temp 99 F (37.2 C) (Oral)   Resp 18   Ht '5\' 8"'$  (1.727 m)   Wt 115.7 kg   SpO2 96%   BMI 38.77 kg/m  Physical Exam Vitals and nursing note reviewed.  Constitutional:      Appearance: She is well-developed. She is obese.  HENT:     Head: Normocephalic and atraumatic.   Cardiovascular:     Rate and Rhythm: Regular rhythm. Tachycardia present.     Heart sounds: Normal heart sounds.  Pulmonary:     Effort: Pulmonary effort is normal.     Breath sounds: Normal breath sounds.  Abdominal:     Palpations: Abdomen is soft.     Tenderness: There is abdominal tenderness in the right upper quadrant.  Skin:    General: Skin is warm and dry.  Neurological:     Mental Status: She is alert.     ED Results / Procedures / Treatments   Labs (all labs ordered are listed, but only abnormal results are displayed) Labs Reviewed  COMPREHENSIVE METABOLIC PANEL - Abnormal; Notable for the following components:      Result Value   Sodium 129 (*)    Potassium 3.3 (*)    Glucose, Bld 126 (*)    Calcium 8.8 (*)    Alkaline Phosphatase 225 (*)    All other components within normal limits  RESP PANEL BY RT-PCR (RSV, FLU A&B, COVID)  RVPGX2  LIPASE, BLOOD  CBC WITH DIFFERENTIAL/PLATELET    EKG None  Radiology DG Abdomen Acute W/Chest  Result Date: 09/12/2022 CLINICAL DATA:  Vomiting, diarrhea and abdominal pain. EXAM: DG ABDOMEN ACUTE WITH 1 VIEW CHEST COMPARISON:  February 08, 2010 FINDINGS: There is no evidence of dilated bowel loops or free intraperitoneal air. Radiopaque surgical clips are seen within the right upper quadrant. No radiopaque calculi or other significant radiographic abnormality is seen. Heart size and mediastinal contours are within normal limits. Both lungs are clear. Multilevel degenerative changes are seen throughout the thoracic spine with mild dextroscoliosis. IMPRESSION: Negative abdominal radiographs.  No acute cardiopulmonary disease. Electronically Signed   By: Virgina Norfolk M.D.   On: 09/12/2022 20:06    Procedures Procedures    Medications Ordered in ED Medications  acetaminophen (TYLENOL) tablet 1,000 mg (1,000 mg Oral Given 09/12/22 2054)  lactated ringers bolus 1,000 mL (0 mLs Intravenous Stopped 09/12/22 2159)  HYDROmorphone  (DILAUDID) injection 1 mg (1 mg Intravenous Given 09/12/22 2021)  metoCLOPramide (REGLAN) injection 10 mg (10 mg Intravenous Given 09/12/22 2019)    ED Course/ Medical Decision Making/ A&P                             Medical Decision Making Amount and/or Complexity of Data Reviewed Labs: ordered.    Details: Mild hypokalemia. Normal WBC. Normal covid/flu. Radiology: ordered and independent interpretation performed.    Details: No SBO.  Risk OTC drugs. Prescription drug management.   Patient presents with exacerbation of chronic abdominal pain in the setting of what sounds like acute gastroenteritis.  She does have  right upper quadrant abdominal tenderness but has had multiple CTs and ultrasounds for this, I reviewed some of these results, no previous significant findings to account for her pain. VS are now normal after tylenol and symptomatic treatment.  Discussed options with patient, given this is likely gastroenteritis and her chronic abdominal pain history, I do not think a CT will show [place] anything as I have lower suspicion for emergent abdominal pathology.  She agrees and we will hold off on CT.  Will discharge home with supportive care.  No recent antibiotics or travel to suggest that she has C. difficile.        Final Clinical Impression(s) / ED Diagnoses Final diagnoses:  Acute gastroenteritis  Chronic abdominal pain    Rx / DC Orders ED Discharge Orders          Ordered    ondansetron (ZOFRAN-ODT) 4 MG disintegrating tablet  Every 8 hours PRN        09/12/22 2202              Sherwood Gambler, MD 09/12/22 2210

## 2022-09-13 ENCOUNTER — Telehealth: Payer: Self-pay

## 2022-09-13 NOTE — Transitions of Care (Post Inpatient/ED Visit) (Signed)
   09/13/2022  Name: DANIYAH CRAGLE MRN: [id number] DOB: 03-04-1971  Today's TOC FU Call Status: Today's TOC FU Call Status:: Unsuccessul Call (1st Attempt) Unsuccessful Call (1st Attempt) Date: 09/13/22  Attempted to reach the patient regarding the most recent Inpatient/ED visit.  Follow Up Plan: Additional outreach attempts will be made to reach the patient to complete the Transitions of Care (Post Inpatient/ED visit) call.   Signature Tami Lin

## 2022-09-14 ENCOUNTER — Encounter: Payer: Self-pay | Admitting: Radiology

## 2022-09-27 ENCOUNTER — Encounter: Payer: BC Managed Care – PPO | Admitting: Nurse Practitioner

## 2022-09-29 ENCOUNTER — Encounter: Payer: BC Managed Care – PPO | Admitting: Nurse Practitioner

## 2022-10-17 ENCOUNTER — Encounter: Payer: Self-pay | Admitting: Pharmacist

## 2022-10-26 ENCOUNTER — Encounter: Payer: Self-pay | Admitting: Nurse Practitioner

## 2022-10-26 ENCOUNTER — Ambulatory Visit (INDEPENDENT_AMBULATORY_CARE_PROVIDER_SITE_OTHER): Payer: BC Managed Care – PPO | Admitting: Nurse Practitioner

## 2022-10-26 VITALS — BP 120/100 | HR 80 | Temp 98.6°F | Ht 68.0 in | Wt 254.6 lb

## 2022-10-26 DIAGNOSIS — Z1231 Encounter for screening mammogram for malignant neoplasm of breast: Secondary | ICD-10-CM

## 2022-10-26 DIAGNOSIS — I10 Essential (primary) hypertension: Secondary | ICD-10-CM

## 2022-10-26 DIAGNOSIS — Z0001 Encounter for general adult medical examination with abnormal findings: Secondary | ICD-10-CM

## 2022-10-26 DIAGNOSIS — Z2821 Immunization not carried out because of patient refusal: Secondary | ICD-10-CM | POA: Diagnosis not present

## 2022-10-26 DIAGNOSIS — Z13220 Encounter for screening for lipoid disorders: Secondary | ICD-10-CM | POA: Diagnosis not present

## 2022-10-26 DIAGNOSIS — Z6838 Body mass index (BMI) 38.0-38.9, adult: Secondary | ICD-10-CM | POA: Diagnosis not present

## 2022-10-26 DIAGNOSIS — Z79899 Other long term (current) drug therapy: Secondary | ICD-10-CM | POA: Diagnosis not present

## 2022-10-26 DIAGNOSIS — Z0000 Encounter for general adult medical examination without abnormal findings: Secondary | ICD-10-CM

## 2022-10-26 LAB — POCT URINALYSIS DIPSTICK
Bilirubin, UA: NEGATIVE
Glucose, UA: NEGATIVE
Ketones, UA: NEGATIVE
Leukocytes, UA: NEGATIVE
Nitrite, UA: NEGATIVE
Protein, UA: NEGATIVE
Spec Grav, UA: 1.02 (ref 1.010–1.025)
Urobilinogen, UA: 0.2 E.U./dL
pH, UA: 5.5 (ref 5.0–8.0)

## 2022-10-26 MED ORDER — METOPROLOL TARTRATE 25 MG PO TABS: 25.0000 mg | ORAL_TABLET | Freq: Two times a day (BID) | ORAL | 1 refills | Status: DC

## 2022-10-26 MED ORDER — SPIRONOLACTONE 25 MG PO TABS: 25.0000 mg | ORAL_TABLET | Freq: Every day | ORAL | 1 refills | Status: AC

## 2022-10-26 MED ORDER — LOSARTAN POTASSIUM 100 MG PO TABS: 100.0000 mg | ORAL_TABLET | Freq: Every day | ORAL | 1 refills | Status: DC

## 2022-10-26 MED ORDER — ZEPBOUND 2.5 MG/0.5ML ~~LOC~~ SOAJ: 2.5000 mg | SUBCUTANEOUS | 0 refills | Status: DC

## 2022-10-26 NOTE — Patient Instructions (Signed)

## 2022-10-26 NOTE — Progress Notes (Signed)
I,Sindy Mccune,acting as a Neurosurgeonscribe for [name], FNP.,have documented all relevant documentation on the behalf of [name], FNP,as directed by  [name], FNP while in the presence of [name], FNP.   Subjective:     Patient ID: Mckenzie Anderson , female    DOB: 1971/05/23 , 52 y.o.   MRN: [id number]   Chief Complaint  Patient presents with   Annual Exam    HPI  Patient presents today for HM.   Patient states compliance with medications and has no other concerns today.   Wt Readings from Last 3 Encounters: 10/26/22 : 254 lb 9.6 oz (115.5 kg) 09/12/22 : 255 lb (115.7 kg) 06/17/22 : 255 lb (115.7 kg)       Past Medical History:  Diagnosis Date   Asthma    Diverticulosis    Gross hematuria    Heartburn    Hypertension    Migraines      Family History  Problem Relation Age of Onset   Hypertension Mother    Diabetes Mother    Gout Mother    Cancer Father    Kidney cancer Father    Seizures Maternal Aunt    Cancer Maternal Grandmother    Breast cancer Maternal Grandmother [id number]   Hypertension Maternal Grandfather    Obesity Maternal Grandfather    Diabetes Paternal Grandmother    Diabetes Paternal Grandfather    Cancer Other    Seizures Other        Grandmother's sister   Migraines Neg Hx    Bladder Cancer Neg Hx      Current Outpatient Medications:    Cholecalciferol (VITAMIN D3) 1.25 MG (50000 UT) CAPS, Take 50,000 Units by mouth once a week., Disp: , Rfl:    dicyclomine (BENTYL) 10 MG capsule, Take 1 capsule (10 mg total) by mouth 4 (four) times daily -  before meals and at bedtime., Disp: 30 capsule, Rfl: 2   ferrous sulfate 325 (65 FE) MG EC tablet, Take 1 tablet by mouth daily., Disp: , Rfl:    fluticasone (FLONASE) 50 MCG/ACT nasal spray, Place 1 spray into both nostrils daily as needed for allergies. , Disp: , Rfl:    lubiprostone (AMITIZA) 24 MCG capsule, Take 24 mcg by mouth 2 (two) times daily., Disp: , Rfl:    ondansetron (ZOFRAN-ODT)  4 MG disintegrating tablet, Take 1 tablet (4 mg total) by mouth every 8 (eight) hours as needed for nausea or vomiting., Disp: 10 tablet, Rfl: 0   pantoprazole (PROTONIX) 40 MG tablet, Take 1 tablet (40 mg total) by mouth 2 (two) times daily., Disp: 28 tablet, Rfl: 0   polyethylene glycol (MIRALAX) 17 g packet, Take 17 g by mouth daily., Disp: 30 each, Rfl: 1   tamsulosin (FLOMAX) 0.4 MG CAPS capsule, Take 1 capsule (0.4 mg total) by mouth daily., Disp: 14 capsule, Rfl: 0   tirzepatide (ZEPBOUND) 2.5 MG/0.5ML Pen, Inject 2.5 mg into the skin once a week., Disp: 2 mL, Rfl: 0   losartan (COZAAR) 100 MG tablet, Take 1 tablet (100 mg total) by mouth daily., Disp: 90 tablet, Rfl: 1   metoprolol tartrate (LOPRESSOR) 25 MG tablet, Take 1 tablet (25 mg total) by mouth 2 (two) times daily., Disp: 180 tablet, Rfl: 1   nortriptyline (PAMELOR) 10 MG capsule, Take 10 mg by mouth at bedtime. (Patient not taking: Reported on 10/26/2022), Disp: , Rfl:    oxyCODONE-acetaminophen (PERCOCET/ROXICET) 5-325 MG tablet, Take 1 tablet by mouth every 6 (six)  hours as needed for severe pain. (Patient not taking: Reported on 10/26/2022), Disp: 6 tablet, Rfl: 0   spironolactone (ALDACTONE) 25 MG tablet, Take 1 tablet (25 mg total) by mouth daily., Disp: 90 tablet, Rfl: 1   Allergies  Allergen Reactions   Sulfa Antibiotics       The patient states she is status post hysterectomy.  No LMP recorded. Patient has had a hysterectomy.. Negative for Dysmenorrhea and Negative for Menorrhagia. Negative for: breast discharge, breast lump(s), breast pain and breast self exam. Associated symptoms include abnormal vaginal bleeding. Pertinent negatives include abnormal bleeding (hematology), anxiety, decreased libido, depression, difficulty falling sleep, dyspareunia, history of infertility, nocturia, sexual dysfunction, sleep disturbances, urinary incontinence, urinary urgency, vaginal discharge and vaginal itching. Diet regular; she avoids  food with seeds and unable to eat lettuce to to GI issues. 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   The patient's tobacco use is:  Social History   Tobacco Use  Smoking Status Every Day   Packs/day: 0.50   Years: 8.00   Additional pack years: 0.00   Total pack years: 4.00   Types: Cigarettes   Last attempt to quit: 12/19/2021   Years since quitting: 0.8  Smokeless Tobacco Never  Tobacco Comments   She stopped since her last visit   . She has been exposed to passive smoke. The patient's alcohol use is:  Social History   Substance and Sexual Activity  Alcohol Use No  . Additional information: Last pap unknown  Review of Systems  Constitutional: Negative.   HENT: Negative.    Eyes: Negative.   Respiratory: Negative.    Cardiovascular: Negative.   Gastrointestinal: Negative.   Endocrine: Negative.   Genitourinary: Negative.   Musculoskeletal: Negative.   Skin: Negative.   Allergic/Immunologic: Negative.   Neurological: Negative.   Hematological: Negative.   Psychiatric/Behavioral: Negative.       Today's Vitals   10/26/22 1552 10/26/22 1609  BP: (!) 140/100 (!) 120/100  Pulse: 80   Temp: 98.6 F (37 C)   TempSrc: Oral   Weight: 254 lb 9.6 oz (115.5 kg)   Height: 5\' 8"  (1.727 m)   PainSc: 0-No pain    Body mass index is 38.71 kg/m.   Objective:  Physical Exam Constitutional:      General: She is not in acute distress.    Appearance: Normal appearance. She is well-developed. She is obese.  HENT:     Head: Normocephalic and atraumatic.     Right Ear: Hearing, tympanic membrane, ear canal and external ear normal. There is no impacted cerumen.     Left Ear: Hearing, tympanic membrane, ear canal and external ear normal. There is no impacted cerumen.     Nose: Nose normal.     Mouth/Throat:     Mouth: Mucous membranes are moist.  Eyes:     General: Lids are normal.     Extraocular Movements: Extraocular movements  intact.     Conjunctiva/sclera: Conjunctivae normal.     Pupils: Pupils are equal, round, and reactive to light.     Funduscopic exam:    Right eye: No papilledema.        Left eye: No papilledema.  Neck:     Thyroid: No thyroid mass.     Vascular: No carotid bruit.  Cardiovascular:     Rate and Rhythm: Normal rate and regular rhythm.     Pulses: Normal pulses.     Heart sounds: Normal  heart sounds. No murmur heard. Pulmonary:     Effort: Pulmonary effort is normal. No respiratory distress.     Breath sounds: Normal breath sounds. No wheezing.  Chest:     Chest wall: No mass.  Breasts:    Tanner Score is 5.     Right: Normal. No mass or tenderness.     Left: Normal. No mass or tenderness.  Abdominal:     General: Abdomen is flat. Bowel sounds are normal. There is no distension.     Palpations: Abdomen is soft.     Tenderness: There is no abdominal tenderness.  Genitourinary:    Rectum: Guaiac result negative.  Musculoskeletal:        General: No swelling. Normal range of motion.     Cervical back: Full passive range of motion without pain, normal range of motion and neck supple.     Right lower leg: No edema.     Left lower leg: No edema.  Lymphadenopathy:     Upper Body:     Right upper body: No supraclavicular, axillary or pectoral adenopathy.     Left upper body: No supraclavicular, axillary or pectoral adenopathy.  Skin:    General: Skin is warm and dry.     Capillary Refill: Capillary refill takes less than 2 seconds.  Neurological:     General: No focal deficit present.     Mental Status: She is alert and oriented to person, place, and time.     Cranial Nerves: No cranial nerve deficit.     Sensory: No sensory deficit.  Psychiatric:        Mood and Affect: Mood normal.        Behavior: Behavior normal.        Thought Content: Thought content normal.        Judgment: Judgment normal.         Assessment And Plan:     1. Encounter for health maintenance  examination Behavior modifications discussed and diet history reviewed.   Pt will continue to exercise regularly and modify diet with low GI, plant based foods and decrease intake of processed foods.  Recommend intake of daily multivitamin, Vitamin D, and calcium.  Recommend mammogram and colonoscopy for preventive screenings, as well as recommend immunizations that include influenza, TDAP, and Shingles (declined).   2. Encounter for screening mammogram for breast cancer Pt instructed on Self Breast Exam.According to ACOG guidelines Women aged 1 and older are recommended to get an annual mammogram. Form completed and given to patient contact the [facility] for appointment scheduing.  Pt encouraged to get annual mammogram  3. Encounter for screening for lipoid disorders - Lipid panel  4. Class 2 severe obesity due to excess calories with serious comorbidity and body mass index (BMI) of 38.0 to 38.9 in adult Chronic Discussed healthy diet and regular exercise options  Encouraged to exercise at least 150 minutes per week with 2 days of strength training Will start Zepbound pending insurance approval she is to titrate weekly, discussed side effects of nausea, abdominal pain or difficulty swallowing to notify office. Zepbound teaching to be done once she has received medications Return in 2 months for weight check. - Hemoglobin A1c - tirzepatide (ZEPBOUND) 2.5 MG/0.5ML Pen; Inject 2.5 mg into the skin once a week.  Dispense: 2 mL; Refill: 0  5. Primary hypertension Comments: Blood pressure is elevated improved with second check but still elevated add medication. Encouraged to focus on lifestyle modifications. EKG done  NSR HR 80 - EKG 12-Lead - Microalbumin / creatinine urine ratio - POCT urinalysis dipstick - CMP14+EGFR - metoprolol tartrate (LOPRESSOR) 25 MG tablet; Take 1 tablet (25 mg total) by mouth 2 (two) times daily.  Dispense: 180 tablet; Refill: 1 - losartan (COZAAR) 100 MG  tablet; Take 1 tablet (100 mg total) by mouth daily.  Dispense: 90 tablet; Refill: 1 - spironolactone (ALDACTONE) 25 MG tablet; Take 1 tablet (25 mg total) by mouth daily.  Dispense: 90 tablet; Refill: 1  6. Herpes zoster vaccination declined Declines shingrix, educated on disease process and is aware if he changes his mind to notify office  7. Other long term (current) drug therapy - CBC   Patient was given opportunity to ask questions. Patient verbalized understanding of the plan and was able to repeat key elements of the plan. All questions were answered to their satisfaction.   Arnette Felts, FNP   I, Arnette Felts, FNP, have reviewed all documentation for this visit. The documentation on 11/07/22 for the exam, diagnosis, procedures, and orders are all accurate and complete.   THE PATIENT IS ENCOURAGED TO PRACTICE SOCIAL DISTANCING DUE TO THE COVID-19 PANDEMIC.

## 2022-10-27 LAB — CBC
Hematocrit: 39.9 % (ref 34.0–46.6)
Hemoglobin: 13.4 g/dL (ref 11.1–15.9)
MCH: 29.3 pg (ref 26.6–33.0)
MCHC: 33.6 g/dL (ref 31.5–35.7)
MCV: 87 fL (ref 79–97)
Platelets: 303 10*3/uL (ref 150–450)
RBC: 4.57 x10E6/uL (ref 3.77–5.28)
RDW: 13.8 % (ref 11.7–15.4)
WBC: 7.6 10*3/uL (ref 3.4–10.8)

## 2022-10-27 LAB — MICROALBUMIN / CREATININE URINE RATIO
Creatinine, Urine: 136.6 mg/dL
Microalb/Creat Ratio: 22 mg/g creat (ref 0–29)
Microalbumin, Urine: 29.8 ug/mL

## 2022-10-27 LAB — CMP14+EGFR
ALT: 23 IU/L (ref 0–32)
AST: 17 IU/L (ref 0–40)
Albumin/Globulin Ratio: 1.5 (ref 1.2–2.2)
Albumin: 4.2 g/dL (ref 3.8–4.9)
Alkaline Phosphatase: 281 IU/L — ABNORMAL HIGH (ref 44–121)
BUN/Creatinine Ratio: 16 (ref 9–23)
BUN: 14 mg/dL (ref 6–24)
Bilirubin Total: 0.9 mg/dL (ref 0.0–1.2)
CO2: 22 mmol/L (ref 20–29)
Calcium: 9.8 mg/dL (ref 8.7–10.2)
Chloride: 102 mmol/L (ref 96–106)
Creatinine, Ser: 0.89 mg/dL (ref 0.57–1.00)
Globulin, Total: 2.8 g/dL (ref 1.5–4.5)
Glucose: 95 mg/dL (ref 70–99)
Potassium: 3.7 mmol/L (ref 3.5–5.2)
Sodium: 139 mmol/L (ref 134–144)
Total Protein: 7 g/dL (ref 6.0–8.5)
eGFR: 78 mL/min/{1.73_m2} (ref >59)

## 2022-10-27 LAB — HEMOGLOBIN A1C
Est. average glucose Bld gHb Est-mCnc: 128 mg/dL
Hgb A1c MFr Bld: 6.1 % — ABNORMAL HIGH (ref 4.8–5.6)

## 2022-10-27 LAB — LIPID PANEL
Chol/HDL Ratio: 2.6 ratio (ref 0.0–4.4)
Cholesterol, Total: 190 mg/dL (ref 100–199)
HDL: 74 mg/dL (ref >39)
LDL Chol Calc (NIH): 103 mg/dL — ABNORMAL HIGH (ref 0–99)
Triglycerides: 70 mg/dL (ref 0–149)
VLDL Cholesterol Cal: 13 mg/dL (ref 5–40)

## 2022-11-03 ENCOUNTER — Encounter: Payer: Self-pay | Admitting: Nurse Practitioner

## 2022-11-14 ENCOUNTER — Ambulatory Visit: Payer: Self-pay

## 2022-12-04 DIAGNOSIS — K219 Gastro-esophageal reflux disease without esophagitis: Secondary | ICD-10-CM | POA: Diagnosis not present

## 2022-12-04 DIAGNOSIS — K581 Irritable bowel syndrome with constipation: Secondary | ICD-10-CM | POA: Diagnosis not present

## 2022-12-04 DIAGNOSIS — K5909 Other constipation: Secondary | ICD-10-CM | POA: Diagnosis not present

## 2022-12-04 DIAGNOSIS — R1084 Generalized abdominal pain: Secondary | ICD-10-CM | POA: Diagnosis not present

## 2023-01-05 ENCOUNTER — Encounter: Payer: Self-pay | Admitting: *Deleted

## 2023-01-05 NOTE — Progress Notes (Signed)
[facility] Quality Team Note  Name: Mckenzie Anderson Date of Birth: 1970/12/07 MRN: [id number] Date: 01/05/2023  [facility] Quality Team has reviewed this patient's chart, please see recommendations below:  [facility] Quality Other; Pt has open gaps for blood pressure (last one out of range) and mammogram.  Has ov 01/11/2023.  Would pt mind following up with pt on mammogram.  Recent order in Epic looks like it may have been canceled.

## 2023-01-11 ENCOUNTER — Ambulatory Visit: Payer: BC Managed Care – PPO | Admitting: Nurse Practitioner

## 2023-01-11 ENCOUNTER — Encounter: Payer: Self-pay | Admitting: Nurse Practitioner

## 2023-01-11 VITALS — BP 140/90 | HR 75 | Temp 98.6°F | Ht 68.0 in | Wt 256.8 lb

## 2023-01-11 DIAGNOSIS — I10 Essential (primary) hypertension: Secondary | ICD-10-CM | POA: Diagnosis not present

## 2023-01-11 DIAGNOSIS — Z79899 Other long term (current) drug therapy: Secondary | ICD-10-CM

## 2023-01-11 DIAGNOSIS — Z1231 Encounter for screening mammogram for malignant neoplasm of breast: Secondary | ICD-10-CM

## 2023-01-11 DIAGNOSIS — R7303 Prediabetes: Secondary | ICD-10-CM

## 2023-01-11 DIAGNOSIS — Z6839 Body mass index (BMI) 39.0-39.9, adult: Secondary | ICD-10-CM

## 2023-01-11 DIAGNOSIS — E6609 Other obesity due to excess calories: Secondary | ICD-10-CM | POA: Diagnosis not present

## 2023-01-11 MED ORDER — OLMESARTAN MEDOXOMIL 20 MG PO TABS
20.0000 mg | ORAL_TABLET | Freq: Every day | ORAL | 1 refills | Status: DC
Start: 2023-01-11 — End: 2023-04-24

## 2023-01-11 NOTE — Progress Notes (Signed)
I,[name], CMA,acting as a Neurosurgeon for [employer], FNP.,have documented all relevant documentation on the behalf of Arnette Felts, FNP,as directed by  Arnette Felts, FNP while in the presence of Arnette Felts, FNP.  Subjective:  Patient ID: Mckenzie Anderson , female    DOB: 09/25/1970 , 52 y.o.   MRN: [id number]  Chief Complaint  Patient presents with   Hypertension    HPI  Patient presents today for a bp check. Patient reports compliance with her meds. Patient does not have any questions or concerns at this time.  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 The problem is uncontrolled. Pertinent negatives include no chest pain or palpitations. Risk factors for coronary artery disease include obesity, stress, smoking/tobacco exposure and sedentary lifestyle. Past treatments include angiotensin blockers, diuretics and lifestyle changes. There are no compliance problems.  There is no history of chronic renal disease.  Hyperlipidemia This is a chronic problem. The current episode started more than 1 year ago. The problem is controlled. She has no history of chronic renal disease. There are no known factors aggravating her hyperlipidemia. Pertinent negatives include no chest pain.     Past Medical History:  Diagnosis Date   Asthma    Diverticulosis    Gross hematuria    Heartburn    Hypertension    Migraines    Rupture of right distal biceps tendon 01/11/2018     Family History  Problem Relation Age of Onset   Hypertension Mother    Diabetes Mother    Gout Mother    Cancer Father    Kidney cancer Father    Seizures Maternal Aunt    Cancer Maternal Grandmother    Breast cancer Maternal Grandmother 60   Hypertension Maternal Grandfather     Obesity Maternal Grandfather    Diabetes Paternal Grandmother    Diabetes Paternal Grandfather    Cancer Other    Seizures Other        Grandmother's sister   Migraines Neg Hx    Bladder Cancer Neg Hx      Current Outpatient Medications:    Cholecalciferol (VITAMIN D3) 1.25 MG (50000 UT) CAPS, Take 50,000 Units by mouth once a week., Disp: , Rfl:    dicyclomine (BENTYL) 10 MG capsule, Take 1 capsule (10 mg total) by mouth 4 (four) times daily -  before meals and at bedtime., Disp: 30 capsule, Rfl: 2   ferrous sulfate 325 (65 FE) MG EC tablet, Take 1 tablet by mouth daily., Disp: , Rfl:    fluticasone (FLONASE) 50 MCG/ACT nasal spray, Place 1 spray into both nostrils daily as needed for allergies. , Disp: , Rfl:    lubiprostone (AMITIZA) 24 MCG capsule, Take 24 mcg by mouth 2 (two) times daily., Disp: , Rfl:    metoprolol tartrate (LOPRESSOR) 25 MG tablet, Take 1 tablet (25 mg total) by mouth 2 (two) times daily., Disp: 180 tablet, Rfl: 1   nortriptyline (PAMELOR) 10 MG capsule, Take 10 mg by mouth at bedtime., Disp: , Rfl:    olmesartan (BENICAR) 20 MG tablet, Take 1 tablet (20 mg total) by mouth daily., Disp: 90 tablet, Rfl: 1   pantoprazole (PROTONIX) 40 MG tablet, Take  1 tablet (40 mg total) by mouth 2 (two) times daily., Disp: 28 tablet, Rfl: 0   spironolactone (ALDACTONE) 25 MG tablet, Take 1 tablet (25 mg total) by mouth daily., Disp: 90 tablet, Rfl: 1   tirzepatide (ZEPBOUND) 2.5 MG/0.5ML Pen, Inject 2.5 mg into the skin once a week., Disp: 2 mL, Rfl: 0   ibuprofen (ADVIL) 800 MG tablet, Take 1 tablet (800 mg total) by mouth every 8 (eight) hours as needed. Take with food to prevent GI upset, Disp: 21 tablet, Rfl: 0   tiZANidine (ZANAFLEX) 4 MG tablet, Take 1 tablet (4 mg total) by mouth at bedtime as needed for muscle spasms. Do not take with alcohol or while driving or operating heavy machinery.  May cause drowsiness., Disp: 30 tablet, Rfl: 0   Allergies  Allergen Reactions    Sulfa Antibiotics      Review of Systems  Constitutional: Negative.   Eyes: Negative.   Respiratory: Negative.    Cardiovascular: Negative.  Negative for chest pain, palpitations and leg swelling.  Gastrointestinal: Negative.   Endocrine: Negative.   Genitourinary: Negative.   Musculoskeletal: Negative.   Skin: Negative.   Neurological: Negative.   Psychiatric/Behavioral: Negative.       Today's Vitals   01/11/23 1536 01/11/23 1617  BP: (!) 170/100 (!) 140/90  Pulse: 75   Temp: 98.6 F (37 C)   Weight: 256 lb 12.8 oz (116.5 kg)   Height: 5\' 8"  (1.727 m)   PainSc: 0-No pain    Body mass index is 39.05 kg/m.  Wt Readings from Last 3 Encounters:  01/11/23 256 lb 12.8 oz (116.5 kg)  10/26/22 254 lb 9.6 oz (115.5 kg)  09/12/22 255 lb (115.7 kg)     Objective:  Physical Exam Vitals reviewed.  Constitutional:      General: She is not in acute distress.    Appearance: Normal appearance. She is obese.  Cardiovascular:     Rate and Rhythm: Normal rate and regular rhythm.     Pulses: Normal pulses.     Heart sounds: Normal heart sounds. No murmur heard. Pulmonary:     Effort: Pulmonary effort is normal. No respiratory distress.     Breath sounds: Normal breath sounds. No wheezing.  Musculoskeletal:        General: No swelling or tenderness. Normal range of motion.  Skin:    General: Skin is warm and dry.     Capillary Refill: Capillary refill takes less than 2 seconds.  Neurological:     General: No focal deficit present.     Mental Status: She is alert and oriented to person, place, and time.     Cranial Nerves: No cranial nerve deficit.     Motor: No weakness.         Assessment And Plan:  1. Uncontrolled hypertension Comments: Blood pressure is elevated, repeat blood pressure is improving.  Will add olmesartan.  Return in 2 weeks for nurse visit blood pressure check.  I have advised her that her insurance may cover for a blood pressure cuff and circumference  for her arms are as follows right 13.25 inches Left 13.5 inches - olmesartan (BENICAR) 20 MG tablet; Take 1 tablet (20 mg total) by mouth daily.  Dispense: 90 tablet; Refill: 1  2. Prediabetes Comments: Hemoglobin A1c is stable.  Continue focusing on healthy diet low in sugar and starches  3. Class 2 obesity due to excess calories with body mass index (BMI) of 39.0 to 39.9 in adult,  unspecified whether serious comorbidity present Comments: She is unable to get Zepbound due to insurance coverage. Encouraged to continue exercising regularly at least 150 minutes/week and a diet low in carbs.  4. Other long term (current) drug therapy  5. Encounter for screening mammogram for breast cancer Pt instructed on Self Breast Exam.According to ACOG guidelines Women aged 60 and older are recommended to get an annual mammogram. Form completed and given to patient [facility] the [facility] for appointment scheduing.  Pt encouraged to get annual mammogram   Return for Uncontrolled BP check-3/4 months.  Patient was given opportunity to ask questions. Patient verbalized understanding of the plan and was able to repeat key elements of the plan. All questions were answered to their satisfaction.    Jeanell Sparrow, FNP, have reviewed all documentation for this visit. The documentation on 01/11/23 for the exam, diagnosis, procedures, and orders are all accurate and complete.   IF YOU HAVE BEEN REFERRED TO A SPECIALIST, IT MAY TAKE 1-2 WEEKS TO SCHEDULE/PROCESS THE REFERRAL. IF YOU HAVE NOT HEARD FROM US/SPECIALIST IN TWO WEEKS, PLEASE GIVE [place] A CALL AT [phone].

## 2023-01-11 NOTE — Patient Instructions (Addendum)
Hypertension, Adult Hypertension is another name for high blood pressure. High blood pressure forces your heart to work harder to pump blood. This can cause problems over time. There are two numbers in a blood pressure reading. There is a top number (systolic) over a bottom number (diastolic). It is best to have a blood pressure that is below 120/80. What are the causes? The cause of this condition is not known. Some other conditions can lead to high blood pressure. What increases the risk? Some lifestyle factors can make you more likely to develop high blood pressure: Smoking. Not getting enough exercise or physical activity. Being overweight. Having too much fat, sugar, calories, or salt (sodium) in your diet. Drinking too much alcohol. Other risk factors include: Having any of these conditions: Heart disease. Diabetes. High cholesterol. Kidney disease. Obstructive sleep apnea. Having a family history of high blood pressure and high cholesterol. Age. The risk increases with age. Stress. What are the signs or symptoms? High blood pressure may not cause symptoms. Very high blood pressure (hypertensive crisis) may cause: Headache. Fast or uneven heartbeats (palpitations). Shortness of breath. Nosebleed. Vomiting or feeling like you may vomit (nauseous). Changes in how you see. Very bad chest pain. Feeling dizzy. Seizures. How is this treated? This condition is treated by making healthy lifestyle changes, such as: Eating healthy foods. Exercising more. Drinking less alcohol. Your doctor may prescribe medicine if lifestyle changes do not help enough and if: Your top number is above 130. Your bottom number is above 80. Your personal target blood pressure may vary. Follow these instructions at home: Eating and drinking  If told, follow the DASH eating plan. To follow this plan: Fill one half of your plate at each meal with fruits and vegetables. Fill one fourth of your plate  at each meal with whole grains. Whole grains include whole-wheat pasta, brown rice, and whole-grain bread. Eat or drink low-fat dairy products, such as skim milk or low-fat yogurt. Fill one fourth of your plate at each meal with low-fat (lean) proteins. Low-fat proteins include fish, chicken without skin, eggs, beans, and tofu. Avoid fatty meat, cured and processed meat, or chicken with skin. Avoid pre-made or processed food. Limit the amount of salt in your diet to less than 1,500 mg each day. Do not drink alcohol if: Your doctor tells you not to drink. You are pregnant, may be pregnant, or are planning to become pregnant. If you drink alcohol: Limit how much you have to: 0-1 drink a day for women. 0-2 drinks a day for men. Know how much alcohol is in your drink. In the [place] one drink equals one 12 oz bottle of beer (355 mL), one 5 oz glass of wine (148 mL), or one 1 oz glass of hard liquor (44 mL). Lifestyle  Work with your doctor to stay at a healthy weight or to lose weight. Ask your doctor what the best weight is for you. Get at least 30 minutes of exercise that causes your heart to beat faster (aerobic exercise) most days of the week. This may include walking, swimming, or biking. Get at least 30 minutes of exercise that strengthens your muscles (resistance exercise) at least 3 days a week. This may include lifting weights or doing Pilates. Do not smoke or use any products that contain nicotine or tobacco. If you need help quitting, ask your doctor. Check your blood pressure at home as told by your doctor. Keep all follow-up visits. Medicines Take over-the-counter and prescription medicines  only as told by your doctor. Follow directions carefully. Do not skip doses of blood pressure medicine. The medicine does not work as well if you skip doses. Skipping doses also puts you at risk for problems. Ask your doctor about side effects or reactions to medicines that you should watch  for. Contact a doctor if: You think you are having a reaction to the medicine you are taking. You have headaches that keep coming back. You feel dizzy. You have swelling in your ankles. You have trouble with your vision. Get help right away if: You get a very bad headache. You start to feel mixed up (confused). You feel weak or numb. You feel faint. You have very bad pain in your: Chest. Belly (abdomen). You vomit more than once. You have trouble breathing. These symptoms may be an emergency. Get help right away. Call 911. Do not wait to see if the symptoms will go away. Do not drive yourself to the hospital. Summary Hypertension is another name for high blood pressure. High blood pressure forces your heart to work harder to pump blood. For most people, a normal blood pressure is less than 120/80. Making healthy choices can help lower blood pressure. If your blood pressure does not get lower with healthy choices, you may need to take medicine. This information is not intended to replace advice given to you by your health care provider. Make sure you discuss any questions you have with your health care provider. Document Revised: 04/21/2021 Document Reviewed: 04/21/2021 Elsevier Patient Education  2024 [employer]   Get some melatonin 3 mg and take before bed.

## 2023-01-16 ENCOUNTER — Ambulatory Visit (INDEPENDENT_AMBULATORY_CARE_PROVIDER_SITE_OTHER): Payer: BC Managed Care – PPO

## 2023-01-16 ENCOUNTER — Ambulatory Visit
Admission: EM | Admit: 2023-01-16 | Discharge: 2023-01-16 | Disposition: A | Discharge status: Home / Self Care | Payer: BC Managed Care – PPO | Attending: Nurse Practitioner | Admitting: Nurse Practitioner

## 2023-01-16 DIAGNOSIS — M25561 Pain in right knee: Secondary | ICD-10-CM | POA: Diagnosis not present

## 2023-01-16 DIAGNOSIS — G8929 Other chronic pain: Secondary | ICD-10-CM | POA: Diagnosis not present

## 2023-01-16 DIAGNOSIS — M419 Scoliosis, unspecified: Secondary | ICD-10-CM | POA: Diagnosis not present

## 2023-01-16 DIAGNOSIS — M545 Low back pain, unspecified: Secondary | ICD-10-CM | POA: Diagnosis not present

## 2023-01-16 DIAGNOSIS — M1711 Unilateral primary osteoarthritis, right knee: Secondary | ICD-10-CM | POA: Diagnosis not present

## 2023-01-16 DIAGNOSIS — M4317 Spondylolisthesis, lumbosacral region: Secondary | ICD-10-CM | POA: Diagnosis not present

## 2023-01-16 DIAGNOSIS — M47816 Spondylosis without myelopathy or radiculopathy, lumbar region: Secondary | ICD-10-CM | POA: Diagnosis not present

## 2023-01-16 MED ORDER — IBUPROFEN 800 MG PO TABS: 800.0000 mg | ORAL_TABLET | Freq: Three times a day (TID) | ORAL | 0 refills | Status: DC | PRN

## 2023-01-16 MED ORDER — TIZANIDINE HCL 4 MG PO TABS: 4.0000 mg | ORAL_TABLET | Freq: Every evening | ORAL | 0 refills | Status: DC | PRN

## 2023-01-16 NOTE — ED Triage Notes (Signed)
Pt c/o right side lower back and leg pain x dull numb and tingling pain down into her toes, x 2 weeks has gotten worse in the last week. Standing for long periods of time agitates the pain, swelling in the right leg.

## 2023-01-16 NOTE — ED Provider Notes (Signed)
[facility]    CSN: [id number] Arrival date & time: 01/16/23  1522      History   Chief Complaint No chief complaint on file.   HPI Mckenzie Anderson is a 52 y.o. female.   Patient presents today with low back pain that is causing pain down her entire right leg.  She reports her right leg feels weak and is numb/tingly at times.  She denies any recent fall, accident, trauma, or injury to the back.  She cannot think of anything that seem to trigger the back pain when it started.  She states "I want to know what is wrong and not just keep taking medicine to mask the pain."  She denies recent fall or sensation of giving way of the lower extremities.  No overt weakness or current numbness in the lower extremity.  No saddle anesthesia, new urinary or bowel incontinence, fever, nausea/vomiting, dysuria/urinary frequency, or urinary urgency since the back pain started.  Has been taking ibuprofen along with pantoprazole to prevent irritation of her stomach lining with some improvement in pain.  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  Patient also, started about swelling in the side of her knee that she thinks may be a blood clot.  She is requesting a x-ray of the knee today.  She denies any shortness of breath or difficulty breathing or swelling in the calf or red streaking up the leg.  She reports her right knee is bigger than her left knee and she does not know why.    Past Medical History:  Diagnosis Date   Asthma    Diverticulosis    Gross hematuria    Heartburn    Hypertension    Migraines    Rupture of right distal biceps tendon 01/11/2018    Patient Active Problem List   Diagnosis Date Noted   Irritable bowel syndrome with constipation 06/14/2022   Lateral epicondylitis of right elbow 03/08/2022   Hx of adenomatous colonic polyps 09/06/2021   Chronic constipation 08/30/2021   Chronic heartburn 08/30/2021   Abdominal pain 08/13/2021    Need for hepatitis B screening test 08/13/2021   Immune to hepatitis A 08/13/2021   Smoking 08/13/2021   Alkaline phosphatase elevation 08/13/2021   Gastroesophageal reflux disease without esophagitis 07/09/2021   Abdominal bloating 03/17/2021   Peripheral neuritis 01/11/2018   Migraine 01/08/2016   Muscle cramp 01/08/2016   HTN (hypertension) 01/08/2016    Past Surgical History:  Procedure Laterality Date   ABDOMINAL HYSTERECTOMY     CARPAL TUNNEL RELEASE     CHOLECYSTECTOMY      OB History   No obstetric history on file.      Home Medications    Prior to Admission medications   Medication Sig Start Date End Date Taking? Authorizing Provider  ibuprofen (ADVIL) 800 MG tablet Take 1 tablet (800 mg total) by mouth every 8 (eight) hours as needed. Take with food to prevent GI upset 01/16/23  Yes Cathlean Marseilles A, NP  tiZANidine (ZANAFLEX) 4 MG tablet Take 1 tablet (4 mg total) by mouth at bedtime as needed for muscle spasms. Do not take with alcohol or while driving or operating heavy machinery.  May cause drowsiness. 01/16/23  Yes Valentino Nose, NP  Cholecalciferol (VITAMIN D3) 1.25 MG (50000 UT) CAPS Take 50,000 Units by mouth once a week. 11/28/19   [provider]  dicyclomine (BENTYL) 10 MG capsule Take 1 capsule (10  mg total) by mouth 4 (four) times daily -  before meals and at bedtime. 02/06/22   Arnette Felts, FNP  ferrous sulfate 325 (65 FE) MG EC tablet Take 1 tablet by mouth daily. 07/14/21   [provider]  fluticasone (FLONASE) 50 MCG/ACT nasal spray Place 1 spray into both nostrils daily as needed for allergies.  10/07/19   [provider]  lubiprostone (AMITIZA) 24 MCG capsule Take 24 mcg by mouth 2 (two) times daily. 09/04/22   [provider]  metoprolol tartrate (LOPRESSOR) 25 MG tablet Take 1 tablet (25 mg total) by mouth 2 (two) times daily. 10/26/22   Arnette Felts, FNP  nortriptyline (PAMELOR) 10 MG capsule Take 10 mg by  mouth at bedtime.    [provider]  olmesartan (BENICAR) 20 MG tablet Take 1 tablet (20 mg total) by mouth daily. 01/11/23   Arnette Felts, FNP  pantoprazole (PROTONIX) 40 MG tablet Take 1 tablet (40 mg total) by mouth 2 (two) times daily. 02/13/22   Arnette Felts, FNP  spironolactone (ALDACTONE) 25 MG tablet Take 1 tablet (25 mg total) by mouth daily. 10/26/22   Arnette Felts, FNP  tirzepatide (ZEPBOUND) 2.5 MG/0.5ML Pen Inject 2.5 mg into the skin once a week. 10/26/22   Arnette Felts, FNP    Family History Family History  Problem Relation Age of Onset   Hypertension Mother    Diabetes Mother    Gout Mother    Cancer Father    Kidney cancer Father    Seizures Maternal Aunt    Cancer Maternal Grandmother    Breast cancer Maternal Grandmother 52   Hypertension Maternal Grandfather    Obesity Maternal Grandfather    Diabetes Paternal Grandmother    Diabetes Paternal Grandfather    Cancer Other    Seizures Other        Grandmother's sister   Migraines Neg Hx    Bladder Cancer Neg Hx     Social History Social History   Tobacco Use   Smoking status: Every Day    Packs/day: 0.50    Years: 8.00    Additional pack years: 0.00    Total pack years: 4.00    Types: Cigarettes    Last attempt to quit: 12/19/2021    Years since quitting: 1.0   Smokeless tobacco: Never   Tobacco comments:    She stopped since her last visit   Substance Use Topics   Alcohol use: No   Drug use: No     Allergies   Sulfa antibiotics   Review of Systems Review of Systems Per HPI  Physical Exam Triage Vital Signs ED Triage Vitals  Enc Vitals Group     BP 01/16/23 1525 (!) 182/99     Pulse Rate 01/16/23 1525 89     Resp 01/16/23 1525 14     Temp 01/16/23 1525 98.5 F (36.9 C)     Temp Source 01/16/23 1525 Oral     SpO2 01/16/23 1525 96 %     Weight --      Height --      Head Circumference --      Peak Flow --      Pain Score 01/16/23 1529 6     Pain Loc --      Pain Edu? --       Excl. in GC? --    No data found.  Updated Vital Signs BP (!) 182/99 (BP Location: Right Arm)   Pulse 89  Temp 98.5 F (36.9 C) (Oral)   Resp 14   SpO2 96%   Visual Acuity Right Eye Distance:   Left Eye Distance:   Bilateral Distance:    Right Eye Near:   Left Eye Near:    Bilateral Near:     Physical Exam Vitals and nursing note reviewed.  Constitutional:      General: She is not in acute distress.    Appearance: Normal appearance. She is not toxic-appearing.  HENT:     Mouth/Throat:     Mouth: Mucous membranes are moist.     Pharynx: Oropharynx is clear.  Pulmonary:     Effort: Pulmonary effort is normal. No respiratory distress.  Musculoskeletal:       Legs:     Comments: Inspection: no swelling, bruising, obvious deformity or redness to lumbar spine or back Palpation: tender to palpation diffusely along lumbar spine and paraspinal muscles; no obvious deformities palpated ROM: Full ROM to back, bilateral lower extremities Strength: 5/5 bilateral lower extremities Neurovascular: neurovascularly intact in bilateral lower extremities  Skin:    General: Skin is warm and dry.     Capillary Refill: Capillary refill takes less than 2 seconds.     Coloration: Skin is not jaundiced or pale.     Findings: No erythema.  Neurological:     Mental Status: She is alert and oriented to person, place, and time.  Psychiatric:        Behavior: Behavior is cooperative.      UC Treatments / Results  Labs (all labs ordered are listed, but only abnormal results are displayed) Labs Reviewed - No data to display  EKG   Radiology DG Knee Complete 4 Views Right  Result Date: 01/16/2023 CLINICAL DATA:  Low back pain and right knee pain EXAM: RIGHT KNEE - COMPLETE 4+ VIEW COMPARISON:  None Available. FINDINGS: Marginal spurring in the medial compartment with mild medial compartmental articular space narrowing compatible with mild osteoarthritis. There is mild spurring of  the tibial spine. No significant knee effusion.  No acute bony findings. IMPRESSION: 1. 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 Electronically Signed   By: Gaylyn Rong M.D.   On: 01/16/2023 16:46   DG Lumbar Spine Complete  Result Date: 01/16/2023 CLINICAL DATA:  Low back pain.  Right knee pain. EXAM: LUMBAR SPINE - COMPLETE 4+ VIEW COMPARISON:  02/22/2021 and CT abdomen from 06/18/2022 FINDINGS: Mild levoconvex lumbar scoliosis with rotary component. Degenerative facet arthropathy noted bilaterally at L4-5 and L5-S1; no pars defects observed. 6 mm of grade 1 degenerative anterolisthesis at L5-S1, increased from the 06/18/2022 CT exam. No fracture or acute bony findings identified. IMPRESSION: 1. Degenerative facet arthropathy at L4-5 and L5-S1 with 6 mm of grade 1 degenerative anterolisthesis at L5-S1, increased from the 06/18/2022 CT exam. 2. Mild levoconvex lumbar scoliosis with rotary component. Electronically Signed   By: Gaylyn Rong M.D.   On: 01/16/2023 16:44    Procedures Procedures (including critical care time)  Medications Ordered in UC Medications - No data to display  Initial Impression / Assessment and Plan / UC Course  I have reviewed the triage vital signs and the nursing notes.  Pertinent labs & imaging results that were available during my care of the patient were reviewed by me and considered in my medical decision making (see chart for details).   Patient is well-appearing, normotensive, afebrile, not tachycardic, not tachypneic, oxygenating well on room air.    1. Acute pain of right knee X-ray  imaging today shows mild osteoarthritis Supportive care discussed with patient Reassurance provided-in regarding to blood clot of RLE and strict ER precautions discussed  2. Chronic right-sided low back pain without sciatica 3. Scoliosis of lumbar spine, unspecified scoliosis type Lumbar x-ray today reveals degenerative arthropathy, grade 1 anterolisthesis,  lumbar scoliosis with a rotary component Discussed findings with patient Recommended continued NSAIDs, muscle relaxant at nighttime, back exercises Also recommended formal physical therapy, however patient will think about and contact her PCP if she desires a referral  The patient was given the opportunity to ask questions.  All questions answered to their satisfaction.  The patient is in agreement to this plan.    Final Clinical Impressions(s) / UC Diagnoses   Final diagnoses:  Acute pain of right knee  Chronic right-sided low back pain without sciatica  Scoliosis of lumbar spine, unspecified scoliosis type     Discharge Instructions      The knee x-ray today shows a little bit of arthritis.  The lumbar spine x-ray shows scoliosis and a slightly rotated spine.  Her spine is also a little bit out of alignment and has arthritis.  Recommend back exercises/stretches.  See attached packet.  You can continue ibuprofen 800 mg every 8 hours with food as needed for pain.  You can also take the tizanidine at bedtime to help relax your muscles.  Follow-up with sports medicine with no improvement/worsening of symptoms despite treatment.   ED Prescriptions     Medication Sig Dispense Auth. Provider   ibuprofen (ADVIL) 800 MG tablet Take 1 tablet (800 mg total) by mouth every 8 (eight) hours as needed. Take with food to prevent GI upset 21 tablet Cathlean Marseilles A, NP   tiZANidine (ZANAFLEX) 4 MG tablet Take 1 tablet (4 mg total) by mouth at bedtime as needed for muscle spasms. Do not take with alcohol or while driving or operating heavy machinery.  May cause drowsiness. 30 tablet Valentino Nose, NP      PDMP not reviewed this encounter.   Valentino Nose, NP 01/16/23 [phone]

## 2023-01-16 NOTE — Discharge Instructions (Addendum)
The knee x-ray today shows a little bit of arthritis.  The lumbar spine x-ray shows scoliosis and a slightly rotated spine.  Her spine is also a little bit out of alignment and has arthritis.  Recommend back exercises/stretches.  See attached packet.  You can continue ibuprofen 800 mg every 8 hours with food as needed for pain.  You can also take the tizanidine at bedtime to help relax your muscles.  Follow-up with sports medicine with no improvement/worsening of symptoms despite treatment.

## 2023-01-31 ENCOUNTER — Ambulatory Visit: Payer: BC Managed Care – PPO | Admitting: Orthopaedic Surgery

## 2023-01-31 ENCOUNTER — Encounter: Payer: Self-pay | Admitting: Orthopaedic Surgery

## 2023-01-31 VITALS — BP 149/94 | HR 102 | Ht 68.0 in | Wt 262.0 lb

## 2023-01-31 DIAGNOSIS — G8929 Other chronic pain: Secondary | ICD-10-CM

## 2023-01-31 DIAGNOSIS — M25561 Pain in right knee: Secondary | ICD-10-CM | POA: Diagnosis not present

## 2023-01-31 MED ORDER — METHYLPREDNISOLONE ACETATE 40 MG/ML IJ SUSP
40.0000 mg | Freq: Once | INTRAMUSCULAR | Status: AC
Start: 2023-01-31 — End: 2023-01-31
  Administered 2023-01-31: 40 mg via INTRA_ARTICULAR

## 2023-01-31 MED ORDER — IBUPROFEN 800 MG PO TABS: 800.0000 mg | ORAL_TABLET | Freq: Three times a day (TID) | ORAL | 0 refills | Status: DC | PRN

## 2023-01-31 NOTE — Progress Notes (Signed)
My right knee hurts.  She has been seen at [facility] here in [place] on 01-16-23 with lower back pain and right knee pain.  Her back is better but the right knee is worse. She has swelling and giving way as well as popping.  It is not getting better.  She has used ice, heat, rubs, Advil with limited help.  I have reviewed her notes.  I have independently reviewed and interpreted x-rays of this patient done at another site by another physician or qualified health professional.  Her right knee has effusion, crepitus, ROM 0 to 105, limp right, positive medial [place], NV intact, no distal edema.  Encounter Diagnosis  Name Primary?   Chronic pain of right knee Yes   I would like to get a MRI of the right knee.  I am concerned about a meniscus tear.  PROCEDURE NOTE:  The patient requests injections of the right knee , verbal consent was obtained.  The right knee was prepped appropriately after time out was performed.   Sterile technique was observed and injection of 1 cc of DepoMedrol 40mg  with several cc's of plain xylocaine. Anesthesia was provided by ethyl chloride and a 20-gauge needle was used to inject the knee area. The injection was tolerated well.  A band aid dressing was applied.  The patient was advised to apply ice later today and tomorrow to the injection sight as needed.  Return in three weeks.  Take the ibuprofen.  I will call it it for refill.  She has GI problems and I told her to stop it if she has that again.  She has been taking it for the last several weeks with no problem.  Call if any problem.  Precautions discussed.  Electronically Signed Darreld Mclean, MD 7/17/202410:40 AM

## 2023-01-31 NOTE — Patient Instructions (Addendum)
Keep taking the ibuprofen and protonix to protect your stomach or you can try Aleve 2 times a day   While we are working on your approval for MRI please go ahead and call to schedule your appointment with [facility] within at least one (1) week.   Central Scheduling [phone]   Use Aspercreme, Biofreeze or Voltaren gel over the counter 2-3 times daily make sure you rub it in well each time you use it.

## 2023-02-06 ENCOUNTER — Ambulatory Visit
Admission: RE | Admit: 2023-02-06 | Discharge: 2023-02-06 | Disposition: A | Discharge status: Home / Self Care | Payer: BC Managed Care – PPO | Source: Ambulatory Visit | Attending: Orthopaedic Surgery | Admitting: Orthopaedic Surgery

## 2023-02-06 DIAGNOSIS — M25461 Effusion, right knee: Secondary | ICD-10-CM | POA: Diagnosis not present

## 2023-02-06 DIAGNOSIS — S83231A Complex tear of medial meniscus, current injury, right knee, initial encounter: Secondary | ICD-10-CM | POA: Diagnosis not present

## 2023-02-06 DIAGNOSIS — G8929 Other chronic pain: Secondary | ICD-10-CM | POA: Insufficient documentation

## 2023-02-06 DIAGNOSIS — M25561 Pain in right knee: Secondary | ICD-10-CM | POA: Diagnosis not present

## 2023-02-06 DIAGNOSIS — R609 Edema, unspecified: Secondary | ICD-10-CM | POA: Diagnosis not present

## 2023-02-10 ENCOUNTER — Other Ambulatory Visit: Payer: Self-pay | Admitting: Nurse Practitioner

## 2023-02-20 ENCOUNTER — Ambulatory Visit: Payer: BC Managed Care – PPO | Admitting: Orthopaedic Surgery

## 2023-02-20 ENCOUNTER — Ambulatory Visit: Payer: BC Managed Care – PPO

## 2023-02-20 ENCOUNTER — Encounter: Payer: Self-pay | Admitting: Orthopaedic Surgery

## 2023-02-20 VITALS — BP 147/88 | HR 97 | Ht 68.0 in | Wt 255.0 lb

## 2023-02-20 VITALS — BP 132/90 | HR 73 | Temp 97.8°F | Ht 68.0 in | Wt 255.0 lb

## 2023-02-20 DIAGNOSIS — M23221 Derangement of posterior horn of medial meniscus due to old tear or injury, right knee: Secondary | ICD-10-CM | POA: Diagnosis not present

## 2023-02-20 DIAGNOSIS — G8929 Other chronic pain: Secondary | ICD-10-CM

## 2023-02-20 DIAGNOSIS — I10 Essential (primary) hypertension: Secondary | ICD-10-CM

## 2023-02-20 NOTE — Progress Notes (Signed)
My knee hurts.  She had MRI of the right knee and it showed: IMPRESSION: 1. Complex tear of the posterior horn of the medial meniscus towards the meniscal root with a radial component and peripheral meniscal extrusion. 2. Partial-thickness cartilage loss of the patellofemoral compartment with areas of high-grade partial-thickness cartilage loss of the medial and lateral patellar facets. 3. Mild partial-thickness cartilage loss of the medial femorotibial compartment. 4. Large joint effusion.  I have explained the findings to her and used a model of the knee.  I have recommended arthroscopy of the knee.  I have independently reviewed the MRI.    Encounter Diagnosis  Name Primary?   Chronic pain of right knee Yes   She will see Dr. Romeo Apple for surgery.  Call if any problem.  Precautions discussed.  Electronically Signed Darreld Mclean, MD 8/6/20249:13 AM

## 2023-02-20 NOTE — Progress Notes (Signed)
Patient presents today for a bp check. Patient is currently metoprolol 25mg , olmesartan 20mg  and spironolactone 25mg  in the evenings. I checked patient's blood pressure and it was 130/82 p75. I waited 10 minutes and rechecked her blood pressure and it was 132/90 p73. After speaking with provider she advised patient to decrease her intake of gatorade and increase her water intake to at least 64 oz daily. Pt advised to keep her next appointment as scheduled.   BP Readings from Last 3 Encounters:  02/20/23 (!) 147/88  01/31/23 (!) 149/94  01/16/23 (!) 182/99

## 2023-03-22 ENCOUNTER — Ambulatory Visit: Payer: BC Managed Care – PPO | Admitting: Orthopedic Surgery

## 2023-03-22 VITALS — BP 145/94 | HR 105 | Ht 68.0 in | Wt 260.0 lb

## 2023-03-22 DIAGNOSIS — M1711 Unilateral primary osteoarthritis, right knee: Secondary | ICD-10-CM | POA: Diagnosis not present

## 2023-03-22 DIAGNOSIS — M23321 Other meniscus derangements, posterior horn of medial meniscus, right knee: Secondary | ICD-10-CM | POA: Diagnosis not present

## 2023-03-22 DIAGNOSIS — Z01818 Encounter for other preprocedural examination: Secondary | ICD-10-CM | POA: Diagnosis not present

## 2023-03-22 NOTE — Patient Instructions (Signed)
 Your surgery will be at [facility] by Dr Romeo Apple  The hospital will contact you with a preoperative appointment to discuss Anesthesia.  Please arrive on time or 15 minutes early for the preoperative appointment, they have a very tight schedule if you are late or do not come in your surgery will be cancelled.  The phone number is [phone]. Please bring your medications with you for the appointment. They will tell you the arrival time and medication instructions when you have your preoperative evaluation. Do not wear nail polish the day of your surgery and if you take Phentermine you need to stop this medication ONE WEEK prior to your surgery. If you take Docia Barrier, Jardiance, or Steglatro) - Hold 72 hours before the procedure.  If you take Ozempic,  Mounjaro, Bydureon or Trulicity do not take for 8 days before your surgery. If you take Victoza, Rybelsis, Saxenda or Adlyxi stop 24 hours before the procedure.  Please arrive at the hospital 2 hours before procedure if scheduled at 9:30 or later in the day or at the time the nurse tells you at your preoperative visit.   If you have my chart do not use the time given in my chart use the time given to you by the nurse during your preoperative visit.   Your surgery  time may change. Please be available for phone calls the day of your surgery and the day before. The Short Stay department may need to discuss changes about your surgery time. Not reaching the you could lead to procedure delays and possible cancellation.  You must have a ride home and someone to stay with you for 24 to 48 hours. The person taking you home will receive and sign for the your discharge instructions.  Please be prepared to give your support person's name and telephone number to Central Registration. Dr Romeo Apple will need that name and phone number post procedure.

## 2023-03-22 NOTE — Progress Notes (Signed)
Mckenzie Anderson  03/22/2023  History and physical for surgery  Procedure right knee arthroscopy partial medial meniscectomy  ASSESSMENT AND PLAN:     Encounter Diagnoses  Name Primary?   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  Acute pain was noted after stepping off of a platform.  After nonoperative treatment failed she was referred to me for surgery  We discussed the risk and benefits and outcome of possible surgery she agreed to proceed with arthroscopy of the right knee and partial medial meniscectomy   Chief Complaint  Patient presents with   Knee Pain    Right surgical consult    52 year old female acute pain right knee after stepping off of a platform.  She was seen by Dr. Hilda Lias and worked up for possible meniscal tear.  Her symptoms continued after nonoperative treatment.  MRI was obtained and she was found to have osteoarthritis and complex tear of the medial meniscus  She has not improved and is seeing me for possible surgical intervention      HISTORY SECTION :   Current Outpatient Medications:    Cholecalciferol (VITAMIN D3) 1.25 MG (50000 UT) CAPS, Take 50,000 Units by mouth once a week., Disp: , Rfl:    dicyclomine (BENTYL) 10 MG capsule, Take 1 capsule (10 mg total) by mouth 4 (four) times daily -  before meals and at bedtime., Disp: 30 capsule, Rfl: 2   ferrous sulfate 325 (65 FE) MG EC tablet, Take 1 tablet by mouth daily., Disp: , Rfl:    fluticasone (FLONASE) 50 MCG/ACT nasal spray, Place 1 spray into both nostrils daily as needed for allergies. , Disp: , Rfl:    ibuprofen (ADVIL) 800 MG tablet, Take 1 tablet (800 mg total) by mouth every 8 (eight) hours as needed. Take with food to prevent GI upset, Disp: 60 tablet, Rfl: 0   lubiprostone (AMITIZA) 24 MCG  capsule, Take 24 mcg by mouth 2 (two) times daily., Disp: , Rfl:    metoprolol tartrate (LOPRESSOR) 25 MG tablet, Take 1 tablet (25 mg total) by mouth 2 (two) times daily., Disp: 180 tablet, Rfl: 1   nortriptyline (PAMELOR) 10 MG capsule, Take 10 mg by mouth at bedtime., Disp: , Rfl:    olmesartan (BENICAR) 20 MG tablet, Take 1 tablet (20 mg total) by mouth daily., Disp: 90 tablet, Rfl: 1   pantoprazole (PROTONIX) 40 MG tablet, Take 1 tablet (40 mg total) by mouth 2 (two) times daily., Disp: 28 tablet, Rfl: 0   spironolactone (ALDACTONE) 25 MG tablet, Take 1 tablet (25 mg total) by mouth daily., Disp: 90 tablet, Rfl: 1   tiZANidine (ZANAFLEX) 4 MG tablet, Take 1 tablet (4 mg total) by mouth at bedtime as needed for muscle spasms. Do not take with alcohol or while driving or operating heavy machinery.  May cause drowsiness., Disp: 30 tablet, Rfl: 0  Allergies  Allergen Reactions   Sulfa Antibiotics      Review of Systems  Constitutional:  Negative for fever.  Respiratory:  Negative for shortness of breath.   Cardiovascular:  Negative for chest pain.  Skin: Negative.   Neurological:  Negative for tingling and sensory change.     has a past medical history of Asthma, Diverticulosis, Gross hematuria, Heartburn, Hypertension, Migraines, and Rupture  of right distal biceps tendon (01/11/2018).   Past Surgical History:  Procedure Laterality Date   ABDOMINAL HYSTERECTOMY     CARPAL TUNNEL RELEASE     CHOLECYSTECTOMY      Social History   Socioeconomic History   Marital status: Divorced    Spouse name: Not on file   Number of children: 3   Years of education: AA   Highest education level: Not on file  Occupational History   Occupation: Emergency planning/management officer tobacco packing  Tobacco Use   Smoking status: Every Day    Current packs/day: 0.00    Average packs/day: 0.5 packs/day for 8.0 years (4.0 ttl pk-yrs)    Types: Cigarettes    Start date: 12/19/2013    Last attempt to quit: 12/19/2021    Years  since quitting: 1.2   Smokeless tobacco: Never   Tobacco comments:    She stopped since her last visit   Substance and Sexual Activity   Alcohol use: No   Drug use: No   Sexual activity: Not Currently    Birth control/protection: None  Other Topics Concern   Not on file  Social History Narrative   Lives with mother and 2 children    Caffeine use: 2-3 Mt Dews occasionally    Social Determinants of Health   Financial Resource Strain: Not on file  Food Insecurity: Not on file  Transportation Needs: Not on file  Physical Activity: Not on file  Stress: Not on file  Social Connections: Unknown (05/26/2022)   Received from [facility], [facility]   Social Network    Social Network: Not on file  Intimate Partner Violence: Unknown (05/26/2022)   Received from [employer], [facility]   HITS    Physically Hurt: Not on file    Insult or Talk Down To: Not on file    Threaten Physical Harm: Not on file    Scream or Curse: Not on file     Family History  Problem Relation Age of Onset   Hypertension Mother    Diabetes Mother    Gout Mother    Cancer Father    Kidney cancer Father    Seizures Maternal Aunt    Cancer Maternal Grandmother    Breast cancer Maternal Grandmother 16   Hypertension Maternal Grandfather    Obesity Maternal Grandfather    Diabetes Paternal Grandmother    Diabetes Paternal Grandfather    Cancer Other    Seizures Other        Grandmother's sister   Migraines Neg Hx    Bladder Cancer Neg Hx       PHYSICAL EXAM SECTION: BP (!) 145/94   Pulse (!) 105   Ht 5\' 8"  (1.727 m)   Wt 260 lb (117.9 kg)   BMI 39.53 kg/m   Body mass index is 39.53 kg/m.   General appearance: Well-developed well-nourished no gross deformities  Eyes clear normal vision no evidence of conjunctivitis or jaundice, extraocular muscles intact  ENT: ears hearing normal, nasal passages clear, throat clear   Neck is supple without palpable mass, full range of  motion   Cardiovascular normal pulse and perfusion in all 4 extremities normal color without edema  Lymph nodes: No lymphadenopathy  Neurologically deep tendon reflexes are equal and normal, no sensation loss or deficits no pathologic reflexes   Skin no lacerations or ulcerations no nodularity no palpable masses, no erythema or nodularity  Psychological: Awake alert and oriented x3 mood and affect normal  Musculoskeletal: She  has a large joint effusion she has tenderness over the medial femoral condyle medial joint line and retropatellar pain  Her meniscal signs were positive for medial meniscal tear her ligaments were stable  Imaging: I personally read the images and my interpretation is    Imaging #1 x-rays of the knee grade 1 arthritis is seen on the imaging may be grade 2  Imaging #2 MRI shows cartilage loss patellofemoral joint and medial femoral condyle with complex tear with meniscal extrusion medial meniscus    11:56 AM  Fuller Canada, MD

## 2023-03-28 ENCOUNTER — Encounter: Payer: Self-pay | Admitting: Pharmacist

## 2023-03-28 ENCOUNTER — Other Ambulatory Visit: Payer: Self-pay | Admitting: Pharmacist

## 2023-03-28 NOTE — Progress Notes (Signed)
   03/28/2023  Patient ID: Mckenzie Anderson, female   DOB: 1971-02-26, 52 y.o.   MRN: [id number]  Patient appearing on report for [employer] [facility] - Hypertension Control report due to last documented ambulatory blood pressure of 131/77 on 03/28/23. Next appointment with PCP is 04/26/23.   Outreached patient to discuss hypertension control and medication management.   Current antihypertensives: Metoprolol tartrate, Spironolactone, Olmesartan  Patient has a FitBit that tracks her BP readings. Checked BP when relaxing down at home.   Current blood pressure readings: 131/77.  - More elevated when standing due to torn meniscus causing pain. Procedure scheduled for 04/17/23.   Patient denies hypotensive signs and symptoms including dizziness, lightheadedness.  Patient denies hypertensive symptoms including headache, chest pain, shortness of breath.  Patient denies side effects related to medications.    Assessment/Plan: - Currently controlled - - Reviewed goal blood pressure <130/80.   *Of note for PCP, patient reports bug bite on left elbow. 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 Caused both side of elbow to swell but is doing well now. Using Benadryl cream and has not gotten worse. Aware to reach out to PCP if it gets worse.   Marlowe Aschoff, PharmD [facility] Phone Number: [phone]

## 2023-04-09 NOTE — Patient Instructions (Signed)
Mckenzie Anderson  04/09/2023     @PREFPERIOPPHARMACY @   Your procedure is scheduled on  04/17/2023.   Report to [facility] at  0830 A.M.   Call this number if you have problems the morning of surgery:  [phone]  If you experience any cold or flu symptoms such as cough, fever, chills, shortness of breath, etc. between now and your scheduled surgery, please notify us at the above number.   Remember:  Do not eat or drink after midnight.      Take these medicines the morning of surgery with A SIP OF WATER                                    metoprolol, pantoprazole.     Do not wear jewelry, make-up or nail polish, including gel polish,  artificial nails, or any other type of covering on natural nails (fingers and  toes).  Do not wear lotions, powders, or perfumes, or deodorant.  Do not shave 48 hours prior to surgery.  Men may shave face and neck.  Do not bring valuables to the hospital.  [facility] is not responsible for any belongings or valuables.  Contacts, dentures or bridgework may not be worn into surgery.  Leave your suitcase in the car.  After surgery it may be brought to your room.  For patients admitted to the hospital, discharge time will be determined by your treatment team.  Patients discharged the day of surgery will not be allowed to drive home and must have someone with them for 24 hours.    Special instructions:   DO NOT smoke tobacco or vape for 24 hours before your procedure.  Please read over the following fact sheets that you were given. Coughing and Deep Breathing, Surgical Site Infection Prevention, Anesthesia Post-op Instructions, and Care and Recovery After Surgery      Arthroscopic Knee Ligament Repair, Care After After your knee surgery, it's common to have soreness, swelling, or pain. You may also have some fluid coming from the cuts that were made on your knee (incisions). Follow these instructions at home: Medicines Take  over-the-counter and prescription medicines only as told by your health care provider. Ask your provider if the medicine prescribed to you: Requires you to avoid driving or using machinery. Can cause constipation. You may need to take these actions to prevent or treat constipation: Drink enough fluid to keep your pee (urine) pale yellow. Take over-the-counter or prescription medicines. Eat foods that are high in fiber, such as beans, whole grains, and fresh fruits and vegetables. Limit foods that are high in fat and processed sugars, such as fried or sweet foods. If you have a brace: Wear the brace as told by your provider. Remove it only as told by your provider. Check the skin around the brace every day. Tell your provider about any concerns. Loosen the brace if your toes tingle, become numb, or turn cold and blue. Keep it clean and dry. Ask your provider when it's safe to drive if you have a brace on your knee. Bathing Do not take baths, swim, or use a hot tub until your provider approves. Keep your bandage dry until your provider says that it can be removed. If the brace is not waterproof: Do not let it get wet. Cover it with a watertight covering when you take a bath or shower.  Incision care  Follow instructions from your provider about how to take care of your incisions. Make sure you: Wash your hands with soap and water for at least 20 seconds before and after you change your dressing. If soap and water are not available, use hand sanitizer. Change your dressing as told by your provider. Leave stitches, skin glue, or tape strips in place. These skin closures may need to stay in place for 2 weeks or longer. If tape strip edges start to loosen and curl up, you may trim the loose edges. Do not remove tape strips completely unless your provider tells you to do that. Check your incision areas every day for signs of infection. Check for: Redness. More swelling or pain. Blood or more  fluid. Warmth. Pus or a bad smell. Managing pain, stiffness, and swelling  If told, put ice on the affected area. If you have a removable brace, remove it as told by your provider. Put ice in a plastic bag. Place a towel between your skin and the bag. Leave the ice on for 20 minutes, 2-3 times a day. If your skin turns bright red, remove the ice right away to prevent skin damage. The risk of damage is higher if you can't feel pain, heat, or cold. Move your toes often to reduce stiffness and swelling. Raise the injured area above the level of your heart while you are sitting or lying down. Activity Do not use your knee to walk until your provider says that you can. Use crutches or other devices as told by your provider. You will work with a physical therapist to do exercises. This will make your knee move better and get stronger. Your provider will tell you: When you may do exercises that move parts of your body. These are called motion exercises. When you may start to ride a stationary bike and other gentle exercises. When you may start to do harder exercises, such as jogging. You may have to avoid lifting. Ask your provider how much you can safely lift. Return to your normal activities as told by your provider. Ask your provider what activities are safe for you. General instructions Do not use any products that contain nicotine or tobacco. These products include cigarettes, chewing tobacco, and vaping devices, such as e-cigarettes. If you need help quitting, ask your provider. Wear compression stockings as told by your provider. These stockings help to prevent blood clots and reduce swelling in your leg. Keep all follow-up visits. Your provider will check that your knee is healing well. Contact a health care provider if: You have signs of infection in the cuts that were made on your knee. Signs include swelling, redness, warmth, pus, or a bad smell. You have a fever or chills. You have  pain that does not get better with medicine. The cuts in your knee open up. Get help right away if: You have trouble breathing. You have chest pain. You have worse pain or swelling in your calf or at the back of your knee. Your knee, foot, ankle, or toes tingle or become numb. Your foot or toes look darker than normal or are cooler than normal. These symptoms may be an emergency. Get help right away. Call 911. Do not wait to see if the symptoms will go away. Do not drive yourself to the hospital. This information is not intended to replace advice given to you by your health care provider. Make sure you discuss any questions you have with your health care  provider. Document Revised: 09/27/2022 Document Reviewed: 09/27/2022 Elsevier Patient Education  2024 [employer] General Anesthesia, Adult, Care After The following information offers guidance on how to care for yourself after your procedure. Your health care provider may also give you more specific instructions. If you have problems or questions, contact your health care provider. What can I expect after the procedure? After the procedure, it is common for people to: Have pain or discomfort at the IV site. Have nausea or vomiting. Have a sore throat or hoarseness. Have trouble concentrating. Feel cold or chills. Feel weak, sleepy, or tired (fatigue). Have soreness and body aches. These can affect parts of the body that were not involved in surgery. Follow these instructions at home: For the time period you were told by your health care provider:  Rest. Do not participate in activities where you could fall or become injured. Do not drive or use machinery. Do not drink alcohol. Do not take sleeping pills or medicines that cause drowsiness. Do not make important decisions or sign legal documents. Do not take care of children on your own. General instructions Drink enough fluid to keep your urine pale yellow. If you have sleep  apnea, surgery and certain medicines can increase your risk for breathing problems. Follow instructions from your health care provider about wearing your sleep device: Anytime you are sleeping, including during daytime naps. While taking prescription pain medicines, sleeping medicines, or medicines that make you drowsy. Return to your normal activities as told by your health care provider. Ask your health care provider what activities are safe for you. Take over-the-counter and prescription medicines only as told by your health care provider. Do not use any products that contain nicotine or tobacco. These products include cigarettes, chewing tobacco, and vaping devices, such as e-cigarettes. These can delay incision healing after surgery. If you need help quitting, ask your health care provider. Contact a health care provider if: You have nausea or vomiting that does not get better with medicine. You vomit every time you eat or drink. You have pain that does not get better with medicine. You cannot urinate or have bloody urine. You develop a skin rash. You have a fever. Get help right away if: You have trouble breathing. You have chest pain. You vomit blood. These symptoms may be an emergency. Get help right away. Call 911. Do not wait to see if the symptoms will go away. Do not drive yourself to the hospital. Summary After the procedure, it is common to have a sore throat, hoarseness, nausea, vomiting, or to feel weak, sleepy, or fatigue. For the time period you were told by your health care provider, do not drive or use machinery. Get help right away if you have difficulty breathing, have chest pain, or vomit blood. These symptoms may be an emergency. This information is not intended to replace advice given to you by your health care provider. Make sure you discuss any questions you have with your health care provider. Document Revised: 09/30/2021 Document Reviewed: 09/30/2021 Elsevier  Patient Education  2024 [employer] How to Use Chlorhexidine Before Surgery Chlorhexidine gluconate (CHG) is a germ-killing (antiseptic) solution that is used to clean the skin. It can get rid of the bacteria that normally live on the skin and can keep them away for about 24 hours. To clean your skin with CHG, you may be given: A CHG solution to use in the shower or as part of a sponge bath. A prepackaged cloth that contains  CHG. Cleaning your skin with CHG may help lower the risk for infection: While you are staying in the intensive care unit of the hospital. If you have a vascular access, such as a central line, to provide short-term or long-term access to your veins. If you have a catheter to drain urine from your bladder. If you are on a ventilator. A ventilator is a machine that helps you breathe by moving air in and out of your lungs. After surgery. What are the risks? Risks of using CHG include: A skin reaction. Hearing loss, if CHG gets in your ears and you have a perforated eardrum. Eye injury, if CHG gets in your eyes and is not rinsed out. The CHG product catching fire. Make sure that you avoid smoking and flames after applying CHG to your skin. Do not use CHG: If you have a chlorhexidine allergy or have previously reacted to chlorhexidine. On babies younger than 12 months of age. How to use CHG solution Use CHG only as told by your health care provider, and follow the instructions on the label. Use the full amount of CHG as directed. Usually, this is one bottle. During a shower Follow these steps when using CHG solution during a shower (unless your health care provider gives you different instructions): Start the shower. Use your normal soap and shampoo to wash your face and hair. Turn off the shower or move out of the shower stream. Pour the CHG onto a clean washcloth. Do not use any type of brush or rough-edged sponge. Starting at your neck, lather your body down to your  toes. Make sure you follow these instructions: If you will be having surgery, pay special attention to the part of your body where you will be having surgery. Scrub this area for at least 1 minute. Do not use CHG on your head or face. If the solution gets into your ears or eyes, rinse them well with water. Avoid your genital area. Avoid any areas of skin that have broken skin, cuts, or scrapes. Scrub your back and under your arms. Make sure to wash skin folds. Let the lather sit on your skin for 1-2 minutes or as long as told by your health care provider. Thoroughly rinse your entire body in the shower. Make sure that all body creases and crevices are rinsed well. Dry off with a clean towel. Do not put any substances on your body afterward--such as powder, lotion, or perfume--unless you are told to do so by your health care provider. Only use lotions that are recommended by the manufacturer. Put on clean clothes or pajamas. If it is the night before your surgery, sleep in clean sheets.  During a sponge bath Follow these steps when using CHG solution during a sponge bath (unless your health care provider gives you different instructions): Use your normal soap and shampoo to wash your face and hair. Pour the CHG onto a clean washcloth. Starting at your neck, lather your body down to your toes. Make sure you follow these instructions: If you will be having surgery, pay special attention to the part of your body where you will be having surgery. Scrub this area for at least 1 minute. Do not use CHG on your head or face. If the solution gets into your ears or eyes, rinse them well with water. Avoid your genital area. Avoid any areas of skin that have broken skin, cuts, or scrapes. Scrub your back and under your arms. Make sure to wash  skin folds. Let the lather sit on your skin for 1-2 minutes or as long as told by your health care provider. Using a different clean, wet washcloth, thoroughly rinse  your entire body. Make sure that all body creases and crevices are rinsed well. Dry off with a clean towel. Do not put any substances on your body afterward--such as powder, lotion, or perfume--unless you are told to do so by your health care provider. Only use lotions that are recommended by the manufacturer. Put on clean clothes or pajamas. If it is the night before your surgery, sleep in clean sheets. How to use CHG prepackaged cloths Only use CHG cloths as told by your health care provider, and follow the instructions on the label. Use the CHG cloth on clean, dry skin. Do not use the CHG cloth on your head or face unless your health care provider tells you to. When washing with the CHG cloth: Avoid your genital area. Avoid any areas of skin that have broken skin, cuts, or scrapes. Before surgery Follow these steps when using a CHG cloth to clean before surgery (unless your health care provider gives you different instructions): Using the CHG cloth, vigorously scrub the part of your body where you will be having surgery. Scrub using a back-and-forth motion for 3 minutes. The area on your body should be completely wet with CHG when you are done scrubbing. Do not rinse. Discard the cloth and let the area air-dry. Do not put any substances on the area afterward, such as powder, lotion, or perfume. Put on clean clothes or pajamas. If it is the night before your surgery, sleep in clean sheets.  For general bathing Follow these steps when using CHG cloths for general bathing (unless your health care provider gives you different instructions). Use a separate CHG cloth for each area of your body. Make sure you wash between any folds of skin and between your fingers and toes. Wash your body in the following order, switching to a new cloth after each step: The front of your neck, shoulders, and chest. Both of your arms, under your arms, and your hands. Your stomach and groin area, avoiding the  genitals. Your right leg and foot. Your left leg and foot. The back of your neck, your back, and your buttocks. Do not rinse. Discard the cloth and let the area air-dry. Do not put any substances on your body afterward--such as powder, lotion, or perfume--unless you are told to do so by your health care provider. Only use lotions that are recommended by the manufacturer. Put on clean clothes or pajamas. Contact a health care provider if: Your skin gets irritated after scrubbing. You have questions about using your solution or cloth. You swallow any chlorhexidine. Call your local poison control center [phone]) [phone] in the [place] Get help right away if: Your eyes itch badly, or they become very red or swollen. Your skin itches badly and is red or swollen. Your hearing changes. You have trouble seeing. You have swelling or tingling in your mouth or throat. You have trouble breathing. These symptoms may represent a serious problem that is an emergency. Do not wait to see if the symptoms will go away. Get medical help right away. Call your local emergency services (911 in the [place] Do not drive yourself to the hospital. Summary Chlorhexidine gluconate (CHG) is a germ-killing (antiseptic) solution that is used to clean the skin. Cleaning your skin with CHG may help to lower your risk for infection. You  may be given CHG to use for bathing. It may be in a bottle or in a prepackaged cloth to use on your skin. Carefully follow your health care provider's instructions and the instructions on the product label. Do not use CHG if you have a chlorhexidine allergy. Contact your health care provider if your skin gets irritated after scrubbing. This information is not intended to replace advice given to you by your health care provider. Make sure you discuss any questions you have with your health care provider. Document Revised: 10/31/2021 Document Reviewed: 09/13/2020 Elsevier Patient Education  2023  [employer].

## 2023-04-11 ENCOUNTER — Encounter (HOSPITAL_COMMUNITY)
Admission: RE | Admit: 2023-04-11 | Discharge: 2023-04-11 | Disposition: A | Discharge status: Home / Self Care | Payer: BC Managed Care – PPO | Source: Ambulatory Visit | Attending: Orthopedic Surgery | Admitting: Orthopedic Surgery

## 2023-04-11 ENCOUNTER — Encounter (HOSPITAL_COMMUNITY): Payer: Self-pay

## 2023-04-11 VITALS — BP 131/77 | HR 95 | Temp 97.8°F | Resp 18 | Ht 68.0 in | Wt 260.0 lb

## 2023-04-11 DIAGNOSIS — M23321 Other meniscus derangements, posterior horn of medial meniscus, right knee: Secondary | ICD-10-CM | POA: Diagnosis not present

## 2023-04-11 DIAGNOSIS — Z01818 Encounter for other preprocedural examination: Secondary | ICD-10-CM

## 2023-04-11 DIAGNOSIS — R7303 Prediabetes: Secondary | ICD-10-CM | POA: Insufficient documentation

## 2023-04-11 DIAGNOSIS — Z01812 Encounter for preprocedural laboratory examination: Secondary | ICD-10-CM | POA: Diagnosis not present

## 2023-04-11 HISTORY — DX: Gastro-esophageal reflux disease without esophagitis: K21.9

## 2023-04-11 LAB — BASIC METABOLIC PANEL
Anion gap: 9 (ref 5–15)
BUN: 17 mg/dL (ref 6–20)
CO2: 25 mmol/L (ref 22–32)
Calcium: 9.1 mg/dL (ref 8.9–10.3)
Chloride: 103 mmol/L (ref 98–111)
Creatinine, Ser: 0.77 mg/dL (ref 0.44–1.00)
GFR, Estimated: >60 mL/min (ref >60)
Glucose, Bld: 88 mg/dL (ref 70–99)
Potassium: 3.7 mmol/L (ref 3.5–5.1)
Sodium: 137 mmol/L (ref 135–145)

## 2023-04-11 LAB — CBC WITH DIFFERENTIAL/PLATELET
Abs Immature Granulocytes: 0.02 10*3/uL (ref 0.00–0.07)
Basophils Absolute: 0 10*3/uL (ref 0.0–0.1)
Basophils Relative: 0 %
Eosinophils Absolute: 0.3 10*3/uL (ref 0.0–0.5)
Eosinophils Relative: 4 %
HCT: 38.2 % (ref 36.0–46.0)
Hemoglobin: 12.5 g/dL (ref 12.0–15.0)
Immature Granulocytes: 0 %
Lymphocytes Relative: 27 %
Lymphs Abs: 2.1 10*3/uL (ref 0.7–4.0)
MCH: 29.4 pg (ref 26.0–34.0)
MCHC: 32.7 g/dL (ref 30.0–36.0)
MCV: 89.9 fL (ref 80.0–100.0)
Monocytes Absolute: 0.5 10*3/uL (ref 0.1–1.0)
Monocytes Relative: 7 %
Neutro Abs: 4.7 10*3/uL (ref 1.7–7.7)
Neutrophils Relative %: 62 %
Platelets: 321 10*3/uL (ref 150–400)
RBC: 4.25 MIL/uL (ref 3.87–5.11)
RDW: 13.2 % (ref 11.5–15.5)
WBC: 7.6 10*3/uL (ref 4.0–10.5)
nRBC: 0 % (ref 0.0–0.2)

## 2023-04-12 LAB — HEMOGLOBIN A1C
Hgb A1c MFr Bld: 6 % — ABNORMAL HIGH (ref 4.8–5.6)
Mean Plasma Glucose: 126 mg/dL

## 2023-04-16 NOTE — H&P (Signed)
Mckenzie Anderson   03/22/2023   History and physical for surgery   Procedure right knee arthroscopy partial medial meniscectomy   ASSESSMENT AND PLAN:          Encounter Diagnoses  Name Primary?   Primary osteoarthritis of right knee Yes   Derangement of posterior horn of medial meniscus of right knee     Pre-op exam        52 year old female with disabling pain in the right knee secondary to osteoarthritis and torn medial meniscus.  Acute pain was noted after stepping off of a platform.  After nonoperative treatment failed she was referred to me for surgery   We discussed the risk and benefits and outcome of possible surgery she agreed to proceed with arthroscopy of the right knee and partial medial meniscectomy         Chief Complaint  Patient presents with   Knee Pain      Right surgical consult     52 year old female acute pain right knee after stepping off of a platform.  She was seen by Dr. Hilda Lias and worked up for possible meniscal tear.  Her symptoms continued after nonoperative treatment.  MRI was obtained and she was found to have osteoarthritis and complex tear of the medial meniscus   She has not improved and is seeing me for possible surgical intervention           HISTORY SECTION :    Current Medications    Current Outpatient Medications:    Cholecalciferol (VITAMIN D3) 1.25 MG (50000 UT) CAPS, Take 50,000 Units by mouth once a week., Disp: , Rfl:    dicyclomine (BENTYL) 10 MG capsule, Take 1 capsule (10 mg total) by mouth 4 (four) times daily -  before meals and at bedtime., Disp: 30 capsule, Rfl: 2   ferrous sulfate 325 (65 FE) MG EC tablet, Take 1 tablet by mouth daily., Disp: , Rfl:    fluticasone (FLONASE) 50 MCG/ACT nasal spray, Place 1 spray into both nostrils daily as needed for allergies. , Disp: , Rfl:    ibuprofen (ADVIL) 800 MG tablet, Take 1 tablet (800 mg total) by mouth every 8 (eight) hours as needed. Take with food to prevent GI upset,  Disp: 60 tablet, Rfl: 0   lubiprostone (AMITIZA) 24 MCG capsule, Take 24 mcg by mouth 2 (two) times daily., Disp: , Rfl:    metoprolol tartrate (LOPRESSOR) 25 MG tablet, Take 1 tablet (25 mg total) by mouth 2 (two) times daily., Disp: 180 tablet, Rfl: 1   nortriptyline (PAMELOR) 10 MG capsule, Take 10 mg by mouth at bedtime., Disp: , Rfl:    olmesartan (BENICAR) 20 MG tablet, Take 1 tablet (20 mg total) by mouth daily., Disp: 90 tablet, Rfl: 1   pantoprazole (PROTONIX) 40 MG tablet, Take 1 tablet (40 mg total) by mouth 2 (two) times daily., Disp: 28 tablet, Rfl: 0   spironolactone (ALDACTONE) 25 MG tablet, Take 1 tablet (25 mg total) by mouth daily., Disp: 90 tablet, Rfl: 1   tiZANidine (ZANAFLEX) 4 MG tablet, Take 1 tablet (4 mg total) by mouth at bedtime as needed for muscle spasms. Do not take with alcohol or while driving or operating heavy machinery.  May cause drowsiness., Disp: 30 tablet, Rfl: 0     Allergies      Allergies  Allergen Reactions   Sulfa Antibiotics            Review of Systems  Constitutional:  Negative for  fever.  Respiratory:  Negative for shortness of breath.   Cardiovascular:  Negative for chest pain.  Skin: Negative.   Neurological:  Negative for tingling and sensory change.       has a past medical history of Asthma, Diverticulosis, Gross hematuria, Heartburn, Hypertension, Migraines, and Rupture of right distal biceps tendon (01/11/2018).         Past Surgical History:  Procedure Laterality Date   ABDOMINAL HYSTERECTOMY       CARPAL TUNNEL RELEASE       CHOLECYSTECTOMY              Social History         Socioeconomic History   Marital status: Divorced      Spouse name: Not on file   Number of children: 3   Years of education: AA   Highest education level: Not on file  Occupational History   Occupation: Emergency planning/management officer tobacco packing  Tobacco Use   Smoking status: Every Day      Current packs/day: 0.00      Average packs/day: 0.5 packs/day for  8.0 years (4.0 ttl pk-yrs)      Types: Cigarettes      Start date: 12/19/2013      Last attempt to quit: 12/19/2021      Years since quitting: 1.2   Smokeless tobacco: Never   Tobacco comments:      She stopped since her last visit   Substance and Sexual Activity   Alcohol use: No   Drug use: No   Sexual activity: Not Currently      Birth control/protection: None  Other Topics Concern   Not on file  Social History Narrative    Lives with mother and 2 children     Caffeine use: 2-3 Mt Dews occasionally     Social Determinants of Health        Financial Resource Strain: Not on file  Food Insecurity: Not on file  Transportation Needs: Not on file  Physical Activity: Not on file  Stress: Not on file  Social Connections: Unknown (05/26/2022)    Received from [facility], [facility]    Social Network     Social Network: Not on file  Intimate Partner Violence: Unknown (05/26/2022)    Received from [employer], [facility]    HITS     Physically Hurt: Not on file     Insult or Talk Down To: Not on file     Threaten Physical Harm: Not on file     Scream or Curse: Not on file             Family History  Problem Relation Age of Onset   Hypertension Mother     Diabetes Mother     Gout Mother     Cancer Father     Kidney cancer Father     Seizures Maternal Aunt     Cancer Maternal Grandmother     Breast cancer Maternal Grandmother 37   Hypertension Maternal Grandfather     Obesity Maternal Grandfather     Diabetes Paternal Grandmother     Diabetes Paternal Grandfather     Cancer Other     Seizures Other          Grandmother's sister   Migraines Neg Hx     Bladder Cancer Neg Hx                PHYSICAL EXAM SECTION: BP (!) 145/94   Pulse Marland Kitchen)  105   Ht 5\' 8"  (1.727 m)   Wt 260 lb (117.9 kg)   BMI 39.53 kg/m   Body mass index is 39.53 kg/m.     General appearance: Well-developed well-nourished no gross deformities   Eyes clear normal vision no  evidence of conjunctivitis or jaundice, extraocular muscles intact   ENT: ears hearing normal, nasal passages clear, throat clear    Neck is supple without palpable mass, full range of motion    Cardiovascular normal pulse and perfusion in all 4 extremities normal color without edema   Lymph nodes: No lymphadenopathy  Neurologically deep tendon reflexes are equal and normal, no sensation loss or deficits no pathologic reflexes     Skin no lacerations or ulcerations no nodularity no palpable masses, no erythema or nodularity   Psychological: Awake alert and oriented x3 mood and affect normal   Musculoskeletal: She has a large joint effusion she has tenderness over the medial femoral condyle medial joint line and retropatellar pain   Her meniscal signs were positive for medial meniscal tear her ligaments were stable   Imaging: I personally read the images and my interpretation is     Imaging #1 x-rays of the knee grade 1 arthritis is seen on the imaging may be grade 2   Imaging #2 MRI shows cartilage loss patellofemoral joint and medial femoral condyle with complex tear with meniscal extrusion medial meniscus       04/16/2023 10:11 AM    Fuller Canada, MD

## 2023-04-17 ENCOUNTER — Ambulatory Visit (HOSPITAL_COMMUNITY): Payer: BC Managed Care – PPO | Admitting: Certified Registered Nurse Anesthetist

## 2023-04-17 ENCOUNTER — Encounter (HOSPITAL_COMMUNITY)
Admission: RE | Disposition: A | Discharge status: Home / Self Care | Payer: Self-pay | Source: Ambulatory Visit | Attending: Orthopedic Surgery

## 2023-04-17 ENCOUNTER — Encounter (HOSPITAL_COMMUNITY): Payer: Self-pay | Admitting: Orthopedic Surgery

## 2023-04-17 ENCOUNTER — Ambulatory Visit (HOSPITAL_COMMUNITY)
Admission: RE | Admit: 2023-04-17 | Discharge: 2023-04-17 | Disposition: A | Discharge status: Home / Self Care | Payer: BC Managed Care – PPO | Source: Ambulatory Visit | Attending: Orthopedic Surgery | Admitting: Orthopedic Surgery

## 2023-04-17 DIAGNOSIS — J45909 Unspecified asthma, uncomplicated: Secondary | ICD-10-CM | POA: Insufficient documentation

## 2023-04-17 DIAGNOSIS — X500XXA Overexertion from strenuous movement or load, initial encounter: Secondary | ICD-10-CM | POA: Insufficient documentation

## 2023-04-17 DIAGNOSIS — S83241D Other tear of medial meniscus, current injury, right knee, subsequent encounter: Secondary | ICD-10-CM | POA: Diagnosis not present

## 2023-04-17 DIAGNOSIS — F17210 Nicotine dependence, cigarettes, uncomplicated: Secondary | ICD-10-CM | POA: Insufficient documentation

## 2023-04-17 DIAGNOSIS — M2241 Chondromalacia patellae, right knee: Secondary | ICD-10-CM | POA: Diagnosis not present

## 2023-04-17 DIAGNOSIS — M1711 Unilateral primary osteoarthritis, right knee: Secondary | ICD-10-CM | POA: Diagnosis not present

## 2023-04-17 DIAGNOSIS — I10 Essential (primary) hypertension: Secondary | ICD-10-CM | POA: Diagnosis not present

## 2023-04-17 DIAGNOSIS — S83241A Other tear of medial meniscus, current injury, right knee, initial encounter: Secondary | ICD-10-CM

## 2023-04-17 DIAGNOSIS — K219 Gastro-esophageal reflux disease without esophagitis: Secondary | ICD-10-CM | POA: Insufficient documentation

## 2023-04-17 HISTORY — DX: Other tear of medial meniscus, current injury, right knee, initial encounter: S83.241A

## 2023-04-17 HISTORY — PX: KNEE ARTHROSCOPY WITH MEDIAL MENISECTOMY: SHX5651

## 2023-04-17 LAB — GLUCOSE, CAPILLARY: Glucose-Capillary: 79 mg/dL (ref 70–99)

## 2023-04-17 SURGERY — ARTHROSCOPY, KNEE, WITH MEDIAL MENISCECTOMY
Anesthesia: General | Site: Knee | Laterality: Right

## 2023-04-17 MED ORDER — CEFAZOLIN SODIUM-DEXTROSE 2-4 GM/100ML-% IV SOLN
2.0000 g | INTRAVENOUS | Status: AC
  Administered 2023-04-17: 2 g via INTRAVENOUS

## 2023-04-17 MED ORDER — PROPOFOL 10 MG/ML IV BOLUS
INTRAVENOUS | Status: AC
  Filled 2023-04-17: qty 20

## 2023-04-17 MED ORDER — HYDROCODONE-ACETAMINOPHEN 5-325 MG PO TABS: 1.0000 | ORAL_TABLET | ORAL | 0 refills | Status: DC | PRN

## 2023-04-17 MED ORDER — MIDAZOLAM HCL 2 MG/2ML IJ SOLN
INTRAMUSCULAR | Status: AC
  Filled 2023-04-17: qty 2

## 2023-04-17 MED ORDER — IBUPROFEN 800 MG PO TABS: 800.0000 mg | ORAL_TABLET | Freq: Three times a day (TID) | ORAL | 0 refills | Status: DC | PRN

## 2023-04-17 MED ORDER — PROPOFOL 10 MG/ML IV BOLUS
INTRAVENOUS | Status: DC | PRN
  Administered 2023-04-17: 250 mg via INTRAVENOUS

## 2023-04-17 MED ORDER — EPHEDRINE 5 MG/ML INJ
INTRAVENOUS | Status: AC
  Filled 2023-04-17: qty 5

## 2023-04-17 MED ORDER — BUPIVACAINE-EPINEPHRINE (PF) 0.5% -1:200000 IJ SOLN
INTRAMUSCULAR | Status: AC
  Filled 2023-04-17: qty 60

## 2023-04-17 MED ORDER — DEXAMETHASONE SODIUM PHOSPHATE 10 MG/ML IJ SOLN
INTRAMUSCULAR | Status: AC
  Filled 2023-04-17: qty 1

## 2023-04-17 MED ORDER — FENTANYL CITRATE (PF) 100 MCG/2ML IJ SOLN
INTRAMUSCULAR | Status: AC
  Filled 2023-04-17: qty 2

## 2023-04-17 MED ORDER — PHENYLEPHRINE 80 MCG/ML (10ML) SYRINGE FOR IV PUSH (FOR BLOOD PRESSURE SUPPORT)
PREFILLED_SYRINGE | INTRAVENOUS | Status: DC | PRN
Start: 2023-04-17 — End: 2023-04-17
  Administered 2023-04-17 (×6): 80 ug via INTRAVENOUS

## 2023-04-17 MED ORDER — EPINEPHRINE PF 1 MG/ML IJ SOLN
INTRAMUSCULAR | Status: AC
  Filled 2023-04-17: qty 8

## 2023-04-17 MED ORDER — IBUPROFEN 800 MG PO TABS
800.0000 mg | ORAL_TABLET | Freq: Once | ORAL | Status: AC
  Administered 2023-04-17: 800 mg via ORAL
  Filled 2023-04-17: qty 1

## 2023-04-17 MED ORDER — LACTATED RINGERS IV SOLN: INTRAVENOUS | Status: DC

## 2023-04-17 MED ORDER — BUPIVACAINE-EPINEPHRINE (PF) 0.5% -1:200000 IJ SOLN
INTRAMUSCULAR | Status: DC | PRN
  Administered 2023-04-17: 30 mL via PERINEURAL

## 2023-04-17 MED ORDER — EPHEDRINE SULFATE (PRESSORS) 50 MG/ML IJ SOLN
INTRAMUSCULAR | Status: DC | PRN
  Administered 2023-04-17: 10 mg via INTRAVENOUS

## 2023-04-17 MED ORDER — ONDANSETRON HCL 4 MG/2ML IJ SOLN: 4.0000 mg | Freq: Once | INTRAMUSCULAR | Status: DC

## 2023-04-17 MED ORDER — ONDANSETRON HCL 4 MG/2ML IJ SOLN
INTRAMUSCULAR | Status: DC | PRN
  Administered 2023-04-17: 4 mg via INTRAVENOUS

## 2023-04-17 MED ORDER — CEFAZOLIN SODIUM-DEXTROSE 2-4 GM/100ML-% IV SOLN
INTRAVENOUS | Status: AC
  Filled 2023-04-17: qty 100

## 2023-04-17 MED ORDER — MIDAZOLAM HCL 2 MG/2ML IJ SOLN
INTRAMUSCULAR | Status: DC | PRN
  Administered 2023-04-17: 2 mg via INTRAVENOUS

## 2023-04-17 MED ORDER — LIDOCAINE HCL (CARDIAC) PF 100 MG/5ML IV SOSY
PREFILLED_SYRINGE | INTRAVENOUS | Status: DC | PRN
  Administered 2023-04-17: 100 mg via INTRAVENOUS

## 2023-04-17 MED ORDER — LIDOCAINE HCL (PF) 2 % IJ SOLN
INTRAMUSCULAR | Status: AC
  Filled 2023-04-17: qty 5

## 2023-04-17 MED ORDER — FENTANYL CITRATE (PF) 250 MCG/5ML IJ SOLN
INTRAMUSCULAR | Status: DC | PRN
  Administered 2023-04-17 (×4): 25 ug via INTRAVENOUS

## 2023-04-17 MED ORDER — SODIUM CHLORIDE 0.9 % IR SOLN
Status: DC | PRN
  Administered 2023-04-17 (×2): 3000 mL

## 2023-04-17 MED ORDER — ONDANSETRON HCL 4 MG/2ML IJ SOLN
INTRAMUSCULAR | Status: AC
  Filled 2023-04-17: qty 2

## 2023-04-17 MED ORDER — EPHEDRINE SULFATE-NACL 50-0.9 MG/10ML-% IV SOSY
PREFILLED_SYRINGE | INTRAVENOUS | Status: DC | PRN
Start: 2023-04-17 — End: 2023-04-17
  Administered 2023-04-17: 5 mg via INTRAVENOUS
  Administered 2023-04-17: 10 mg via INTRAVENOUS

## 2023-04-17 MED ORDER — HYDROCODONE-ACETAMINOPHEN 5-325 MG PO TABS
1.0000 | ORAL_TABLET | ORAL | Status: AC | PRN
  Administered 2023-04-17: 1 via ORAL
  Filled 2023-04-17: qty 1

## 2023-04-17 MED ORDER — CHLORHEXIDINE GLUCONATE 0.12 % MT SOLN
15.0000 mL | Freq: Once | OROMUCOSAL | Status: AC
  Administered 2023-04-17: 15 mL via OROMUCOSAL

## 2023-04-17 MED ORDER — DEXAMETHASONE SODIUM PHOSPHATE 10 MG/ML IJ SOLN
INTRAMUSCULAR | Status: DC | PRN
  Administered 2023-04-17: 10 mg via INTRAVENOUS

## 2023-04-17 SURGICAL SUPPLY — 41 items
ABLATOR ASPIRATE 50D MULTI-PRT (SURGICAL WAND) IMPLANT
APL PRP STRL LF DISP 70% ISPRP (MISCELLANEOUS) ×1
BAG HAMPER (MISCELLANEOUS) ×1 IMPLANT
BNDG CMPR STD VLCR NS LF 5.8X6 (GAUZE/BANDAGES/DRESSINGS) ×1
BNDG ELASTIC 6X5.8 VLCR NS LF (GAUZE/BANDAGES/DRESSINGS) ×1 IMPLANT
CHLORAPREP W/TINT 26 (MISCELLANEOUS) ×1 IMPLANT
CLOTH BEACON ORANGE TIMEOUT ST (SAFETY) ×1 IMPLANT
COOLER ICEMAN CLASSIC (MISCELLANEOUS) ×1 IMPLANT
DECANTER SPIKE VIAL GLASS SM (MISCELLANEOUS) ×2 IMPLANT
DRAPE HALF SHEET 40X57 (DRAPES) ×1 IMPLANT
GAUZE SPONGE 4X4 12PLY STRL (GAUZE/BANDAGES/DRESSINGS) ×1 IMPLANT
GAUZE XEROFORM 5X9 LF (GAUZE/BANDAGES/DRESSINGS) ×1 IMPLANT
GLOVE BIOGEL PI IND STRL 7.0 (GLOVE) ×2 IMPLANT
GLOVE SS N UNI LF 8.5 STRL (GLOVE) ×1 IMPLANT
GLOVE SURG POLYISO LF SZ8 (GLOVE) ×1 IMPLANT
GOWN STRL REUS W/TWL LRG LVL3 (GOWN DISPOSABLE) ×1 IMPLANT
GOWN STRL REUS W/TWL XL LVL3 (GOWN DISPOSABLE) ×1 IMPLANT
IV NS IRRIG 3000ML ARTHROMATIC (IV SOLUTION) ×2 IMPLANT
KIT BLADEGUARD II DBL (SET/KITS/TRAYS/PACK) ×1 IMPLANT
KIT TURNOVER CYSTO (KITS) ×1 IMPLANT
MANIFOLD NEPTUNE II (INSTRUMENTS) ×1 IMPLANT
MARKER SKIN DUAL TIP RULER LAB (MISCELLANEOUS) ×1 IMPLANT
NDL HYPO 18GX1.5 BLUNT FILL (NEEDLE) ×1 IMPLANT
NDL HYPO 21X1.5 SAFETY (NEEDLE) ×1 IMPLANT
NDL SPNL 18GX3.5 QUINCKE PK (NEEDLE) ×1 IMPLANT
NEEDLE HYPO 18GX1.5 BLUNT FILL (NEEDLE) ×1
NEEDLE HYPO 21X1.5 SAFETY (NEEDLE) ×1
NEEDLE SPNL 18GX3.5 QUINCKE PK (NEEDLE) ×1
PACK ARTHROSCOPY WL (CUSTOM PROCEDURE TRAY) ×1 IMPLANT
PAD ABD 5X9 TENDERSORB (GAUZE/BANDAGES/DRESSINGS) ×1 IMPLANT
PAD ARMBOARD 7.5X6 YLW CONV (MISCELLANEOUS) ×1 IMPLANT
PAD COLD SHLDR SM WRAP-ON (PAD) IMPLANT
PADDING CAST COTTON 6X4 STRL (CAST SUPPLIES) ×1 IMPLANT
POSITIONER HEAD 8X9X4 ADT (SOFTGOODS) ×1 IMPLANT
RESECTOR TORPEDO 4MM 13CM CVD (MISCELLANEOUS) IMPLANT
SET ARTHROSCOPY INST (INSTRUMENTS) ×1 IMPLANT
SET BASIN LINEN APH (SET/KITS/TRAYS/PACK) ×1 IMPLANT
SUT ETHILON 3 0 FSL (SUTURE) ×1 IMPLANT
SYR 10ML LL (SYRINGE) ×1 IMPLANT
SYR 30ML LL (SYRINGE) ×2 IMPLANT
TUBING IN/OUT FLOW W/MAIN PUMP (TUBING) ×1 IMPLANT

## 2023-04-17 NOTE — Brief Op Note (Addendum)
04/17/2023  12:29 PM  PATIENT:  Mckenzie Anderson  53 y.o. female  PRE-OPERATIVE DIAGNOSIS:  Right knee arthroscopy medial meniscectomy  POST-OPERATIVE DIAGNOSIS:  Right knee arthroscopy medial meniscectomy  PROCEDURE:  Procedure(s): KNEE ARTHROSCOPY WITH MEDIAL MENISCECTOMY (Right)  SURGEON:  Surgeons and Role:    Vickki Hearing, MD - Primary  PHYSICIAN ASSISTANT:   ASSISTANTS: none   ANESTHESIA:   general  EBL:  2 mL   BLOOD ADMINISTERED:none  DRAINS: none   LOCAL MEDICATIONS USED:  MARCAINE     SPECIMEN:  No Specimen  DISPOSITION OF SPECIMEN:  N/A  COUNTS:  YES  TOURNIQUET:  * No tourniquets in log *  DICTATION: .Dragon Dictation  PLAN OF CARE: Discharge to home after PACU  PATIENT DISPOSITION:  PACU - hemodynamically stable.   Delay start of Pharmacological VTE agent (>24hrs) due to surgical blood loss or risk of bleeding: not applicable

## 2023-04-17 NOTE — Anesthesia Preprocedure Evaluation (Addendum)
Anesthesia Evaluation  Patient identified by MRN, date of birth, ID band Patient awake    Reviewed: Allergy & Precautions, H&P , NPO status , Patient's Chart, lab work & pertinent test results, reviewed documented beta blocker date and time   Airway Mallampati: II  TM Distance: >3 FB Neck ROM: full    Dental  (+) Caps, Dental Advisory Given   Pulmonary Current Smoker and Patient abstained from smoking.   Pulmonary exam normal breath sounds clear to auscultation       Cardiovascular Exercise Tolerance: Good hypertension,  Rhythm:regular Rate:Normal     Neuro/Psych  Headaches  negative psych ROS   GI/Hepatic Neg liver ROS,GERD  ,,  Endo/Other    Morbid obesity  Renal/GU negative Renal ROS  negative genitourinary   Musculoskeletal   Abdominal Normal abdominal exam  (+)   Peds  Hematology negative hematology ROS (+)   Anesthesia Other Findings   Reproductive/Obstetrics negative OB ROS                             Anesthesia Physical Anesthesia Plan  ASA: 2  Anesthesia Plan: General   Post-op Pain Management: Toradol IV (intra-op)*   Induction:   PONV Risk Score and Plan: Ondansetron and Dexamethasone  Airway Management Planned: LMA  Additional Equipment:   Intra-op Plan:   Post-operative Plan:   Informed Consent: I have reviewed the patients History and Physical, chart, labs and discussed the procedure including the risks, benefits and alternatives for the proposed anesthesia with the patient or authorized representative who has indicated his/her understanding and acceptance.     Dental Advisory Given  Plan Discussed with: CRNA  Anesthesia Plan Comments:        Anesthesia Quick Evaluation

## 2023-04-17 NOTE — Anesthesia Procedure Notes (Signed)
Procedure Name: LMA Insertion Date/Time: 04/17/2023 11:35 AM  Performed by: Lorin Glass, CRNAPre-anesthesia Checklist: Emergency Drugs available, Patient identified, Suction available and Patient being monitored Patient Re-evaluated:Patient Re-evaluated prior to induction Oxygen Delivery Method: Circle system utilized Preoxygenation: Pre-oxygenation with 100% oxygen Induction Type: IV induction LMA: LMA inserted LMA Size: 4.0 Number of attempts: 1 Placement Confirmation: positive ETCO2 and breath sounds checked- equal and bilateral Tube secured with: Tape Dental Injury: Teeth and Oropharynx as per pre-operative assessment

## 2023-04-17 NOTE — Op Note (Addendum)
04/17/2023  12:29 PM  PATIENT:  Mckenzie Anderson  52 y.o. 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   Delay start of Pharmacological VTE agent (>24hrs) due to surgical blood loss or risk of bleeding: not applicable    The patient was identified in the preoperative holding area using 2 approved identification mechanisms. The chart was reviewed and updated. The surgical site was confirmed as Right knee and marked with an indelible marker.  The patient was taken to the operating room for anesthesia. After successful general anesthesia, Ancef was used as IV antibiotics.  The patient was placed in the supine position with the (right) the operative extremity in an arthroscopic leg holder and the opposite extremity in a padded leg holder.  The timeout was executed.  A lateral portal was established with an 11 blade and the scope was introduced into the joint. A diagnostic arthroscopy was performed in  circumferential manner examining the entire knee joint. A medial portal was established and the diagnostic arthroscopy was repeated using a probe to palpate intra-articular structures as they were encountered.   The medial meniscus tear was in the posterior horn it was a radial tear near the root  The medial meniscus was resected using a duckbill forceps. The meniscal fragments were removed with a motorized shaver. The meniscus was balanced with a combination of a motorized shaver and a 50 ArthroCare wand until a stable rim was obtained.  We removed 7-1/2% of the posterior horn  The arthroscopic pump was placed on the wash mode and any excess debris was removed from the joint using suction.  60 cc of Marcaine with epinephrine was injected through the arthroscope.  The portals were closed with 3-0 nylon suture.  A sterile bandage, Ace wrap and Cryo/Cuff was placed and the Cryo/Cuff was activated. The patient was taken to the recovery room in stable condition.  PHYSICIAN ASSISTANT: no  ASSISTANTS: none   ANESTHESIA:   General  EBL:  none   BLOOD ADMINISTERED:none  DRAINS: none   LOCAL MEDICATIONS USED:  MARCAINE     SPECIMEN:  No Specimen  DISPOSITION OF SPECIMEN:  N/A  COUNTS:  YES   DICTATION: .Dragon Dictation  PLAN OF CARE: Discharge to home after PACU  PATIENT DISPOSITION:  PACU - hemodynamically stable.   Delay start of Pharmacological VTE agent (>24hrs) due to surgical blood loss or risk of bleeding: not applicable  POST OP PLAN  WB as tolerated SUTURES OUT IN A WEEK  AROM  BRACE no brace needed

## 2023-04-17 NOTE — Anesthesia Postprocedure Evaluation (Signed)
Anesthesia Post Note  Patient: Mckenzie Anderson  Procedure(s) Performed: KNEE ARTHROSCOPY WITH MEDIAL MENISCECTOMY (Right: Knee)  Patient location during evaluation: PACU Anesthesia Type: General Level of consciousness: awake and alert Pain management: pain level controlled Vital Signs Assessment: post-procedure vital signs reviewed and stable Respiratory status: spontaneous breathing, nonlabored ventilation, respiratory function stable and patient connected to nasal cannula oxygen Cardiovascular status: blood pressure returned to baseline and stable Postop Assessment: no apparent nausea or vomiting Anesthetic complications: no   There were no known notable events for this encounter.   Last Vitals:  Vitals:   04/17/23 1245 04/17/23 1300  BP: 138/89 (!) 147/91  Pulse: 72 68  Resp: 14 18  Temp:    SpO2: 100% 100%    Last Pain:  Vitals:   04/17/23 1245  PainSc: 3                  Ronit Cranfield L Anaelle Dunton

## 2023-04-17 NOTE — Interval H&P Note (Signed)
History and Physical Interval Note:  04/17/2023 11:00 AM  Mckenzie Anderson  has presented today for surgery, with the diagnosis of Right knee arthroscopy medial meniscectomy.  The various methods of treatment have been discussed with the patient and family. After consideration of risks, benefits and other options for treatment, the patient has consented to  Procedure(s): KNEE ARTHROSCOPY WITH MEDIAL MENISCECTOMY (Right) as a surgical intervention.  The patient's history has been reviewed, patient examined, no change in status, stable for surgery.  I have reviewed the patient's chart and labs.  Questions were answered to the patient's satisfaction.     Fuller Canada

## 2023-04-17 NOTE — Transfer of Care (Signed)
Immediate Anesthesia Transfer of Care Note  Patient: Mckenzie Anderson  Procedure(s) Performed: KNEE ARTHROSCOPY WITH MEDIAL MENISCECTOMY (Right: Knee)  Patient Location: PACU  Anesthesia Type:General  Level of Consciousness: drowsy  Airway & Oxygen Therapy: Patient Spontanous Breathing and Patient connected to face mask oxygen  Post-op Assessment: Report given to RN and Post -op Vital signs reviewed and stable  Post vital signs: Reviewed and stable  Last Vitals:  Vitals Value Taken Time  BP 115/69 04/17/23 1228  Temp 97.8   Pulse 68 04/17/23 1229  Resp 13 04/17/23 1229  SpO2 100 % 04/17/23 1229  Vitals shown include unfiled device data.  Last Pain:  Vitals:   04/17/23 0908  PainSc: 0-No pain         Complications: No notable events documented.

## 2023-04-18 ENCOUNTER — Telehealth: Payer: Self-pay | Admitting: Orthopedic Surgery

## 2023-04-18 NOTE — Telephone Encounter (Signed)
[employer] forms received. To [employer]. 

## 2023-04-20 ENCOUNTER — Encounter: Payer: Self-pay | Admitting: Orthopedic Surgery

## 2023-04-24 ENCOUNTER — Other Ambulatory Visit: Payer: Self-pay | Admitting: Nurse Practitioner

## 2023-04-24 ENCOUNTER — Encounter (HOSPITAL_COMMUNITY): Payer: Self-pay | Admitting: Orthopedic Surgery

## 2023-04-24 DIAGNOSIS — I10 Essential (primary) hypertension: Secondary | ICD-10-CM

## 2023-04-26 ENCOUNTER — Ambulatory Visit: Payer: BC Managed Care – PPO | Admitting: Nurse Practitioner

## 2023-04-27 ENCOUNTER — Other Ambulatory Visit: Payer: Self-pay | Admitting: Nurse Practitioner

## 2023-04-27 DIAGNOSIS — I10 Essential (primary) hypertension: Secondary | ICD-10-CM

## 2023-04-30 ENCOUNTER — Ambulatory Visit (HOSPITAL_COMMUNITY)
Admission: RE | Admit: 2023-04-30 | Discharge: 2023-04-30 | Disposition: A | Discharge status: Home / Self Care | Payer: BC Managed Care – PPO | Source: Ambulatory Visit | Attending: Orthopedic Surgery | Admitting: Orthopedic Surgery

## 2023-04-30 ENCOUNTER — Telehealth: Payer: Self-pay | Admitting: Radiology

## 2023-04-30 ENCOUNTER — Ambulatory Visit (INDEPENDENT_AMBULATORY_CARE_PROVIDER_SITE_OTHER): Payer: BC Managed Care – PPO | Admitting: Orthopedic Surgery

## 2023-04-30 ENCOUNTER — Encounter: Payer: Self-pay | Admitting: Orthopedic Surgery

## 2023-04-30 DIAGNOSIS — M79661 Pain in right lower leg: Secondary | ICD-10-CM

## 2023-04-30 DIAGNOSIS — Z9889 Other specified postprocedural states: Secondary | ICD-10-CM | POA: Insufficient documentation

## 2023-04-30 NOTE — Telephone Encounter (Signed)
I called patient to let her know doppler study didn't show any blood clots she voiced understanding .Can you please call patient with 4 week follow up She said at check out the person she checked out with didn't know when to come back

## 2023-04-30 NOTE — Patient Instructions (Signed)
To Mckenzie Anderson today now for the ultrasound They are working you in so you may need to wait a bit for the study.

## 2023-04-30 NOTE — Progress Notes (Signed)
Postop follow-up #1 arthroscopy right knee partial medial meniscectomy patient complains of calf pain had severe pain after surgery for 3 days unrelieved by ibuprofen and hydrocodone  Although the knee pain has improved significantly she still has the calf pain which exacerbates with dorsiflexion of the foot so-called Homans' sign.  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   s//p right knee arthroscopy 04/17/23 Yes   Right calf pain     Continue ice ibuprofen and knee exercises return in 4 weeks  Ck u/s today

## 2023-05-09 ENCOUNTER — Other Ambulatory Visit: Payer: Self-pay | Admitting: Nurse Practitioner

## 2023-05-09 DIAGNOSIS — Z1231 Encounter for screening mammogram for malignant neoplasm of breast: Secondary | ICD-10-CM

## 2023-05-14 ENCOUNTER — Other Ambulatory Visit: Payer: Self-pay | Admitting: Orthopedic Surgery

## 2023-05-17 ENCOUNTER — Ambulatory Visit
Admission: RE | Admit: 2023-05-17 | Discharge: 2023-05-17 | Disposition: A | Discharge status: Home / Self Care | Payer: BC Managed Care – PPO | Source: Ambulatory Visit | Attending: Nurse Practitioner | Admitting: Nurse Practitioner

## 2023-05-17 DIAGNOSIS — Z1231 Encounter for screening mammogram for malignant neoplasm of breast: Secondary | ICD-10-CM | POA: Insufficient documentation

## 2023-05-23 ENCOUNTER — Telehealth: Payer: Self-pay | Admitting: Orthopedic Surgery

## 2023-05-23 NOTE — Telephone Encounter (Signed)
She is out of work from Sept 30th  until November 15th

## 2023-05-23 NOTE — Telephone Encounter (Signed)
Forms received. Please advise work status and if oow, how long will be oow. Thank you!

## 2023-05-24 ENCOUNTER — Encounter: Payer: Self-pay | Admitting: Emergency Medicine

## 2023-05-24 ENCOUNTER — Ambulatory Visit
Admission: EM | Admit: 2023-05-24 | Discharge: 2023-05-24 | Disposition: A | Discharge status: Home / Self Care | Payer: BC Managed Care – PPO | Attending: Family Medicine | Admitting: Family Medicine

## 2023-05-24 ENCOUNTER — Other Ambulatory Visit: Payer: Self-pay

## 2023-05-24 DIAGNOSIS — J069 Acute upper respiratory infection, unspecified: Secondary | ICD-10-CM

## 2023-05-24 DIAGNOSIS — H65191 Other acute nonsuppurative otitis media, right ear: Secondary | ICD-10-CM

## 2023-05-24 LAB — POC COVID19/FLU A&B COMBO
Covid Antigen, POC: NEGATIVE
Influenza A Antigen, POC: NEGATIVE
Influenza B Antigen, POC: NEGATIVE

## 2023-05-24 MED ORDER — CORICIDIN HBP 10-325-2 MG PO TABS: 1.0000 | ORAL_TABLET | Freq: Three times a day (TID) | ORAL | 0 refills | Status: AC | PRN

## 2023-05-24 MED ORDER — FLUCONAZOLE 150 MG PO TABS: 150.0000 mg | ORAL_TABLET | Freq: Once | ORAL | 0 refills | Status: AC

## 2023-05-24 MED ORDER — PREDNISONE 50 MG PO TABS: ORAL_TABLET | ORAL | 0 refills | Status: DC

## 2023-05-24 MED ORDER — FLUTICASONE PROPIONATE 50 MCG/ACT NA SUSP: 1.0000 | Freq: Two times a day (BID) | NASAL | 2 refills | Status: DC

## 2023-05-24 NOTE — ED Triage Notes (Signed)
Pt reports nasal congestion, fever, right ear pain for last several days.

## 2023-05-24 NOTE — ED Provider Notes (Signed)
[facility]    CSN: [id number] Arrival date & time: 05/24/23  1423      History   Chief Complaint Chief Complaint  Patient presents with   Nasal Congestion    HPI Mckenzie Anderson is a 52 y.o. female.   Patient presenting today with several day history of congestion, cough, fever, right ear pain and pressure.  Denies chest pain, shortness of breath, abdominal pain, nausea vomiting or diarrhea.  Taking Mucinex with minimal relief.  No known sick contacts recently.    Past Medical History:  Diagnosis Date   Asthma    Diverticulosis    GERD (gastroesophageal reflux disease)    Gross hematuria    Heartburn    Hypertension    Migraines    Rupture of right distal biceps tendon 01/11/2018    Patient Active Problem List   Diagnosis Date Noted   s//p right knee arthroscopy 04/17/23 04/30/2023   Tear of medial meniscus of right knee, current 04/17/2023   Irritable bowel syndrome with constipation 06/14/2022   Lateral epicondylitis of right elbow 03/08/2022   Hx of adenomatous colonic polyps 09/06/2021   Chronic constipation 08/30/2021   Chronic heartburn 08/30/2021   Abdominal pain 08/13/2021   Need for hepatitis B screening test 08/13/2021   Immune to hepatitis A 08/13/2021   Smoking 08/13/2021   Alkaline phosphatase elevation 08/13/2021   Gastroesophageal reflux disease without esophagitis 07/09/2021   Abdominal bloating 03/17/2021   Peripheral neuritis 01/11/2018   Migraine 01/08/2016   Muscle cramp 01/08/2016   HTN (hypertension) 01/08/2016    Past Surgical History:  Procedure Laterality Date   ABDOMINAL HYSTERECTOMY     CARPAL TUNNEL RELEASE     CHOLECYSTECTOMY     KNEE ARTHROSCOPY WITH MEDIAL MENISECTOMY Right 04/17/2023   Procedure: KNEE ARTHROSCOPY WITH MEDIAL MENISCECTOMY;  Surgeon: Vickki Hearing, MD;  Location: [facility];  Service: Orthopedics;  Laterality: Right;    OB History   No obstetric history on file.      Home Medications     Prior to Admission medications   Medication Sig Start Date End Date Taking? Authorizing Provider  DM-APAP-CPM (CORICIDIN HBP) 10-325-2 MG TABS Take 1 tablet by mouth 3 (three) times daily as needed. 05/24/23  Yes Particia Nearing, PA-C  fluconazole (DIFLUCAN) 150 MG tablet Take 1 tablet (150 mg total) by mouth once for 1 dose. 05/24/23 05/24/23 Yes Particia Nearing, PA-C  fluticasone [facility]) 50 MCG/ACT nasal spray Place 1 spray into both nostrils 2 (two) times daily. 05/24/23  Yes Particia Nearing, PA-C  predniSONE (DELTASONE) 50 MG tablet Take 1 tab daily with breakfast for 3 days 05/24/23  Yes Particia Nearing, PA-C  Cholecalciferol (VITAMIN D3) 1.25 MG (50000 UT) CAPS Take 50,000 Units by mouth every Monday. 11/28/19   [provider]  doxepin (SINEQUAN) 10 MG capsule Take 20 mg by mouth at bedtime as needed (spasms.).    [provider]  fluticasone (FLONASE) 50 MCG/ACT nasal spray Place 1 spray into both nostrils daily as needed for allergies.  10/07/19   [provider]  HYDROcodone-acetaminophen (NORCO/VICODIN) 5-325 MG tablet Take 1-2 tablets by mouth every 4 (four) hours as needed for moderate pain. 04/17/23 04/16/24  Vickki Hearing, MD  ibuprofen (ADVIL) 800 MG tablet TAKE 1 TABLET BY MOUTH EVERY 8 HOURS AS NEEDED. TAKE WITH FOOD TO PREVENT GI UPSET 05/14/23   Vickki Hearing, MD  lubiprostone (AMITIZA) 24 MCG capsule Take 24 mcg by mouth  2 (two) times daily as needed (constipation.). 09/04/22   [provider]  metoprolol tartrate (LOPRESSOR) 25 MG tablet TAKE 1 TABLET BY MOUTH TWICE A DAY 04/27/23   Arnette Felts, FNP  OIL OF OREGANO PO Take 2 capsules by mouth every evening.    [provider]  olmesartan (BENICAR) 20 MG tablet TAKE 1 TABLET BY MOUTH EVERY DAY 04/24/23   Arnette Felts, FNP  pantoprazole (PROTONIX) 40 MG tablet Take 1 tablet (40 mg total) by mouth 2 (two) times daily. Patient taking differently: Take  80 mg by mouth every evening. 02/13/22   Arnette Felts, FNP  phentermine (ADIPEX-P) 37.5 MG tablet Take 37.5 mg by mouth daily before breakfast.    [provider]  spironolactone (ALDACTONE) 25 MG tablet Take 1 tablet (25 mg total) by mouth daily. 10/26/22   Arnette Felts, FNP    Family History Family History  Problem Relation Age of Onset   Hypertension Mother    Diabetes Mother    Gout Mother    Cancer Father    Kidney cancer Father    Seizures Maternal Aunt    Cancer Maternal Grandmother    Breast cancer Maternal Grandmother 50   Hypertension Maternal Grandfather    Obesity Maternal Grandfather    Diabetes Paternal Grandmother    Diabetes Paternal Grandfather    Cancer Other    Seizures Other        Grandmother's sister   Migraines Neg Hx    Bladder Cancer Neg Hx     Social History Social History   Tobacco Use   Smoking status: Every Day    Current packs/day: 0.00    Average packs/day: 0.5 packs/day for 8.0 years (4.0 ttl pk-yrs)    Types: Cigarettes    Start date: 12/19/2013    Last attempt to quit: 12/19/2021    Years since quitting: 1.4   Smokeless tobacco: Never   Tobacco comments:    She stopped since her last visit   Vaping Use   Vaping status: Every Day  Substance Use Topics   Alcohol use: No   Drug use: No     Allergies   Sulfa antibiotics   Review of Systems Review of Systems Per HPI  Physical Exam Triage Vital Signs ED Triage Vitals  Encounter Vitals Group     BP 05/24/23 1514 (!) 143/97     Systolic BP Percentile --      Diastolic BP Percentile --      Pulse Rate 05/24/23 1514 77     Resp 05/24/23 1514 20     Temp 05/24/23 1514 98 F (36.7 C)     Temp Source 05/24/23 1514 Oral     SpO2 05/24/23 1514 96 %     Weight --      Height --      Head Circumference --      Peak Flow --      Pain Score 05/24/23 1513 9     Pain Loc --      Pain Education --      Exclude from Growth Chart --    No data found.  Updated Vital  Signs BP (!) 143/97 (BP Location: Right Arm)   Pulse 77   Temp 98 F (36.7 C) (Oral)   Resp 20   SpO2 96%   Visual Acuity Right Eye Distance:   Left Eye Distance:   Bilateral Distance:    Right Eye Near:   Left Eye Near:  Bilateral Near:     Physical Exam Vitals and nursing note reviewed.  Constitutional:      Appearance: Normal appearance. She is not ill-appearing.  HENT:     Head: Atraumatic.     Right Ear: External ear normal.     Left Ear: Tympanic membrane and external ear normal.     Ears:     Comments: Right middle ear effusion    Nose: Congestion present.     Mouth/Throat:     Mouth: Mucous membranes are moist.     Pharynx: Posterior oropharyngeal erythema present.  Eyes:     Extraocular Movements: Extraocular movements intact.     Conjunctiva/sclera: Conjunctivae normal.  Cardiovascular:     Rate and Rhythm: Normal rate and regular rhythm.     Heart sounds: Normal heart sounds.  Pulmonary:     Effort: Pulmonary effort is normal.     Breath sounds: Normal breath sounds. No wheezing or rales.  Musculoskeletal:        General: Normal range of motion.     Cervical back: Normal range of motion and neck supple.  Skin:    General: Skin is warm and dry.  Neurological:     Mental Status: She is alert and oriented to person, place, and time.  Psychiatric:        Mood and Affect: Mood normal.        Thought Content: Thought content normal.        Judgment: Judgment normal.      UC Treatments / Results  Labs (all labs ordered are listed, but only abnormal results are displayed) Labs Reviewed  POC COVID19/FLU A&B COMBO    EKG   Radiology No results found.  Procedures Procedures (including critical care time)  Medications Ordered in UC Medications - No data to display  Initial Impression / Assessment and Plan / UC Course  I have reviewed the triage vital signs and the nursing notes.  Pertinent labs & imaging results that were available during  my care of the patient were reviewed by me and considered in my medical decision making (see chart for details).     Rapid flu and COVID-negative, vitals and exam reassuring today, suspicious for viral respiratory infection causing a middle ear effusion on the right.  Treat with short course of prednisone, Flonase, Coricidin HBP and supportive home care.  Return for worsening symptoms.  Final Clinical Impressions(s) / UC Diagnoses   Final diagnoses:  Viral URI with cough  Acute middle ear effusion, right   Discharge Instructions   None    ED Prescriptions     Medication Sig Dispense Auth. Provider   predniSONE (DELTASONE) 50 MG tablet Take 1 tab daily with breakfast for 3 days 3 tablet Particia Nearing, PA-C   fluticasone [employer]) 50 MCG/ACT nasal spray Place 1 spray into both nostrils 2 (two) times daily. 16 g Particia Nearing, [place]   DM-APAP-CPM (CORICIDIN HBP) 10-325-2 MG TABS Take 1 tablet by mouth 3 (three) times daily as needed. 20 tablet Particia Nearing, [place]   fluconazole (DIFLUCAN) 150 MG tablet Take 1 tablet (150 mg total) by mouth once for 1 dose. 1 tablet Particia Nearing, [place]      PDMP not reviewed this encounter.   Particia Nearing, [place] 05/24/23 1616

## 2023-05-24 NOTE — Telephone Encounter (Signed)
Forms completed and faxed.

## 2023-06-01 ENCOUNTER — Ambulatory Visit (INDEPENDENT_AMBULATORY_CARE_PROVIDER_SITE_OTHER): Payer: BC Managed Care – PPO | Admitting: Orthopedic Surgery

## 2023-06-01 ENCOUNTER — Encounter: Payer: Self-pay | Admitting: Orthopedic Surgery

## 2023-06-01 DIAGNOSIS — M1711 Unilateral primary osteoarthritis, right knee: Secondary | ICD-10-CM

## 2023-06-01 DIAGNOSIS — M23321 Other meniscus derangements, posterior horn of medial meniscus, right knee: Secondary | ICD-10-CM

## 2023-06-01 DIAGNOSIS — Z9889 Other specified postprocedural states: Secondary | ICD-10-CM

## 2023-06-01 NOTE — Progress Notes (Signed)
Chief Complaint  Patient presents with   Post-op Follow-up    Knee pain right 04/17/23   Encounter Diagnoses  Name Primary?   s//p right knee arthroscopy 04/17/23 Yes   Primary osteoarthritis of right knee    Derangement of posterior horn of medial meniscus of right knee    C/O mild discomfort occasionally  Right Knee Exam   Muscle Strength  The patient has normal right knee strength.  Range of Motion  Extension:  normal  Flexion:  normal   Tests  McMurray:  Medial - negative  Drawer:  Anterior - negative    Posterior - negative  Other  Erythema: absent Scars: present Sensation: normal Pulse: present Swelling: none Effusion: no effusion present     A/P  Doing well , may return to work dec 2 after more strengthening; Use ice and ibuprofen, before and after work as needed  Return as needed

## 2023-06-01 NOTE — Patient Instructions (Signed)
Oow note to return on December 2 nd

## 2023-06-04 DIAGNOSIS — K219 Gastro-esophageal reflux disease without esophagitis: Secondary | ICD-10-CM | POA: Diagnosis not present

## 2023-06-04 DIAGNOSIS — R1084 Generalized abdominal pain: Secondary | ICD-10-CM | POA: Diagnosis not present

## 2023-06-04 DIAGNOSIS — K581 Irritable bowel syndrome with constipation: Secondary | ICD-10-CM | POA: Diagnosis not present

## 2023-06-04 DIAGNOSIS — K5909 Other constipation: Secondary | ICD-10-CM | POA: Diagnosis not present

## 2023-06-08 DIAGNOSIS — K635 Polyp of colon: Secondary | ICD-10-CM | POA: Diagnosis not present

## 2023-06-08 DIAGNOSIS — Z09 Encounter for follow-up examination after completed treatment for conditions other than malignant neoplasm: Secondary | ICD-10-CM | POA: Diagnosis not present

## 2023-06-08 DIAGNOSIS — Z1211 Encounter for screening for malignant neoplasm of colon: Secondary | ICD-10-CM | POA: Diagnosis not present

## 2023-06-08 DIAGNOSIS — K219 Gastro-esophageal reflux disease without esophagitis: Secondary | ICD-10-CM | POA: Diagnosis not present

## 2023-06-08 DIAGNOSIS — D125 Benign neoplasm of sigmoid colon: Secondary | ICD-10-CM | POA: Diagnosis not present

## 2023-06-12 ENCOUNTER — Other Ambulatory Visit: Payer: Self-pay | Admitting: Orthopedic Surgery

## 2023-08-27 DIAGNOSIS — T148XXA Other injury of unspecified body region, initial encounter: Secondary | ICD-10-CM | POA: Diagnosis not present

## 2023-08-27 DIAGNOSIS — T7840XA Allergy, unspecified, initial encounter: Secondary | ICD-10-CM | POA: Diagnosis not present

## 2023-08-27 DIAGNOSIS — I10 Essential (primary) hypertension: Secondary | ICD-10-CM | POA: Diagnosis not present

## 2023-08-27 DIAGNOSIS — Z6838 Body mass index (BMI) 38.0-38.9, adult: Secondary | ICD-10-CM | POA: Diagnosis not present

## 2023-09-10 DIAGNOSIS — K5909 Other constipation: Secondary | ICD-10-CM | POA: Diagnosis not present

## 2023-09-10 DIAGNOSIS — K581 Irritable bowel syndrome with constipation: Secondary | ICD-10-CM | POA: Diagnosis not present

## 2023-09-10 DIAGNOSIS — R1084 Generalized abdominal pain: Secondary | ICD-10-CM | POA: Diagnosis not present

## 2023-09-10 DIAGNOSIS — K219 Gastro-esophageal reflux disease without esophagitis: Secondary | ICD-10-CM | POA: Diagnosis not present

## 2023-10-14 ENCOUNTER — Other Ambulatory Visit: Payer: Self-pay | Admitting: Orthopedic Surgery

## 2023-10-30 ENCOUNTER — Ambulatory Visit: Payer: BC Managed Care – PPO | Admitting: Nurse Practitioner

## 2023-10-30 ENCOUNTER — Encounter: Payer: Self-pay | Admitting: Nurse Practitioner

## 2023-10-30 VITALS — BP 130/90 | HR 83 | Temp 98.1°F | Ht 68.0 in | Wt 255.0 lb

## 2023-10-30 DIAGNOSIS — K449 Diaphragmatic hernia without obstruction or gangrene: Secondary | ICD-10-CM

## 2023-10-30 DIAGNOSIS — R7303 Prediabetes: Secondary | ICD-10-CM | POA: Insufficient documentation

## 2023-10-30 DIAGNOSIS — Z0000 Encounter for general adult medical examination without abnormal findings: Secondary | ICD-10-CM | POA: Diagnosis not present

## 2023-10-30 DIAGNOSIS — Z2821 Immunization not carried out because of patient refusal: Secondary | ICD-10-CM

## 2023-10-30 DIAGNOSIS — R1084 Generalized abdominal pain: Secondary | ICD-10-CM | POA: Diagnosis not present

## 2023-10-30 DIAGNOSIS — I10 Essential (primary) hypertension: Secondary | ICD-10-CM | POA: Diagnosis not present

## 2023-10-30 DIAGNOSIS — R748 Abnormal levels of other serum enzymes: Secondary | ICD-10-CM | POA: Diagnosis not present

## 2023-10-30 DIAGNOSIS — Z13220 Encounter for screening for lipoid disorders: Secondary | ICD-10-CM

## 2023-10-30 DIAGNOSIS — Z79899 Other long term (current) drug therapy: Secondary | ICD-10-CM

## 2023-10-30 LAB — POCT URINALYSIS DIPSTICK
Bilirubin, UA: NEGATIVE
Glucose, UA: NEGATIVE
Ketones, UA: NEGATIVE
Leukocytes, UA: NEGATIVE
Nitrite, UA: NEGATIVE
Protein, UA: NEGATIVE
Spec Grav, UA: >=1.03 — AB (ref 1.010–1.025)
Urobilinogen, UA: 0.2 U/dL
pH, UA: 5.5 (ref 5.0–8.0)

## 2023-10-30 NOTE — Assessment & Plan Note (Signed)
 Will refer to general surgery to evaluate if need for repair, small hiatal hernia seen in 2023 on CT imaging and recent endoscopy

## 2023-10-30 NOTE — Assessment & Plan Note (Signed)
 She would like to see a GYN due to history of fibroids, she has had a hysterectomy but still has one ovary. She is concerned because she continues to have stomach issues. She has had a full work up with GI. Some of her stomach issues may be related to stress.

## 2023-10-30 NOTE — Assessment & Plan Note (Signed)
 Blood pressure is better controlled, still elevated but improved with repeat. She has just got off work. She is to take her spironoloactone in am with metoprolol and olmsartan and metoprolol in pm

## 2023-10-30 NOTE — Assessment & Plan Note (Signed)
 Declines shingrix, educated on disease process and is aware if he changes his mind to notify office

## 2023-10-30 NOTE — Progress Notes (Signed)
 Mckenzie Anderson,Mckenzie Anderson, CMA,acting as a Neurosurgeon for [employer], FNP.,have documented all relevant documentation on the behalf of Mckenzie Felts, FNP,as directed by  Mckenzie Felts, FNP while in the presence of Mckenzie Felts, FNP.  Subjective:    Patient ID: Mckenzie Anderson , female    DOB: [birth year] , 53 y.o.   MRN: [id number]  Chief Complaint  Patient presents with   Annual Exam    HPI  Patient presents today for a physical. Patient reports compliance with her meds. Patient denies having chest pain, sob or headaches at this time. Patient would like a referral to a GYN.       Past Medical History:  Diagnosis Date   Asthma    Diverticulosis    GERD (gastroesophageal reflux disease)    Gross hematuria    Heartburn    Hypertension    Migraines    Rupture of right distal biceps tendon 01/11/2018     Family History  Problem Relation Age of Onset   Hypertension Mother    Diabetes Mother    Gout Mother    Cancer Father    Kidney cancer Father    Seizures Maternal Aunt    Cancer Maternal Grandmother    Breast cancer Maternal Grandmother 69   Hypertension Maternal Grandfather    Obesity Maternal Grandfather    Diabetes Paternal Grandmother    Diabetes Paternal Grandfather    Cancer Other    Seizures Other        Grandmother's sister   Migraines Neg Hx    Bladder Cancer Neg Hx      Current Outpatient Medications:    Cholecalciferol (VITAMIN D3) 1.25 MG (50000 UT) CAPS, Take 50,000 Units by mouth every Monday., Disp: , Rfl:    DM-APAP-CPM (CORICIDIN HBP) 10-325-2 MG TABS, Take 1 tablet by mouth 3 (three) times daily as needed., Disp: 20 tablet, Rfl: 0   doxepin (SINEQUAN) 10 MG capsule, Take 20 mg by mouth at bedtime as needed (spasms.)., Disp: , Rfl:    fluticasone (FLONASE) 50 MCG/ACT nasal spray, Place 1 spray into both nostrils daily as needed for allergies. , Disp: , Rfl:    ibuprofen (ADVIL) 800 MG tablet, TAKE 1 TABLET BY MOUTH EVERY 8 HOURS AS NEEDED. TAKE WITH FOOD  TO PREVENT GI UPSET, Disp: 90 tablet, Rfl: 0   lubiprostone (AMITIZA) 24 MCG capsule, Take 24 mcg by mouth 2 (two) times daily as needed (constipation.)., Disp: , Rfl:    metoprolol tartrate (LOPRESSOR) 25 MG tablet, TAKE 1 TABLET BY MOUTH TWICE A DAY, Disp: 180 tablet, Rfl: 1   OIL OF OREGANO PO, Take 2 capsules by mouth every evening., Disp: , Rfl:    olmesartan (BENICAR) 20 MG tablet, TAKE 1 TABLET BY MOUTH EVERY DAY, Disp: 90 tablet, Rfl: 1   pantoprazole (PROTONIX) 40 MG tablet, Take 1 tablet (40 mg total) by mouth 2 (two) times daily. (Patient taking differently: Take 80 mg by mouth every evening.), Disp: 28 tablet, Rfl: 0   spironolactone (ALDACTONE) 25 MG tablet, Take 1 tablet (25 mg total) by mouth daily., Disp: 90 tablet, Rfl: 1   fluconazole (DIFLUCAN) 150 MG tablet, Take 150 mg by mouth once. (Patient not taking: Reported on 10/30/2023), Disp: , Rfl:    phentermine (ADIPEX-P) 37.5 MG tablet, Take 37.5 mg by mouth daily before breakfast. (Patient not taking: Reported on 10/30/2023), Disp: , Rfl:    Allergies  Allergen Reactions   Sulfa Antibiotics Hives and Itching  The patient states she is status post hysterectomy.  No LMP recorded. Patient has had a hysterectomy.   Negative for Dysmenorrhea and Negative for Menorrhagia. Negative for: breast discharge, breast lump(s), breast pain and breast self exam. Associated symptoms include abnormal vaginal bleeding. Pertinent negatives include abnormal bleeding (hematology), anxiety, decreased libido, depression, difficulty falling sleep, dyspareunia, history of infertility, nocturia, sexual dysfunction, sleep disturbances, urinary incontinence, urinary urgency, vaginal discharge and vaginal itching. Diet regular. The patient states her exercise level is none.   The patient's tobacco use is:  Social History   Tobacco Use  Smoking Status Former   Current packs/day: 0.00   Average packs/day: 0.5 packs/day for 8.0 years (4.0 ttl pk-yrs)    Types: Cigarettes   Start date: 12/19/2013   Quit date: 12/19/2021   Years since quitting: 1.8  Smokeless Tobacco Never  Tobacco Comments   She stopped since her last visit    She has been exposed to passive smoke. The patient's alcohol use is:  Social History   Substance and Sexual Activity  Alcohol Use No    Review of Systems  Constitutional: Negative.   HENT: Negative.    Eyes: Negative.   Respiratory: Negative.    Cardiovascular: Negative.   Gastrointestinal: Negative.   Endocrine: Negative.   Genitourinary: Negative.   Musculoskeletal: Negative.   Skin: Negative.   Allergic/Immunologic: Negative.   Neurological: Negative.   Hematological: Negative.   Psychiatric/Behavioral: Negative.       Today's Vitals   10/30/23 0853 10/30/23 0908  BP: (!) 140/90 (!) 130/90  Pulse: 83   Temp: 98.1 F (36.7 C)   TempSrc: Oral   Weight: 255 lb (115.7 kg)   Height: 5\' 8"  (1.727 m)   PainSc: 0-No pain    Body mass index is 38.77 kg/m.  Wt Readings from Last 3 Encounters:  10/30/23 255 lb (115.7 kg)  04/17/23 259 lb 14.8 oz (117.9 kg)  04/11/23 260 lb (117.9 kg)     Objective:  Physical Exam Vitals and nursing note reviewed.  Constitutional:      General: She is not in acute distress.    Appearance: Normal appearance. She is well-developed. She is obese.  HENT:     Head: Normocephalic and atraumatic.     Right Ear: Hearing, tympanic membrane, ear canal and external ear normal. There is no impacted cerumen.     Left Ear: Hearing, tympanic membrane, ear canal and external ear normal. There is no impacted cerumen.     Nose: Nose normal.     Mouth/Throat:     Mouth: Mucous membranes are moist.  Eyes:     General: Lids are normal.     Extraocular Movements: Extraocular movements intact.     Conjunctiva/sclera: Conjunctivae normal.     Pupils: Pupils are equal, round, and reactive to light.     Funduscopic exam:    Right eye: No papilledema.        Left eye: No  papilledema.  Neck:     Thyroid: No thyroid mass.     Vascular: No carotid bruit.  Cardiovascular:     Rate and Rhythm: Normal rate and regular rhythm.     Pulses: Normal pulses.     Heart sounds: Normal heart sounds. No murmur heard. Pulmonary:     Effort: Pulmonary effort is normal. No respiratory distress.     Breath sounds: Normal breath sounds. No wheezing.  Chest:     Chest wall: No mass.  Breasts:  Tanner Score is 5.     Right: Normal. No mass or tenderness.     Left: Normal. No mass or tenderness.  Abdominal:     General: Abdomen is flat. Bowel sounds are normal. There is no distension.     Palpations: Abdomen is soft.     Tenderness: There is no abdominal tenderness.  Genitourinary:    Rectum: Guaiac result negative.  Musculoskeletal:        General: No swelling. Normal range of motion.     Cervical back: Full passive range of motion without pain, normal range of motion and neck supple.     Right lower leg: No edema.     Left lower leg: No edema.  Lymphadenopathy:     Upper Body:     Right upper body: No supraclavicular, axillary or pectoral adenopathy.     Left upper body: No supraclavicular, axillary or pectoral adenopathy.  Skin:    General: Skin is warm and dry.     Capillary Refill: Capillary refill takes less than 2 seconds.  Neurological:     General: No focal deficit present.     Mental Status: She is alert and oriented to person, place, and time.     Cranial Nerves: No cranial nerve deficit.     Sensory: No sensory deficit.  Psychiatric:        Mood and Affect: Mood normal.        Behavior: Behavior normal.        Thought Content: Thought content normal.        Judgment: Judgment normal.        10/30/2023    8:54 AM 10/26/2022    3:49 PM 12/19/2021    7:57 PM 08/12/2021    2:21 PM  Depression screen PHQ 2/9  Decreased Interest 0 0 0 0  Down, Depressed, Hopeless 0 0 0 0  PHQ - 2 Score 0 0 0 0  Altered sleeping 0  0   Tired, decreased energy 0  0    Change in appetite 0  0   Feeling bad or failure about yourself  0  0   Trouble concentrating 0  0   Moving slowly or fidgety/restless 0  0   Suicidal thoughts 0  0   PHQ-9 Score 0  0   Difficult doing work/chores Not difficult at all       Assessment And Plan:     Encounter for health maintenance examination Assessment & Plan: Behavior modifications discussed and diet history reviewed.   Pt will continue to exercise regularly and modify diet with low GI, plant based foods and decrease intake of processed foods.  Recommend intake of daily multivitamin, Vitamin D, and calcium.  Recommend mammogram and colonoscopy for preventive screenings, as well as recommend immunizations that include influenza, TDAP, and Shingles   Orders: -     CBC  Encounter for screening for lipid disorder -     Lipid panel  Herpes zoster vaccination declined Assessment & Plan: Declines shingrix, educated on disease process and is aware if he changes his mind to notify office    Essential hypertension Assessment & Plan: Blood pressure is better controlled, still elevated but improved with repeat. She has just got off work. She is to take her spironoloactone in am with metoprolol and olmsartan and metoprolol in pm  Orders: -     POCT urinalysis dipstick -     EKG 12-Lead -     CMP14+EGFR -  Microalbumin / creatinine urine ratio  Prediabetes Assessment & Plan: HgbA1c was 6 at last visit, will check A1c today. Continue focusing on healthy diet and regular exercise.   Orders: -     Hemoglobin A1c  Generalized abdominal pain Assessment & Plan: She would like to see a GYN due to history of fibroids, she has had a hysterectomy but still has one ovary. She is concerned because she continues to have stomach issues. She has had a full work up with GI. Some of her stomach issues may be related to stress.   Orders: -     Ambulatory referral to Obstetrics / Gynecology  Other long term (current)  drug therapy  Hiatal hernia Assessment & Plan: Will refer to general surgery to evaluate if need for repair, small hiatal hernia seen in 2023 on CT imaging and recent endoscopy  Orders: -     Ambulatory referral to General Surgery     Return for 1 year physical. Patient was given opportunity to ask questions. Patient verbalized understanding of the plan and was able to repeat key elements of the plan. All questions were answered to their satisfaction.   Susanna Epley, FNP  Mckenzie Anderson, Susanna Epley, FNP, have reviewed all documentation for this visit. The documentation on 10/30/23 for the exam, diagnosis, procedures, and orders are all accurate and complete.

## 2023-10-30 NOTE — Assessment & Plan Note (Signed)

## 2023-10-30 NOTE — Assessment & Plan Note (Signed)
 HgbA1c was 6 at last visit, will check A1c today. Continue focusing on healthy diet and regular exercise.

## 2023-10-30 NOTE — Patient Instructions (Signed)

## 2023-10-31 LAB — LIPID PANEL
Chol/HDL Ratio: 2.6 ratio (ref 0.0–4.4)
Cholesterol, Total: 187 mg/dL (ref 100–199)
HDL: 73 mg/dL (ref >39)
LDL Chol Calc (NIH): 100 mg/dL — ABNORMAL HIGH (ref 0–99)
Triglycerides: 76 mg/dL (ref 0–149)
VLDL Cholesterol Cal: 14 mg/dL (ref 5–40)

## 2023-10-31 LAB — CMP14+EGFR
ALT: 19 IU/L (ref 0–32)
AST: 20 IU/L (ref 0–40)
Albumin: 4.4 g/dL (ref 3.8–4.9)
Alkaline Phosphatase: 249 IU/L — ABNORMAL HIGH (ref 44–121)
BUN/Creatinine Ratio: 18 (ref 9–23)
BUN: 15 mg/dL (ref 6–24)
Bilirubin Total: 0.3 mg/dL (ref 0.0–1.2)
CO2: 22 mmol/L (ref 20–29)
Calcium: 9.7 mg/dL (ref 8.7–10.2)
Chloride: 104 mmol/L (ref 96–106)
Creatinine, Ser: 0.83 mg/dL (ref 0.57–1.00)
Globulin, Total: 2.8 g/dL (ref 1.5–4.5)
Glucose: 87 mg/dL (ref 70–99)
Potassium: 4.3 mmol/L (ref 3.5–5.2)
Sodium: 142 mmol/L (ref 134–144)
Total Protein: 7.2 g/dL (ref 6.0–8.5)
eGFR: 84 mL/min/{1.73_m2} (ref >59)

## 2023-10-31 LAB — CBC
Hematocrit: 39.5 % (ref 34.0–46.6)
Hemoglobin: 13.3 g/dL (ref 11.1–15.9)
MCH: 29.8 pg (ref 26.6–33.0)
MCHC: 33.7 g/dL (ref 31.5–35.7)
MCV: 88 fL (ref 79–97)
Platelets: 348 10*3/uL (ref 150–450)
RBC: 4.47 x10E6/uL (ref 3.77–5.28)
RDW: 13.5 % (ref 11.7–15.4)
WBC: 8 10*3/uL (ref 3.4–10.8)

## 2023-10-31 LAB — HEMOGLOBIN A1C
Est. average glucose Bld gHb Est-mCnc: 131 mg/dL
Hgb A1c MFr Bld: 6.2 % — ABNORMAL HIGH (ref 4.8–5.6)

## 2023-10-31 LAB — MICROALBUMIN / CREATININE URINE RATIO
Creatinine, Urine: 168.5 mg/dL
Microalb/Creat Ratio: 3 mg/g{creat} (ref 0–29)
Microalbumin, Urine: 4.8 ug/mL

## 2023-11-02 LAB — SPECIMEN STATUS REPORT

## 2023-11-02 LAB — ALKALINE PHOSPHATASE: Alkaline Phosphatase: 257 IU/L — ABNORMAL HIGH (ref 44–121)

## 2023-11-03 ENCOUNTER — Other Ambulatory Visit: Payer: Self-pay | Admitting: Nurse Practitioner

## 2023-11-03 DIAGNOSIS — I10 Essential (primary) hypertension: Secondary | ICD-10-CM

## 2023-11-09 ENCOUNTER — Encounter: Payer: Self-pay | Admitting: Nurse Practitioner

## 2023-12-06 DIAGNOSIS — K449 Diaphragmatic hernia without obstruction or gangrene: Secondary | ICD-10-CM | POA: Diagnosis not present

## 2023-12-06 DIAGNOSIS — I10 Essential (primary) hypertension: Secondary | ICD-10-CM | POA: Diagnosis not present

## 2023-12-06 DIAGNOSIS — R1319 Other dysphagia: Secondary | ICD-10-CM | POA: Diagnosis not present

## 2023-12-06 DIAGNOSIS — K219 Gastro-esophageal reflux disease without esophagitis: Secondary | ICD-10-CM | POA: Diagnosis not present

## 2023-12-06 NOTE — Progress Notes (Signed)
 REFERRING PHYSICIAN:  Georgina Speaks, NP PROVIDER:  DEWARD PURCHASE STECHSCHULTE, MD MRN: [id number] DOB: 02-Oct-1970 DATE OF ENCOUNTER: 12/06/2023 Subjective     Chief Complaint: New Consultation (hiatal hernia)   History of Present Illness: Mckenzie Anderson is a 53 y.o. female who is seen today as an office consultation for evaluation of New Consultation (hiatal hernia)    History of Present Illness Mckenzie Anderson is a 53 year old female with a hiatal hernia who presents with symptoms of acid reflux and dysphagia.  She experiences severe acid reflux and difficulty swallowing, particularly when consuming rice. The sensation is described as 'struggling to breathe' and feeling as though the food 'stops' and then moves slowly. These symptoms occur intermittently, with heartburn presenting 'every now and then'.  She has a known hiatal hernia and takes pantoprazole  as needed, especially when using ibuprofen  for her torn meniscus, to protect her stomach. She also uses [employer] for pain relief but does not take ibuprofen  frequently.  No acid taste in the back of her throat and no chronic cough, though she sometimes coughs after waking up. She does not use tobacco.  Her past medical history includes a torn meniscus and a history of gallbladder surgery, during which her esophagus was stretched, providing some relief at the time. She also mentions a partial hysterectomy and previous colonoscopies due to inadequate cleanouts, with the last one being successful after using Linzess.  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 She has been trying to lose weight since having her daughter 18 years ago, but finds it challenging. She reports a BMI of 38 and experiences fatigue, attributing it to her work schedule and the hot working environment.     Review of Systems: A complete review of  systems was obtained from the patient.  I have reviewed this information and discussed as appropriate with the patient.  See HPI as well for other ROS.  ROS   Medical History: Past Medical History:  Diagnosis Date  . Arthritis   . Calculus of kidney   . Cardiomegaly   . Carpal tunnel syndrome   . Dermatitis, unspecified   . Fibroid   . GERD (gastroesophageal reflux disease)   . Hypertension   . Nonrheumatic mitral (valve) insufficiency   . Scoliosis     Patient Active Problem List  Diagnosis  . Abdominal bloating  . Rupture of right distal biceps tendon  . Peripheral neuritis  . Abdominal pain, RLQ (right lower quadrant)  . Right upper quadrant abdominal pain  . Vaginal discharge  . Gastroesophageal reflux disease without esophagitis  . Chronic heartburn  . Chronic constipation  . Hx of adenomatous colonic polyps  . Elevated alkaline phosphatase level    Past Surgical History:  Procedure Laterality Date  . HYSTERECTOMY  2013   Fibroids   . HYSTEROSCOPY  2019  . COLONOSCOPY  09/17/2017   Tubular adenoma of the colon/Repeat 54yrs/TKT  . EGD  09/17/2017   Gastritis/Esophagitis/Negative for H. Pylori/No Repeat/TKT  . COLONOSCOPY  08/18/2021   Tubular adenoma/Poor colon prep/Repeat 6-12 months/CTL  . EGD  08/18/2021   Gastritis/Repeat as needed/CTL  . CHOLECYSTECTOMY    . COLONOSCOPY    . ENDOSCOPIC CARPAL TUNNEL RELEASE    . TUBAL LIGATION    . wisdom teeth       Allergies  Allergen Reactions  . Sulfa (Sulfonamide Antibiotics) Hives  .  Flagyl  [Metronidazole ] Other (See Comments)    Current Outpatient Medications on File Prior to Visit  Medication Sig Dispense Refill  . cholecalciferol, vitamin D3, 50,000 unit capsule Take 50,000 Units by mouth once daily    . desloratadine (CLARINEX) 5 mg tablet Take 5 mg by mouth once daily    . fluticasone  propionate (FLONASE ) 50 mcg/actuation nasal spray Place 1 spray into both nostrils once daily    . lubiprostone  (AMITIZA) 24 MCG capsule Take 24 mcg by mouth    . olmesartan  (BENICAR ) 20 MG tablet Take 1 tablet by mouth once daily    . pantoprazole  (PROTONIX ) 40 MG DR tablet TAKE 1 TABLET (40 MG TOTAL) BY MOUTH ONCE DAILY TAKE 30 MINUTES BEFORE BREAKFAST. 90 tablet 1  . amLODIPine (NORVASC) 10 MG tablet Take 10 mg by mouth once daily. (Patient not taking: Reported on 12/06/2023)    . clindamycin-benzoyl peroxide (BENZACLIN) 1-5 % topical gel Apply topically once daily (Patient not taking: Reported on 12/06/2023)    . cyanocobalamin  (VITAMIN B12) 1,000 mcg/mL injection Inject into the muscle every 14 (fourteen) days (Patient not taking: Reported on 12/06/2023)    . ferrous sulfate 325 (65 FE) MG EC tablet Take 325 mg by mouth once daily (Patient not taking: Reported on 12/06/2023)    . hydroCHLOROthiazide (HYDRODIURIL) 25 MG tablet  (Patient not taking: Reported on 12/06/2023)    . ibuprofen  (MOTRIN ) 800 MG tablet Take 800 mg by mouth as needed (Patient not taking: Reported on 12/06/2023)    . linaCLOtide (LINZESS) 145 mcg capsule Take 72 mcg by mouth once daily (Patient not taking: Reported on 12/06/2023)    . losartan  (COZAAR ) 100 MG tablet Take 100 mg by mouth once daily (Patient not taking: Reported on 12/06/2023)    . meloxicam (MOBIC) 15 MG tablet Take 15 mg by mouth once daily as needed (Patient not taking: Reported on 12/06/2023)    . metoprolol  tartrate (LOPRESSOR ) 25 MG tablet Take 25 mg by mouth 2 (two) times daily    . nortriptyline (PAMELOR) 10 MG capsule Take 1 capsule (10 mg total) by mouth at bedtime 30 capsule 0  . potassium chloride  (KLOR-CON ) 10 MEQ ER tablet Take 10 mEq by mouth once daily    . sodium, potassium, and magnesium (SUPREP) oral solution Take 1 Bottle by mouth as directed One kit contains 2 bottles.  Take both bottles at the times instructed by your provider. (Patient not taking: Reported on 12/06/2023) 354 mL 0  . spironolactone  (ALDACTONE ) 25 MG tablet Take 25 mg by mouth once daily     . tamsulosin  (FLOMAX ) 0.4 mg capsule Take 0.4 mg by mouth as needed (Patient not taking: Reported on 12/06/2023)     No current facility-administered medications on file prior to visit.    Family History  Problem Relation Age of Onset  . Lung cancer Father   . Cervical cancer Maternal Aunt 79       2014  . Breast cancer Maternal Grandmother   . Stroke Maternal Grandfather   . Diabetes Paternal Grandmother   . Diabetes Paternal Grandfather   . High blood pressure (Hypertension) Mother   . Gout Mother   . Diabetes Mother   . Arthritis Mother   . Stroke Daughter 20       Left basal artery blocked   . Diabetes Sister   . Rashes / Skin problems Sister      Social History   Tobacco Use  Smoking Status Former  .  Current packs/day: 0.00  . Types: Cigarettes  Smokeless Tobacco Never     Social History   Socioeconomic History  . Marital status: Divorced  Tobacco Use  . Smoking status: Former    Current packs/day: 0.00    Types: Cigarettes  . Smokeless tobacco: Never  Vaping Use  . Vaping status: Never Used  Substance and Sexual Activity  . Alcohol use: No    Alcohol/week: 0.0 standard drinks of alcohol  . Drug use: No  . Sexual activity: Yes    Partners: Male    Birth control/protection: Surgical   Social Drivers of Health    [facility]   Social Network  Housing Stability: Unknown (12/06/2023)   Housing Stability Vital Sign   . Homeless in the Last Year: No    Objective:   Vitals:   12/06/23 0906  BP: (!) 156/98  Pulse: 65  Temp: 36.6 C (97.9 F)  SpO2: 93%  Weight: (!) 114.1 kg (251 lb 9.6 oz)  Height: 172.7 cm (5' 8)  PainSc: 0-No pain    Body mass index is 38.26 kg/m.  Physical Exam   General appearance - Consistent with stated age. Normal posture. Voice Normal. Mental status - alert and oriented Integumentary - No rash or lesion on limited exam Head - Normocephalic, atraumatic Face - Strength and tone intact Eyes -  extraocular movement intact, sclera anicteric Chest - quiet, even and easy respiratory effort with no use of accessory muscles Neurological - able to articulate well with normal speech/language, rate, volume and coherence. Mood/affect - normal Judgement and insight -  insight is appropriate concerning matters relevant to self and the patient displays appropriate judgment regarding every day activities. Thought Processes/Cognitive Function - aware of current events. Musculoskeletal - strength symmetrical throughout, no deformity Abdomen - Soft, nontender  Physical Exam MEASUREMENTS: Weight- 250, BMI- 38.0.   Labs, Imaging and Diagnostic Testing: CT Abd/Pel 06/18/22 IMPRESSION: 1. No acute intra-abdominal or pelvic pathology. 2. Fatty liver. 3. Mild sigmoid diverticulosis. No bowel obstruction. Normal appendix. Stomach/Bowel: There is a small hiatal hernia. There is moderate stool throughout the colon. Mild sigmoid diverticulosis without active inflammatory changes. There is no bowel obstruction or active inflammation. The appendix is normal.  EGD 08/18/21 Dr, Maryruth     Assessment and Plan:     Diagnoses and all orders for this visit:  Hiatal hernia  Gastroesophageal reflux disease without esophagitis  Esophageal dysphagia  Hypertension as manifestation of blood transfusion reaction, initial encounter     Assessment & Plan  Ms. Sconyers presented with a symptomatic hiatal hernia complaining of both heartburn and dysphagia symptoms.  She has particular difficulty swallowing rice.  She takes Protonix  for acid reflux especially when she is taking nonsteroidal anti-inflammatory medications for her arthritis issues.  She has a BMI of 38.  She has hypertension.  We discussed treatment options for her hiatal hernia including hiatal hernia repair alone versus hiatal hernia repair combined with gastric bypass.  She is interested in hiatal hernia repair with gastric bypass.  Will  provide additional information on the gastric bypass and information session.  We will start the preoperative pathway for gastric bypass with hiatal hernia repair.  Additional notes below:   Hiatal hernia with gastroesophageal reflux disease (GERD) Chronic hiatal hernia causing significant GERD symptoms and dysphagia, particularly with rice. Symptoms include intermittent heartburn and difficulty swallowing. Surgical options discussed include hiatal hernia repair and gastric bypass. Hiatal hernia repair involves pulling the stomach down and suturing  the diaphragm, with potential fundoplication. Gastric bypass involves creating a small stomach pouch and rerouting the intestines, reducing acid reflux and aiding weight loss. Risks include hernia recurrence, especially with higher BMI, and complications such as ulcers with NSAID use. Preoperative testing is needed to confirm diagnosis and suitability for surgery. Surgery is recommended if symptoms are severe and not controlled with medications or lifestyle changes. Hiatal hernia repair has a 30-40% recurrence rate at 10-15 years, while gastric bypass has a higher risk but offers weight loss benefits. - Refer to bariatric surgery info session - Coordinate preoperative testing including pH probe and manometry if proceeding with hiatal hernia repair - Discuss potential gastric bypass surgery with her - Advise on lifestyle changes and medication management for symptom control  Obesity BMI of 38, contributing to increased risk of hiatal hernia recurrence. Weight loss options discussed include lifestyle changes, medications, and surgical intervention. Gastric bypass surgery considered due to dual benefit of weight loss and GERD symptom control. Risks of gastric bypass include lifestyle changes and potential for ulcers with NSAID use. Emphasized the need to avoid NSAIDs post-surgery to prevent ulcer formation. - Discuss weight loss options including diet, exercise,  and medications like Ozempic or Wegovy - Consider gastric bypass surgery for weight loss and GERD management - Advise on avoiding NSAIDs post-gastric bypass  Torn meniscus Torn meniscus currently managed with NSAIDs. Discussed risks of NSAID use, particularly in context of potential gastric bypass surgery. Recommended alternative pain management strategies to avoid NSAID-related complications post-surgery. - Advise on using Tylenol  instead of NSAIDs for pain management     PAUL JEFFREY STECHSCHULTE, MD    This note has been created using automated tools and reviewed for accuracy by PAUL JEFFREY STECHSCHULTE.

## 2024-01-15 DIAGNOSIS — M25511 Pain in right shoulder: Secondary | ICD-10-CM | POA: Diagnosis not present

## 2024-01-17 ENCOUNTER — Ambulatory Visit: Admitting: Obstetrics & Gynecology

## 2024-01-17 ENCOUNTER — Encounter: Payer: Self-pay | Admitting: Obstetrics & Gynecology

## 2024-01-17 VITALS — BP 180/106 | HR 69 | Wt 258.0 lb

## 2024-01-17 DIAGNOSIS — Z1331 Encounter for screening for depression: Secondary | ICD-10-CM | POA: Diagnosis not present

## 2024-01-17 DIAGNOSIS — R1084 Generalized abdominal pain: Secondary | ICD-10-CM | POA: Diagnosis not present

## 2024-01-17 NOTE — Progress Notes (Signed)
 GYNECOLOGY OFFICE VISIT NOTE  History:  Mckenzie Anderson is a 53 y.o. F s/p total vaginal hysterectomy in 2013 for AUB and fibroids, and multiple health issues here today for evaluation of generalized abdominal pain for several weeks.  Has ongoing evaluation by her PCP, History of irritable bowel syndrome, diverticulosis and abdominal pain in the past.  She had several scans after her hysterectomy for abdominal pain, all showed normal adnexa and surgically absent uterus.  She denies any abnormal vaginal discharge, bleeding or other concerns.  Past Medical History:  Diagnosis Date   Asthma    Diverticulosis    Gastroesophageal reflux disease without esophagitis 07/09/2021   GERD (gastroesophageal reflux disease)    Gross hematuria    Heartburn    Hx of adenomatous colonic polyps 09/06/2021   Hypertension    Irritable bowel syndrome with constipation 06/14/2022   Lateral epicondylitis of right elbow 03/08/2022   Migraines    Peripheral neuritis 01/11/2018   Rupture of right distal biceps tendon 01/11/2018   Tear of medial meniscus of right knee, current 04/17/2023    Past Surgical History:  Procedure Laterality Date   CARPAL TUNNEL RELEASE     CHOLECYSTECTOMY     KNEE ARTHROSCOPY WITH MEDIAL MENISECTOMY Right 04/17/2023   Procedure: KNEE ARTHROSCOPY WITH MEDIAL MENISCECTOMY;  Surgeon: Margrette Taft BRAVO, MD;  Location: [facility];  Service: Orthopedics;  Laterality: Right;   TOTAL VAGINAL HYSTERECTOMY  08/14/2011   Both ovaries remain in place    The following portions of the patient's history were reviewed and updated as appropriate: allergies, current medications, past family history, past medical history, past social history, past surgical history and problem list.   Health Maintenance:   Normal mammogram on 05/17/2023.   Review of Systems:  Pertinent items noted in HPI and remainder of comprehensive ROS otherwise negative.  Physical Exam:  BP (!) 180/106 (Cuff Size: Large)    Pulse 69   Wt 258 lb (117 kg)   BMI 39.23 kg/m  CONSTITUTIONAL: Well-developed, well-nourished female in no acute distress.  SKIN: No rash noted. Not diaphoretic. No erythema. No pallor. MUSCULOSKELETAL: Normal range of motion. No edema noted. NEUROLOGIC: Alert and oriented to person, place, and time. Normal muscle tone coordination. No cranial nerve deficit noted on observation. PSYCHIATRIC: Normal mood and affect. Normal behavior. Normal judgment and thought content. CARDIOVASCULAR: Normal heart rate noted RESPIRATORY: Effort and breath sounds normal, no problems with respiration noted ABDOMEN: No masses or other overt distention noted on observation. Diffuse tenderness all over abdomen and pelvis.   PELVIC: Deferred   Assessment and Plan:     1. Generalized abdominal pain (Primary) Unclear etiology of her pain, she reports having mostly upper abdominal pain.   She was worried about recurrence of her fibroids, education was given to her about her having a total hysterectomy in past, she no longer will have fibroids. Her ovaries remain in place, but this pattern of pain is not consistent with pain from her ovaries. Still offered her imaging; either pelvic ultrasound to look at her ovaries or a comprehensive CT scan of abdomen/pelvis to look for other etiology (and compare this to her 2023 scan). She declined this for now. She was told to follow up with her PCP for further evaluation.  Routine preventative health maintenance measures emphasized. Please refer to After Visit Summary for other counseling recommendations.   Return for any gynecologic concerns.    I spent 45 minutes dedicated to the care of this patient  including pre-visit review of records, face to face time with the patient discussing her conditions and treatments, post visit ordering of medications and appropriate tests or procedures, coordinating care and documenting this visit encounter.    GLORIS HUGGER, MD,  FACOG Obstetrician & Gynecologist, [facility] for [employer], [facility]

## 2024-01-29 DIAGNOSIS — M7918 Myalgia, other site: Secondary | ICD-10-CM | POA: Diagnosis not present

## 2024-01-29 DIAGNOSIS — M542 Cervicalgia: Secondary | ICD-10-CM | POA: Diagnosis not present

## 2024-01-29 DIAGNOSIS — M25511 Pain in right shoulder: Secondary | ICD-10-CM | POA: Diagnosis not present

## 2024-02-17 DIAGNOSIS — M25511 Pain in right shoulder: Secondary | ICD-10-CM | POA: Diagnosis not present

## 2024-02-17 DIAGNOSIS — M542 Cervicalgia: Secondary | ICD-10-CM | POA: Diagnosis not present

## 2024-03-07 ENCOUNTER — Encounter: Payer: Self-pay | Admitting: Radiology

## 2024-03-07 DIAGNOSIS — M25511 Pain in right shoulder: Secondary | ICD-10-CM | POA: Diagnosis not present

## 2024-03-18 DIAGNOSIS — S46011A Strain of muscle(s) and tendon(s) of the rotator cuff of right shoulder, initial encounter: Secondary | ICD-10-CM | POA: Diagnosis not present

## 2024-04-15 ENCOUNTER — Ambulatory Visit: Payer: Self-pay | Admitting: Nurse Practitioner

## 2024-04-15 ENCOUNTER — Ambulatory Visit: Admitting: Nurse Practitioner

## 2024-04-15 ENCOUNTER — Encounter: Payer: Self-pay | Admitting: Nurse Practitioner

## 2024-04-15 VITALS — BP 140/90 | HR 98 | Temp 99.2°F | Ht 68.0 in | Wt 261.4 lb

## 2024-04-15 DIAGNOSIS — R0981 Nasal congestion: Secondary | ICD-10-CM

## 2024-04-15 DIAGNOSIS — R051 Acute cough: Secondary | ICD-10-CM

## 2024-04-15 DIAGNOSIS — E6609 Other obesity due to excess calories: Secondary | ICD-10-CM

## 2024-04-15 DIAGNOSIS — E66812 Obesity, class 2: Secondary | ICD-10-CM

## 2024-04-15 DIAGNOSIS — I10 Essential (primary) hypertension: Secondary | ICD-10-CM

## 2024-04-15 DIAGNOSIS — J029 Acute pharyngitis, unspecified: Secondary | ICD-10-CM | POA: Diagnosis not present

## 2024-04-15 DIAGNOSIS — R7303 Prediabetes: Secondary | ICD-10-CM | POA: Diagnosis not present

## 2024-04-15 DIAGNOSIS — Z79899 Other long term (current) drug therapy: Secondary | ICD-10-CM | POA: Diagnosis not present

## 2024-04-15 DIAGNOSIS — Z2821 Immunization not carried out because of patient refusal: Secondary | ICD-10-CM

## 2024-04-15 DIAGNOSIS — Z6839 Body mass index (BMI) 39.0-39.9, adult: Secondary | ICD-10-CM

## 2024-04-15 DIAGNOSIS — M25511 Pain in right shoulder: Secondary | ICD-10-CM

## 2024-04-15 DIAGNOSIS — G8929 Other chronic pain: Secondary | ICD-10-CM

## 2024-04-15 LAB — POC SOFIA 2 FLU + SARS ANTIGEN FIA
Influenza A, POC: NEGATIVE
Influenza B, POC: NEGATIVE
SARS Coronavirus 2 Ag: NEGATIVE

## 2024-04-15 LAB — POCT RAPID STREP A (OFFICE): Rapid Strep A Screen: NEGATIVE

## 2024-04-15 MED ORDER — LORATADINE 10 MG PO TABS: 10.0000 mg | ORAL_TABLET | Freq: Every day | ORAL | 2 refills | Status: DC

## 2024-04-15 MED ORDER — AMOXICILLIN 875 MG PO TABS
875.0000 mg | ORAL_TABLET | Freq: Two times a day (BID) | ORAL | 0 refills | Status: AC
Start: 2024-04-15 — End: ?

## 2024-04-15 MED ORDER — FLUCONAZOLE 100 MG PO TABS: 100.0000 mg | ORAL_TABLET | Freq: Every day | ORAL | 0 refills | Status: AC

## 2024-04-15 NOTE — Telephone Encounter (Signed)
 FYI Only or Action Required?: FYI only for provider.  Patient was last seen in primary care on 10/30/2023 by Georgina Speaks, FNP.  Called Nurse Triage reporting Sore Throat.  Symptoms began several days ago.  Triage Disposition: See HCP Within 4 Hours (Or PCP Triage) (overriding See Physician Within 24 Hours)  Patient/caregiver understands and will follow disposition?: Yes              Copied from CRM #[id number]. Topic: Clinical - Red Word Triage >> Apr 15, 2024  8:30 AM [place] MATSU wrote: Red Word that prompted transfer to Nurse Triage: pain in head cough and coughing and really congestion and sore throat. Reason for Disposition  SEVERE coughing spells (e.g., whooping sound after coughing, vomiting after coughing)  Answer Assessment - Initial Assessment Questions Congestion Dry cough Sore throat- has improved from where it was Onset: Fri Denies fever (hasn't checked, felt warm) Denies difficulty breathing  Protocols used: Sore Throat-A-AH, Cough - Acute Productive-A-AH

## 2024-04-15 NOTE — Progress Notes (Signed)
 Mckenzie Anderson, CMA,acting as a Neurosurgeon for [name], FNP.,have documented all relevant documentation on the behalf of [name], FNP,as directed by  [name], FNP while in the presence of [name], FNP.  Subjective:  Patient ID: Mckenzie Anderson , female    DOB: [birth year] , 53 y.o.   MRN: [id number]  Chief Complaint  Patient presents with   URI    Patient presents today for cough, headache, congestion, sore throat since Friday. Patient reports she tried OTC medications.    Discussed the use of AI scribe software for clinical note transcription with the patient, who gave verbal consent to proceed.  History of Present Illness Mckenzie Anderson is a 53 year old female who presents with cold symptoms including cough, headache, congestion, and sore throat since Friday.  She has been experiencing cold symptoms since Friday, including a wet cough without expectoration, headache, congestion, and sore throat. She has not taken any over-the-counter medications like Theraflu or Mucinex, only herbal medications and vitamin D. She feels hot and cold intermittently but has not measured her temperature. Her symptoms have not improved or worsened.  She recalls exposure to mold from clothes stored in a plastic bag, which she discovered after moving them. She started feeling unwell shortly after this exposure. She has not performed any home remedies like warm salt water gargles for her sore throat.  She experiences pressure in her right ear and has an upcoming surgery scheduled for her right shoulder on November 3rd. She has a history of a 25% tear in her right shoulder, though she is uncertain if it involves the rotator cuff or a ligament.  She has not taken any medications like Claritin or Coricidin for her symptoms. She has a history of yeast infections with antibiotic use.  She has a history of taking phentermine for weight management. She has not been able to exercise regularly due to a  meniscus issue that limited her ability to walk. She reports difficulty eating enough due to heat at work, leading to decreased appetite and reliance on drinks during breaks. She struggles with maintaining protein intake and weight management.  She mentions a previous lab bill issue and is concerned about the cost of labs related to her blood pressure medication monitoring. She is aware of the need for regular kidney function tests due to her hypertension.   URI  This is a new problem. The current episode started in the past 7 days. The problem has been unchanged. There has been no fever. Associated symptoms include congestion, coughing (productive cough), headaches and a sore throat. Pertinent negatives include no chest pain, nausea or sinus pain.     Past Medical History:  Diagnosis Date   Asthma    Diverticulosis    Gastroesophageal reflux disease without esophagitis 07/09/2021   GERD (gastroesophageal reflux disease)    Gross hematuria    Heartburn    Hx of adenomatous colonic polyps 09/06/2021   Hypertension    Irritable bowel syndrome with constipation 06/14/2022   Lateral epicondylitis of right elbow 03/08/2022   Migraines    Peripheral neuritis 01/11/2018   Rupture of right distal biceps tendon 01/11/2018   Tear of medial meniscus of right knee, current 04/17/2023     Family History  Problem Relation Age of Onset   Hypertension Mother    Diabetes Mother    Gout Mother    Cancer Father    Kidney cancer Father    Seizures Maternal Aunt  Cancer Maternal Grandmother    Breast cancer Maternal Grandmother 85   Hypertension Maternal Grandfather    Obesity Maternal Grandfather    Diabetes Paternal Grandmother    Diabetes Paternal Grandfather    Cancer Other    Seizures Other        Grandmother's sister   Migraines Neg Hx    Bladder Cancer Neg Hx      Current Outpatient Medications:    amoxicillin  (AMOXIL ) 875 MG tablet, Take 1 tablet (875 mg total) by mouth 2 (two)  times daily., Disp: 14 tablet, Rfl: 0   Cholecalciferol (VITAMIN D3) 1.25 MG (50000 UT) CAPS, Take 50,000 Units by mouth every Monday., Disp: , Rfl:    DM-APAP-CPM (CORICIDIN HBP) 10-325-2 MG TABS, Take 1 tablet by mouth 3 (three) times daily as needed., Disp: 20 tablet, Rfl: 0   doxepin (SINEQUAN) 10 MG capsule, Take 20 mg by mouth at bedtime as needed (spasms.)., Disp: , Rfl:    fluconazole  (DIFLUCAN ) 100 MG tablet, Take 1 tablet (100 mg total) by mouth daily. Take 1 tablet by mouth now repeat in 5 days, Disp: 2 tablet, Rfl: 0   fluticasone  (FLONASE ) 50 MCG/ACT nasal spray, Place 1 spray into both nostrils daily as needed for allergies. , Disp: , Rfl:    ibuprofen  (ADVIL ) 800 MG tablet, TAKE 1 TABLET BY MOUTH EVERY 8 HOURS AS NEEDED. TAKE WITH FOOD TO PREVENT GI UPSET, Disp: 90 tablet, Rfl: 0   loratadine (CLARITIN) 10 MG tablet, Take 1 tablet (10 mg total) by mouth daily., Disp: 30 tablet, Rfl: 2   lubiprostone (AMITIZA) 24 MCG capsule, Take 24 mcg by mouth 2 (two) times daily as needed (constipation.)., Disp: , Rfl:    metoprolol  tartrate (LOPRESSOR ) 25 MG tablet, TAKE 1 TABLET BY MOUTH TWICE A DAY, Disp: 180 tablet, Rfl: 1   OIL OF OREGANO PO, Take 2 capsules by mouth every evening., Disp: , Rfl:    olmesartan  (BENICAR ) 20 MG tablet, TAKE 1 TABLET BY MOUTH EVERY DAY, Disp: 90 tablet, Rfl: 1   pantoprazole  (PROTONIX ) 40 MG tablet, Take 1 tablet (40 mg total) by mouth 2 (two) times daily. (Patient taking differently: Take 80 mg by mouth every evening.), Disp: 28 tablet, Rfl: 0   spironolactone  (ALDACTONE ) 25 MG tablet, Take 1 tablet (25 mg total) by mouth daily., Disp: 90 tablet, Rfl: 1   phentermine (ADIPEX-P) 37.5 MG tablet, Take 37.5 mg by mouth daily before breakfast. (Patient not taking: Reported on 04/15/2024), Disp: , Rfl:    Allergies  Allergen Reactions   Sulfa Antibiotics Hives and Itching     Review of Systems  Constitutional: Negative.   HENT:  Positive for congestion and sore  throat. Negative for sinus pain.   Eyes: Negative.   Respiratory:  Positive for cough (productive cough).   Cardiovascular: Negative.  Negative for chest pain, palpitations and leg swelling.  Gastrointestinal: Negative.  Negative for nausea.  Endocrine: Negative.   Genitourinary: Negative.   Musculoskeletal: Negative.   Skin: Negative.   Neurological:  Positive for headaches.  Psychiatric/Behavioral: Negative.       Today's Vitals   04/15/24 0932  BP: (!) 140/90  Pulse: 98  Temp: 99.2 F (37.3 C)  TempSrc: Oral  Weight: 261 lb 6.4 oz (118.6 kg)  Height: 5' 8 (1.727 m)  PainSc: 0-No pain   Body mass index is 39.75 kg/m.  Wt Readings from Last 3 Encounters:  04/15/24 261 lb 6.4 oz (118.6 kg)  01/17/24 258 lb (117  kg)  10/30/23 255 lb (115.7 kg)    Objective:  Physical Exam Vitals and nursing note reviewed.  Constitutional:      General: She is not in acute distress.    Appearance: Normal appearance. She is obese.  HENT:     Head: Normocephalic and atraumatic.     Right Ear: External ear normal. There is no impacted cerumen. Tympanic membrane is not erythematous.     Left Ear: Tympanic membrane, ear canal and external ear normal. There is no impacted cerumen.     Ears:     Comments: Erythematous canal Cardiovascular:     Rate and Rhythm: Normal rate and regular rhythm.     Pulses: Normal pulses.     Heart sounds: Normal heart sounds. No murmur heard. Pulmonary:     Effort: Pulmonary effort is normal. No respiratory distress.     Breath sounds: Normal breath sounds. No wheezing.  Musculoskeletal:        General: No swelling or tenderness. Normal range of motion.  Skin:    General: Skin is warm and dry.     Capillary Refill: Capillary refill takes less than 2 seconds.  Neurological:     General: No focal deficit present.     Mental Status: She is alert and oriented to person, place, and time.     Cranial Nerves: No cranial nerve deficit.     Motor: No weakness.          Assessment And Plan:  Acute cough Assessment & Plan: Negative strep, COVID, and flu tests suggest viral etiology. Possible bacterial involvement due to throat white spot and ear effusion. Mold exposure not a long-term concern. - Prescribe amoxicillin  1 tablet twice daily for 7 days. - Prescribe Diflucan  for potential yeast infection if symptoms develop. - Consider steroid if no improvement with antibiotics. - Advise Coricidin for congestion and cough. - Recheck blood pressure.  Orders: -     POC SOFIA 2 FLU + SARS ANTIGEN FIA -     Amoxicillin ; Take 1 tablet (875 mg total) by mouth 2 (two) times daily.  Dispense: 14 tablet; Refill: 0  Congestion of nasal sinus -     POC SOFIA 2 FLU + SARS ANTIGEN FIA -     Loratadine; Take 1 tablet (10 mg total) by mouth daily.  Dispense: 30 tablet; Refill: 2 -     Amoxicillin ; Take 1 tablet (875 mg total) by mouth 2 (two) times daily.  Dispense: 14 tablet; Refill: 0  Sore throat -     POCT rapid strep A -     Amoxicillin ; Take 1 tablet (875 mg total) by mouth 2 (two) times daily.  Dispense: 14 tablet; Refill: 0  COVID-19 vaccination declined  Influenza vaccination declined  Herpes zoster vaccination declined  Class 2 obesity due to excess calories with body mass index (BMI) of 39.0 to 39.9 in adult, unspecified whether serious comorbidity present Assessment & Plan: Weight management difficult due to limited intake and exercise. Previous phentermine use noted. Inadequate protein intake at work due to heat. - Advise increasing protein intake with non-dairy options like sandwich meat or grilled chicken. - Discuss potential use of clear protein drinks as meal replacements.   Essential hypertension Assessment & Plan: Blood pressure is better controlled, still elevated but improved with repeat. She is not feeling well today due to a cold  Orders: -     CMP14+EGFR  Prediabetes Assessment & Plan: A1c at 6.2 indicates prediabetes.  Discussed insurance  issues with medication coverage for weight management. - Monitor A1c levels regularly. - Discuss insurance coverage for weight management medications.  Orders: -     Hemoglobin A1c  Other long term (current) drug therapy -     CBC  Chronic right shoulder pain Assessment & Plan: Surgery scheduled for November 3rd. No current surgical clearance required, but advised confirmation with surgical team. - Confirm with surgical team if surgical clearance is needed. - Perform EKG and labs if surgical clearance is required.   Other orders -     Fluconazole ; Take 1 tablet (100 mg total) by mouth daily. Take 1 tablet by mouth now repeat in 5 days  Dispense: 2 tablet; Refill: 0     Return for keep same next.  Patient was given opportunity to ask questions. Patient verbalized understanding of the plan and was able to repeat key elements of the plan. All questions were answered to their satisfaction.    Mckenzie [name], FNP, have reviewed all documentation for this visit. The documentation on 04/15/24 for the exam, diagnosis, procedures, and orders are all accurate and complete.   IF YOU HAVE BEEN REFERRED TO A SPECIALIST, IT MAY TAKE 1-2 WEEKS TO SCHEDULE/PROCESS THE REFERRAL. IF YOU HAVE NOT HEARD FROM US /SPECIALIST IN TWO WEEKS, PLEASE GIVE US  A CALL AT [phone].

## 2024-04-16 LAB — CMP14+EGFR
ALT: 22 IU/L (ref 0–32)
AST: 23 IU/L (ref 0–40)
Albumin: 4.3 g/dL (ref 3.8–4.9)
Alkaline Phosphatase: 246 IU/L — ABNORMAL HIGH (ref 49–135)
BUN/Creatinine Ratio: 17 (ref 9–23)
BUN: 15 mg/dL (ref 6–24)
Bilirubin Total: 0.3 mg/dL (ref 0.0–1.2)
CO2: 23 mmol/L (ref 20–29)
Calcium: 9.9 mg/dL (ref 8.7–10.2)
Chloride: 100 mmol/L (ref 96–106)
Creatinine, Ser: 0.86 mg/dL (ref 0.57–1.00)
Globulin, Total: 2.8 g/dL (ref 1.5–4.5)
Glucose: 103 mg/dL — ABNORMAL HIGH (ref 70–99)
Potassium: 4.7 mmol/L (ref 3.5–5.2)
Sodium: 138 mmol/L (ref 134–144)
Total Protein: 7.1 g/dL (ref 6.0–8.5)
eGFR: 81 mL/min/1.73 (ref >59)

## 2024-04-16 LAB — CBC
Hematocrit: 42.6 % (ref 34.0–46.6)
Hemoglobin: 13.9 g/dL (ref 11.1–15.9)
MCH: 29.8 pg (ref 26.6–33.0)
MCHC: 32.6 g/dL (ref 31.5–35.7)
MCV: 91 fL (ref 79–97)
Platelets: 329 x10E3/uL (ref 150–450)
RBC: 4.67 x10E6/uL (ref 3.77–5.28)
RDW: 13.7 % (ref 11.7–15.4)
WBC: 7.4 x10E3/uL (ref 3.4–10.8)

## 2024-04-16 LAB — HEMOGLOBIN A1C
Est. average glucose Bld gHb Est-mCnc: 120 mg/dL
Hgb A1c MFr Bld: 5.8 % — ABNORMAL HIGH (ref 4.8–5.6)

## 2024-04-24 ENCOUNTER — Ambulatory Visit: Payer: Self-pay | Admitting: Nurse Practitioner

## 2024-04-24 DIAGNOSIS — R051 Acute cough: Secondary | ICD-10-CM | POA: Insufficient documentation

## 2024-04-24 DIAGNOSIS — R0981 Nasal congestion: Secondary | ICD-10-CM | POA: Insufficient documentation

## 2024-04-24 DIAGNOSIS — G8929 Other chronic pain: Secondary | ICD-10-CM | POA: Insufficient documentation

## 2024-04-24 DIAGNOSIS — E6609 Other obesity due to excess calories: Secondary | ICD-10-CM | POA: Insufficient documentation

## 2024-04-24 NOTE — Assessment & Plan Note (Signed)
 A1c at 6.2 indicates prediabetes. Discussed insurance issues with medication coverage for weight management. - Monitor A1c levels regularly. - Discuss insurance coverage for weight management medications.

## 2024-04-24 NOTE — Assessment & Plan Note (Signed)
 Surgery scheduled for November 3rd. No current surgical clearance required, but advised confirmation with surgical team. - Confirm with surgical team if surgical clearance is needed. - Perform EKG and labs if surgical clearance is required.

## 2024-04-24 NOTE — Assessment & Plan Note (Signed)
 Blood pressure is better controlled, still elevated but improved with repeat. She is not feeling well today due to a cold

## 2024-04-24 NOTE — Assessment & Plan Note (Signed)
 Negative strep, COVID, and flu tests suggest viral etiology. Possible bacterial involvement due to throat white spot and ear effusion. Mold exposure not a long-term concern. - Prescribe amoxicillin  1 tablet twice daily for 7 days. - Prescribe Diflucan  for potential yeast infection if symptoms develop. - Consider steroid if no improvement with antibiotics. - Advise Coricidin for congestion and cough. - Recheck blood pressure.

## 2024-04-24 NOTE — Assessment & Plan Note (Signed)
 Weight management difficult due to limited intake and exercise. Previous phentermine use noted. Inadequate protein intake at work due to heat. - Advise increasing protein intake with non-dairy options like sandwich meat or grilled chicken. - Discuss potential use of clear protein drinks as meal replacements.

## 2024-05-03 ENCOUNTER — Ambulatory Visit: Admission: EM | Admit: 2024-05-03 | Discharge: 2024-05-03 | Disposition: A | Discharge status: Home / Self Care

## 2024-05-03 DIAGNOSIS — W57XXXA Bitten or stung by nonvenomous insect and other nonvenomous arthropods, initial encounter: Secondary | ICD-10-CM

## 2024-05-03 DIAGNOSIS — S80862A Insect bite (nonvenomous), left lower leg, initial encounter: Secondary | ICD-10-CM

## 2024-05-03 DIAGNOSIS — R21 Rash and other nonspecific skin eruption: Secondary | ICD-10-CM

## 2024-05-03 MED ORDER — MUPIROCIN 2 % EX OINT: 1.0000 | TOPICAL_OINTMENT | Freq: Two times a day (BID) | CUTANEOUS | 0 refills | Status: AC

## 2024-05-03 NOTE — ED Triage Notes (Addendum)
 Pt being seen in UC for possible insect bite on the L lower leg. Pt reports noticing redness and rash yesterday. Pt reports warmth to area and soreness. Pt denies fevers and chills. Pt reports using hydrocortisone cream on it with no relief. Redness and warmth around site on LLE is noted upon assessment.

## 2024-05-03 NOTE — ED Provider Notes (Signed)
 [facility]    CSN: [id number] Arrival date & time: 05/03/24  0815      History   Chief Complaint Chief Complaint  Patient presents with   Insect Bite   Rash    HPI Mckenzie Anderson is a 53 y.o. female.   The history is provided by the patient.   Patient presents for possible insect bite to the left lower leg.  Patient states symptoms started over the past 24 hours.  She states that the area is red, swollen, and tender to touch.  She states that she has seen several spiders at her job.  She denies fever, chills, chest pain, abdominal pain, nausea, vomiting, diarrhea, or rash.  So far states she has been using hydrocortisone cream to the affected area.  Past Medical History:  Diagnosis Date   Asthma    Diverticulosis    Gastroesophageal reflux disease without esophagitis 07/09/2021   GERD (gastroesophageal reflux disease)    Gross hematuria    Heartburn    Hx of adenomatous colonic polyps 09/06/2021   Hypertension    Irritable bowel syndrome with constipation 06/14/2022   Lateral epicondylitis of right elbow 03/08/2022   Migraines    Peripheral neuritis 01/11/2018   Rupture of right distal biceps tendon 01/11/2018   Tear of medial meniscus of right knee, current 04/17/2023    Patient Active Problem List   Diagnosis Date Noted   Acute cough 04/24/2024   Congestion of nasal sinus 04/24/2024   Class 2 obesity due to excess calories with body mass index (BMI) of 39.0 to 39.9 in adult 04/24/2024   Chronic right shoulder pain 04/24/2024   Herpes zoster vaccination declined 10/30/2023   Prediabetes 10/30/2023   Essential hypertension 10/30/2023   Encounter for health maintenance examination 10/30/2023   Hiatal hernia 10/30/2023   s//p right knee arthroscopy 04/17/23 04/30/2023   Tear of medial meniscus of right knee, current 04/17/2023   Irritable bowel syndrome with constipation 06/14/2022   Lateral epicondylitis of right elbow 03/08/2022   Hx of  adenomatous colonic polyps 09/06/2021   Chronic constipation 08/30/2021   Chronic heartburn 08/30/2021   Abdominal pain 08/13/2021   Need for hepatitis B screening test 08/13/2021   Immune to hepatitis A 08/13/2021   Smoking 08/13/2021   Alkaline phosphatase elevation 08/13/2021   Gastroesophageal reflux disease without esophagitis 07/09/2021   Abdominal bloating 03/17/2021   Peripheral neuritis 01/11/2018   Migraine 01/08/2016   Muscle cramp 01/08/2016   HTN (hypertension) 01/08/2016    Past Surgical History:  Procedure Laterality Date   CARPAL TUNNEL RELEASE     CHOLECYSTECTOMY     KNEE ARTHROSCOPY WITH MEDIAL MENISECTOMY Right 04/17/2023   Procedure: KNEE ARTHROSCOPY WITH MEDIAL MENISCECTOMY;  Surgeon: Margrette Taft BRAVO, MD;  Location: [facility];  Service: Orthopedics;  Laterality: Right;   TOTAL VAGINAL HYSTERECTOMY  08/14/2011   Both ovaries remain in place    OB History   No obstetric history on file.      Home Medications    Prior to Admission medications   Medication Sig Start Date End Date Taking? Authorizing Provider  mupirocin ointment (BACTROBAN) 2 % Apply 1 Application topically 2 (two) times daily. 05/03/24  Yes Leath-Warren, Etta PARAS, NP  amoxicillin  (AMOXIL ) 875 MG tablet Take 1 tablet (875 mg total) by mouth 2 (two) times daily. Patient not taking: Reported on 05/03/2024 04/15/24   Moore, Janece, FNP  Cholecalciferol (VITAMIN D3) 1.25 MG (50000 UT) CAPS Take 50,000 Units by mouth  every Monday. Patient not taking: Reported on 05/03/2024 11/28/19   [provider]  DM-APAP-CPM (CORICIDIN HBP) 10-325-2 MG TABS Take 1 tablet by mouth 3 (three) times daily as needed. 05/24/23   Stuart Vernell Norris, PA-C  doxepin (SINEQUAN) 10 MG capsule Take 20 mg by mouth at bedtime as needed (spasms.). Patient not taking: Reported on 05/03/2024    [provider]  fluconazole  (DIFLUCAN ) 100 MG tablet Take 1 tablet (100 mg total) by mouth daily. Take 1  tablet by mouth now repeat in 5 days 04/15/24   Moore, Janece, FNP  fluticasone  (FLONASE ) 50 MCG/ACT nasal spray Place 1 spray into both nostrils daily as needed for allergies.  10/07/19   [provider]  gabapentin (NEURONTIN) 100 MG capsule Take 100 mg by mouth 2 (two) times daily.    [provider]  ibuprofen  (ADVIL ) 800 MG tablet TAKE 1 TABLET BY MOUTH EVERY 8 HOURS AS NEEDED. TAKE WITH FOOD TO PREVENT GI UPSET 10/15/23   Margrette Taft BRAVO, MD  loratadine (CLARITIN) 10 MG tablet Take 1 tablet (10 mg total) by mouth daily. 04/15/24 04/15/25  Georgina Speaks, FNP  lubiprostone (AMITIZA) 24 MCG capsule Take 24 mcg by mouth 2 (two) times daily as needed (constipation.). 09/04/22   [provider]  methocarbamol  (ROBAXIN ) 500 MG tablet Take 500 mg by mouth 1 day or 1 dose.    [provider]  metoprolol  tartrate (LOPRESSOR ) 25 MG tablet TAKE 1 TABLET BY MOUTH TWICE A DAY 11/05/23   Moore, Janece, FNP  OIL OF OREGANO PO Take 2 capsules by mouth every evening.    [provider]  olmesartan  (BENICAR ) 20 MG tablet TAKE 1 TABLET BY MOUTH EVERY DAY 11/05/23   Moore, Janece, FNP  pantoprazole  (PROTONIX ) 40 MG tablet Take 1 tablet (40 mg total) by mouth 2 (two) times daily. Patient taking differently: Take 40 mg by mouth daily. 02/13/22   Moore, Janece, FNP  phentermine (ADIPEX-P) 37.5 MG tablet Take 37.5 mg by mouth daily before breakfast. Patient not taking: Reported on 04/15/2024    [provider]  spironolactone  (ALDACTONE ) 25 MG tablet Take 1 tablet (25 mg total) by mouth daily. 10/26/22   Georgina Speaks, FNP    Family History Family History  Problem Relation Age of Onset   Hypertension Mother    Diabetes Mother    Gout Mother    Cancer Father    Kidney cancer Father    Seizures Maternal Aunt    Cancer Maternal Grandmother    Breast cancer Maternal Grandmother 12   Hypertension Maternal Grandfather    Obesity Maternal Grandfather    Diabetes  Paternal Grandmother    Diabetes Paternal Grandfather    Cancer Other    Seizures Other        Grandmother's sister   Migraines Neg Hx    Bladder Cancer Neg Hx     Social History Social History   Tobacco Use   Smoking status: Former    Current packs/day: 0.00    Average packs/day: 0.5 packs/day for 8.0 years (4.0 ttl pk-yrs)    Types: Cigarettes    Start date: 12/19/2013    Quit date: 12/19/2021    Years since quitting: 2.3   Smokeless tobacco: Never   Tobacco comments:    She stopped since her last visit   Vaping Use   Vaping status: Some Days  Substance Use Topics   Alcohol use: No   Drug use: No     Allergies  Sulfa antibiotics   Review of Systems Review of Systems Per HPI  Physical Exam Triage Vital Signs ED Triage Vitals [05/03/24 0823]  Encounter Vitals Group     BP (!) 176/107     Girls Systolic BP Percentile      Girls Diastolic BP Percentile      Boys Systolic BP Percentile      Boys Diastolic BP Percentile      Pulse Rate 98     Resp 19     Temp 98.4 F (36.9 C)     Temp Source Oral     SpO2 97 %     Weight      Height      Head Circumference      Peak Flow      Pain Score 7     Pain Loc      Pain Education      Exclude from Growth Chart    No data found.  Updated Vital Signs BP (!) 156/94 (BP Location: Left Arm)   Pulse 90   Temp 98.4 F (36.9 C) (Oral)   Resp 18   SpO2 99%   Visual Acuity Right Eye Distance:   Left Eye Distance:   Bilateral Distance:    Right Eye Near:   Left Eye Near:    Bilateral Near:     Physical Exam Vitals and nursing note reviewed.  Constitutional:      General: She is not in acute distress.    Appearance: Normal appearance.  HENT:     Head: Normocephalic.  Eyes:     Extraocular Movements: Extraocular movements intact.     Pupils: Pupils are equal, round, and reactive to light.  Cardiovascular:     Rate and Rhythm: Normal rate and regular rhythm.     Pulses: Normal pulses.     Heart sounds:  Normal heart sounds.  Pulmonary:     Effort: Pulmonary effort is normal. No respiratory distress.     Breath sounds: Normal breath sounds. No stridor. No wheezing, rhonchi or rales.  Musculoskeletal:     Cervical back: Normal range of motion.  Skin:    General: Skin is warm and dry.         Comments: Erythematous induration noted to the posterior left lower leg.  Area measures approximately 3 cm in diameter.  There is no oozing, fluctuance, or drainage present.  Neurological:     General: No focal deficit present.     Mental Status: She is alert and oriented to person, place, and time.  Psychiatric:        Mood and Affect: Mood normal.        Behavior: Behavior normal.      UC Treatments / Results  Labs (all labs ordered are listed, but only abnormal results are displayed) Labs Reviewed - No data to display  EKG   Radiology No results found.  Procedures Procedures (including critical care time)  Medications Ordered in UC Medications - No data to display  Initial Impression / Assessment and Plan / UC Course  I have reviewed the triage vital signs and the nursing notes.  Pertinent labs & imaging results that were available during my care of the patient were reviewed by me and considered in my medical decision making (see chart for details).  Patient with erythematous induration noted to the posterior left lower leg.  Difficult to determine the cause of the patient's symptoms.  She does have localized erythema noted to  the site.  Will treat with mupirocin ointment.  Repeat BP at discharge 156/94.  Supportive care recommendations were provided and discussed with the patient to include over-the-counter analgesics, the use of ice, and to clean the area with an antibacterial soap.  Discussed indications with patient regarding follow-up.  Patient is in agreement with this plan of care and verbalizes understanding.  All questions were answered.  Patient stable for discharge.  Final  Clinical Impressions(s) / UC Diagnoses   Final diagnoses:  Localized skin eruption  Insect bite of left lower leg, initial encounter     Discharge Instructions      Apply medication as prescribed. You may take over-the-counter Tylenol  as needed for pain or discomfort. Apply ice to the affected area to help with pain, swelling, and inflammation. Cleanse the affected area twice daily with an antibacterial soap such as Dial gold bar soap. Monitor the area for worsening.  Seek care if you develop foul-smelling drainage from the site, increased redness, swelling, or other concerns. Follow-up as needed.     ED Prescriptions     Medication Sig Dispense Auth. Provider   mupirocin ointment (BACTROBAN) 2 % Apply 1 Application topically 2 (two) times daily. 30 g Leath-Warren, Etta PARAS, NP      PDMP not reviewed this encounter.   Gilmer Etta PARAS, NP 05/03/24 [phone]

## 2024-05-03 NOTE — Discharge Instructions (Addendum)
 Apply medication as prescribed. You may take over-the-counter Tylenol  as needed for pain or discomfort. Apply ice to the affected area to help with pain, swelling, and inflammation. Cleanse the affected area twice daily with an antibacterial soap such as Dial gold bar soap. Monitor the area for worsening.  Seek care if you develop foul-smelling drainage from the site, increased redness, swelling, or other concerns. Follow-up as needed.

## 2024-05-19 ENCOUNTER — Encounter: Payer: Self-pay | Admitting: Radiology

## 2024-05-19 DIAGNOSIS — S46211A Strain of muscle, fascia and tendon of other parts of biceps, right arm, initial encounter: Secondary | ICD-10-CM | POA: Diagnosis not present

## 2024-05-19 DIAGNOSIS — X58XXXA Exposure to other specified factors, initial encounter: Secondary | ICD-10-CM | POA: Diagnosis not present

## 2024-05-19 DIAGNOSIS — S46011A Strain of muscle(s) and tendon(s) of the rotator cuff of right shoulder, initial encounter: Secondary | ICD-10-CM | POA: Diagnosis not present

## 2024-05-19 DIAGNOSIS — M7541 Impingement syndrome of right shoulder: Secondary | ICD-10-CM | POA: Diagnosis not present

## 2024-05-19 DIAGNOSIS — M19011 Primary osteoarthritis, right shoulder: Secondary | ICD-10-CM | POA: Diagnosis not present

## 2024-05-19 DIAGNOSIS — Y999 Unspecified external cause status: Secondary | ICD-10-CM | POA: Diagnosis not present

## 2024-05-19 DIAGNOSIS — S43431A Superior glenoid labrum lesion of right shoulder, initial encounter: Secondary | ICD-10-CM | POA: Diagnosis not present

## 2024-05-23 DIAGNOSIS — M25611 Stiffness of right shoulder, not elsewhere classified: Secondary | ICD-10-CM | POA: Diagnosis not present

## 2024-05-23 DIAGNOSIS — M25511 Pain in right shoulder: Secondary | ICD-10-CM | POA: Diagnosis not present

## 2024-05-28 DIAGNOSIS — M25611 Stiffness of right shoulder, not elsewhere classified: Secondary | ICD-10-CM | POA: Diagnosis not present

## 2024-05-28 DIAGNOSIS — M25511 Pain in right shoulder: Secondary | ICD-10-CM | POA: Diagnosis not present

## 2024-06-03 DIAGNOSIS — M25611 Stiffness of right shoulder, not elsewhere classified: Secondary | ICD-10-CM | POA: Diagnosis not present

## 2024-06-03 DIAGNOSIS — M25511 Pain in right shoulder: Secondary | ICD-10-CM | POA: Diagnosis not present

## 2024-06-10 DIAGNOSIS — M25511 Pain in right shoulder: Secondary | ICD-10-CM | POA: Diagnosis not present

## 2024-06-10 DIAGNOSIS — M25611 Stiffness of right shoulder, not elsewhere classified: Secondary | ICD-10-CM | POA: Diagnosis not present

## 2024-06-18 DIAGNOSIS — M25611 Stiffness of right shoulder, not elsewhere classified: Secondary | ICD-10-CM | POA: Diagnosis not present

## 2024-06-18 DIAGNOSIS — M25511 Pain in right shoulder: Secondary | ICD-10-CM | POA: Diagnosis not present

## 2024-06-24 DIAGNOSIS — M25611 Stiffness of right shoulder, not elsewhere classified: Secondary | ICD-10-CM | POA: Diagnosis not present

## 2024-06-24 DIAGNOSIS — M25511 Pain in right shoulder: Secondary | ICD-10-CM | POA: Diagnosis not present

## 2024-06-27 ENCOUNTER — Other Ambulatory Visit: Payer: Self-pay | Admitting: Nurse Practitioner

## 2024-06-27 DIAGNOSIS — R0981 Nasal congestion: Secondary | ICD-10-CM

## 2024-06-30 DIAGNOSIS — M25511 Pain in right shoulder: Secondary | ICD-10-CM | POA: Diagnosis not present

## 2024-06-30 DIAGNOSIS — M25611 Stiffness of right shoulder, not elsewhere classified: Secondary | ICD-10-CM | POA: Diagnosis not present

## 2024-07-07 DIAGNOSIS — M25511 Pain in right shoulder: Secondary | ICD-10-CM | POA: Diagnosis not present

## 2024-07-07 DIAGNOSIS — M25611 Stiffness of right shoulder, not elsewhere classified: Secondary | ICD-10-CM | POA: Diagnosis not present

## 2024-11-03 ENCOUNTER — Encounter: Payer: Self-pay | Admitting: Nurse Practitioner
